# Patient Record
Sex: Female | Born: 1950
Health system: Southern US, Community
[De-identification: ages and names within clinical notes are randomized; demographics above are authoritative.]

## PROBLEM LIST (undated history)

## (undated) DIAGNOSIS — M1990 Unspecified osteoarthritis, unspecified site: Secondary | ICD-10-CM

## (undated) DIAGNOSIS — T8859XA Other complications of anesthesia, initial encounter: Secondary | ICD-10-CM

## (undated) DIAGNOSIS — I10 Essential (primary) hypertension: Secondary | ICD-10-CM

## (undated) DIAGNOSIS — Z9889 Other specified postprocedural states: Secondary | ICD-10-CM

## (undated) DIAGNOSIS — Z8489 Family history of other specified conditions: Secondary | ICD-10-CM

## (undated) DIAGNOSIS — T4145XA Adverse effect of unspecified anesthetic, initial encounter: Secondary | ICD-10-CM

## (undated) DIAGNOSIS — R112 Nausea with vomiting, unspecified: Secondary | ICD-10-CM

## (undated) HISTORY — PX: TUBAL LIGATION: SHX77

## (undated) HISTORY — DX: Essential (primary) hypertension: I10

---

## 1978-12-04 HISTORY — PX: OTHER SURGICAL HISTORY: SHX169

## 1997-11-03 ENCOUNTER — Other Ambulatory Visit: Admission: RE | Admit: 1997-11-03 | Discharge: 1997-11-03 | Payer: Self-pay | Admitting: Gynecology

## 1999-02-09 ENCOUNTER — Other Ambulatory Visit: Admission: RE | Admit: 1999-02-09 | Discharge: 1999-02-09 | Payer: Self-pay | Admitting: Gynecology

## 2002-09-23 ENCOUNTER — Other Ambulatory Visit: Admission: RE | Admit: 2002-09-23 | Discharge: 2002-09-23 | Payer: Self-pay | Admitting: Family Medicine

## 2003-12-02 ENCOUNTER — Other Ambulatory Visit: Admission: RE | Admit: 2003-12-02 | Discharge: 2003-12-02 | Payer: Self-pay | Admitting: Family Medicine

## 2003-12-24 ENCOUNTER — Encounter: Admission: RE | Admit: 2003-12-24 | Discharge: 2003-12-24 | Payer: Self-pay | Admitting: Family Medicine

## 2005-05-24 ENCOUNTER — Ambulatory Visit: Payer: Self-pay | Admitting: Family Medicine

## 2005-05-24 ENCOUNTER — Encounter: Payer: Self-pay | Admitting: Family Medicine

## 2005-05-24 ENCOUNTER — Other Ambulatory Visit: Admission: RE | Admit: 2005-05-24 | Discharge: 2005-05-24 | Payer: Self-pay | Admitting: Family Medicine

## 2005-06-02 ENCOUNTER — Ambulatory Visit: Payer: Self-pay | Admitting: Family Medicine

## 2006-09-22 DIAGNOSIS — I10 Essential (primary) hypertension: Secondary | ICD-10-CM | POA: Insufficient documentation

## 2006-09-27 ENCOUNTER — Ambulatory Visit: Payer: Self-pay | Admitting: Family Medicine

## 2006-09-27 DIAGNOSIS — M1710 Unilateral primary osteoarthritis, unspecified knee: Secondary | ICD-10-CM

## 2006-09-27 DIAGNOSIS — IMO0002 Reserved for concepts with insufficient information to code with codable children: Secondary | ICD-10-CM | POA: Insufficient documentation

## 2006-09-28 LAB — CONVERTED CEMR LAB
AST: 24 units/L (ref 0–37)
Albumin: 3.5 g/dL (ref 3.5–5.2)
Basophils Absolute: 0 10*3/uL (ref 0.0–0.1)
Basophils Relative: 0.4 % (ref 0.0–1.0)
CO2: 34 meq/L — ABNORMAL HIGH (ref 19–32)
Eosinophils Relative: 2.6 % (ref 0.0–5.0)
GFR calc Af Amer: 111 mL/min
Glucose, Bld: 99 mg/dL (ref 70–99)
HCT: 39.2 % (ref 36.0–46.0)
LDL Cholesterol: 111 mg/dL — ABNORMAL HIGH (ref 0–99)
MCHC: 33.9 g/dL (ref 30.0–36.0)
MCV: 90.2 fL (ref 78.0–100.0)
Monocytes Absolute: 0.4 10*3/uL (ref 0.2–0.7)
Monocytes Relative: 7.9 % (ref 3.0–11.0)
Neutro Abs: 3.2 10*3/uL (ref 1.4–7.7)
Potassium: 3.7 meq/L (ref 3.5–5.1)
Sodium: 145 meq/L (ref 135–145)
Total CHOL/HDL Ratio: 3.6
Total Protein: 6.8 g/dL (ref 6.0–8.3)
VLDL: 10 mg/dL (ref 0–40)
WBC: 5.5 10*3/uL (ref 4.5–10.5)

## 2006-09-29 ENCOUNTER — Encounter (INDEPENDENT_AMBULATORY_CARE_PROVIDER_SITE_OTHER): Payer: Self-pay | Admitting: *Deleted

## 2006-12-15 ENCOUNTER — Encounter: Payer: Self-pay | Admitting: Family Medicine

## 2006-12-27 ENCOUNTER — Encounter (INDEPENDENT_AMBULATORY_CARE_PROVIDER_SITE_OTHER): Payer: Self-pay | Admitting: *Deleted

## 2007-01-05 ENCOUNTER — Encounter: Payer: Self-pay | Admitting: Family Medicine

## 2007-10-29 ENCOUNTER — Encounter: Payer: Self-pay | Admitting: Family Medicine

## 2008-01-09 ENCOUNTER — Ambulatory Visit: Payer: Self-pay | Admitting: Family Medicine

## 2008-02-11 ENCOUNTER — Other Ambulatory Visit: Admission: RE | Admit: 2008-02-11 | Discharge: 2008-02-11 | Payer: Self-pay | Admitting: Family Medicine

## 2008-02-11 ENCOUNTER — Encounter: Payer: Self-pay | Admitting: Family Medicine

## 2008-02-11 ENCOUNTER — Ambulatory Visit: Payer: Self-pay | Admitting: Family Medicine

## 2008-02-11 DIAGNOSIS — Z780 Asymptomatic menopausal state: Secondary | ICD-10-CM | POA: Insufficient documentation

## 2008-02-11 DIAGNOSIS — L570 Actinic keratosis: Secondary | ICD-10-CM | POA: Insufficient documentation

## 2008-02-11 LAB — CONVERTED CEMR LAB
Bilirubin Urine: NEGATIVE
Ketones, urine, test strip: NEGATIVE
Protein, U semiquant: NEGATIVE
Specific Gravity, Urine: 1.01

## 2008-02-13 ENCOUNTER — Encounter (INDEPENDENT_AMBULATORY_CARE_PROVIDER_SITE_OTHER): Payer: Self-pay | Admitting: *Deleted

## 2008-02-15 ENCOUNTER — Encounter (INDEPENDENT_AMBULATORY_CARE_PROVIDER_SITE_OTHER): Payer: Self-pay | Admitting: *Deleted

## 2008-02-17 LAB — CONVERTED CEMR LAB
Alkaline Phosphatase: 58 units/L (ref 39–117)
Basophils Absolute: 0.1 10*3/uL (ref 0.0–0.1)
CO2: 34 meq/L — ABNORMAL HIGH (ref 19–32)
Calcium: 9.2 mg/dL (ref 8.4–10.5)
Cholesterol: 155 mg/dL (ref 0–200)
Creatinine, Ser: 0.7 mg/dL (ref 0.4–1.2)
Eosinophils Absolute: 0.2 10*3/uL (ref 0.0–0.7)
Eosinophils Relative: 2.2 % (ref 0.0–5.0)
GFR calc non Af Amer: 91 mL/min
Glucose, Bld: 99 mg/dL (ref 70–99)
HCT: 39.3 % (ref 36.0–46.0)
Lymphocytes Relative: 19.8 % (ref 12.0–46.0)
MCHC: 34.3 g/dL (ref 30.0–36.0)
MCV: 93.2 fL (ref 78.0–100.0)
Monocytes Relative: 3.5 % (ref 3.0–12.0)
Neutro Abs: 5.3 10*3/uL (ref 1.4–7.7)
Potassium: 3.9 meq/L (ref 3.5–5.1)
RDW: 11.6 % (ref 11.5–14.6)
Sodium: 143 meq/L (ref 135–145)
Total CHOL/HDL Ratio: 3.5
Total Protein: 7 g/dL (ref 6.0–8.3)
VLDL: 11 mg/dL (ref 0–40)
WBC: 7.3 10*3/uL (ref 4.5–10.5)

## 2008-02-18 ENCOUNTER — Encounter (INDEPENDENT_AMBULATORY_CARE_PROVIDER_SITE_OTHER): Payer: Self-pay | Admitting: *Deleted

## 2008-03-12 ENCOUNTER — Encounter: Admission: RE | Admit: 2008-03-12 | Discharge: 2008-03-12 | Payer: Self-pay | Admitting: Family Medicine

## 2008-03-17 ENCOUNTER — Encounter (INDEPENDENT_AMBULATORY_CARE_PROVIDER_SITE_OTHER): Payer: Self-pay | Admitting: *Deleted

## 2008-04-11 ENCOUNTER — Telehealth (INDEPENDENT_AMBULATORY_CARE_PROVIDER_SITE_OTHER): Payer: Self-pay | Admitting: *Deleted

## 2009-03-11 ENCOUNTER — Ambulatory Visit: Payer: Self-pay | Admitting: Family Medicine

## 2009-05-13 ENCOUNTER — Ambulatory Visit: Payer: Self-pay | Admitting: Family Medicine

## 2009-05-13 DIAGNOSIS — E559 Vitamin D deficiency, unspecified: Secondary | ICD-10-CM | POA: Insufficient documentation

## 2009-05-18 ENCOUNTER — Ambulatory Visit: Payer: Self-pay | Admitting: Family Medicine

## 2009-05-18 LAB — CONVERTED CEMR LAB
AST: 20 units/L (ref 0–37)
Basophils Absolute: 0 10*3/uL (ref 0.0–0.1)
Basophils Relative: 0 % (ref 0.0–3.0)
Creatinine, Ser: 0.7 mg/dL (ref 0.4–1.2)
Eosinophils Absolute: 0.2 10*3/uL (ref 0.0–0.7)
Eosinophils Relative: 3.4 % (ref 0.0–5.0)
Glucose, Bld: 101 mg/dL — ABNORMAL HIGH (ref 70–99)
HCT: 42.4 % (ref 36.0–46.0)
LDL Cholesterol: 122 mg/dL — ABNORMAL HIGH (ref 0–99)
Lymphs Abs: 1.7 10*3/uL (ref 0.7–4.0)
MCV: 92.4 fL (ref 78.0–100.0)
Neutro Abs: 2.7 10*3/uL (ref 1.4–7.7)
RBC: 4.58 M/uL (ref 3.87–5.11)
Sodium: 140 meq/L (ref 135–145)
Total Bilirubin: 0.5 mg/dL (ref 0.3–1.2)
Total Protein: 7.5 g/dL (ref 6.0–8.3)
VLDL: 12.2 mg/dL (ref 0.0–40.0)

## 2010-03-09 ENCOUNTER — Ambulatory Visit: Payer: Self-pay | Admitting: Family Medicine

## 2010-04-19 ENCOUNTER — Encounter: Payer: Self-pay | Admitting: Family Medicine

## 2010-04-25 ENCOUNTER — Encounter: Payer: Self-pay | Admitting: Family Medicine

## 2010-05-04 NOTE — Assessment & Plan Note (Signed)
Summary: FLU SHOT//PH  Nurse Visit  CC: Flu shot./kb   Allergies: No Known Drug Allergies  Orders Added: 1)  Admin 1st Vaccine [90471] 2)  Flu Vaccine 74yrs + [16109]         Flu Vaccine Consent Questions     Do you have a history of severe allergic reactions to this vaccine? no    Any prior history of allergic reactions to egg and/or gelatin? no    Do you have a sensitivity to the preservative Thimersol? no    Do you have a past history of Guillan-Barre Syndrome? no    Do you currently have an acute febrile illness? no    Have you ever had a severe reaction to latex? no    Vaccine information given and explained to patient? yes    Are you currently pregnant? no    Lot Number:[id number]   Exp Date:10/02/2010   Site Given  Left Deltoid IMu

## 2010-05-04 NOTE — Assessment & Plan Note (Signed)
Summary: cpx/ns/kdc   Vital Signs:  Patient profile:   60 year old female Height:      65 inches Weight:      181 pounds BMI:     30.23 Temp:     98.2 degrees F oral Pulse rate:   62 / minute Pulse rhythm:   regular BP sitting:   122 / 80  (left arm) Cuff size:   regular  Vitals Entered By: Army Fossa CMA (May 13, 2009 8:35 AM) CC: CPX, no pap, pt is fasting    History of Present Illness: Pt here for cpe and labs.   No pap-- pt had pap last year and wants to wait a year.    Hypertension follow-up      This is a 60 year old woman who presents for Hypertension follow-up.  The patient denies lightheadedness, urinary frequency, headaches, edema, impotence, rash, and fatigue.  The patient denies the following associated symptoms: chest pain, chest pressure, exercise intolerance, dyspnea, palpitations, syncope, leg edema, and pedal edema.  Compliance with medications (by patient report) has been near 100%.  The patient reports that dietary compliance has been good.  The patient reports exercising 3-4X per week.  Adjunctive measures currently used by the patient include salt restriction.    Preventive Screening-Counseling & Management  Alcohol-Tobacco     Alcohol drinks/day: 0     Smoking Status: never     Passive Smoke Exposure: no  Caffeine-Diet-Exercise     Caffeine use/day: 0     Does Patient Exercise: yes     Type of exercise: walk     Times/week: 5- when weather is good  Hep-HIV-STD-Contraception     HIV Risk: no     Dental Visit-last 6 months no     Dental Care Counseling: no ins     SBE monthly: no     SBE Education/Counseling: to perform regular SBE  Safety-Violence-Falls     Seat Belt Use: 100      Sexual History:  currently monogamous and married.    Current Medications (verified): 1)  Hydrochlorothiazide 25 Mg  Tabs (Hydrochlorothiazide) .Marland Kitchen.. 1 By Mouth Once Daily 2)  Calcium .Marland Kitchen.. 1 By Mouth Two Times A Day 3)  Basa .Marland Kitchen.. 1 By Mouth Qd 4)  Potassium  .Marland Kitchen.. 1 By Mouth Qd 5)  Fish Oil 1000 Mg Caps (Omega-3 Fatty Acids) .Marland Kitchen.. 1 By Mouth Daily 6)  Baby Aspirin 81 Mg Chew (Aspirin) .Marland Kitchen.. 1 By Mouth Daily 7)  Vitamin D 1000 Unit Tabs (Cholecalciferol) .Marland Kitchen.. 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 Family History Hypertension Family History Other cancer-Breast Family History of Stroke F 1st degree relative <60 Family History Thyroid disease Family History of CAD Female 1st degree relative <60 Family  History of Colon CA 1st degree relative <60  Social History: Reviewed history from 02/11/2008 and no changes required. 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 Eyes:  Denies blurring, discharge, double vision, eye irritation, eye pain, halos, itching,  light sensitivity, red eye, vision loss-1 eye, and vision loss-both eyes; optho- due. ENT:  Denies decreased hearing, difficulty swallowing, ear discharge, earache, hoarseness, nasal congestion, nosebleeds, postnasal drainage, ringing in ears, sinus pressure, and sore throat. CV:  Denies bluish discoloration of lips or nails, chest pain or discomfort, difficulty breathing at night, difficulty breathing while lying down, fainting, fatigue, leg cramps with exertion, lightheadness, near fainting, palpitations, shortness of breath with exertion, swelling of feet, swelling of hands, and weight gain. Resp:  Denies chest discomfort, chest pain with inspiration, cough, coughing up blood, excessive snoring, hypersomnolence, morning headaches, pleuritic, shortness of breath, sputum productive, and wheezing. GI:  Denies abdominal pain, bloody stools, change in bowel habits, constipation, dark tarry stools, diarrhea, excessive appetite, gas, hemorrhoids, indigestion, loss of appetite, nausea, vomiting, vomiting blood, and yellowish skin color. GU:  Denies abnormal vaginal bleeding, decreased libido, discharge, dysuria, genital sores, hematuria, incontinence, nocturia, urinary frequency, and urinary hesitancy. MS:  Denies joint pain, joint redness, joint swelling, loss of strength, low back pain, mid back pain, muscle aches, muscle , cramps, muscle weakness, stiffness, and thoracic pain. Derm:  Denies changes in color of skin, changes in nail beds, dryness, excessive perspiration, flushing, hair loss, insect bite(s), itching, lesion(s), poor wound healing, and rash. Neuro:  Denies brief paralysis, difficulty with concentration, disturbances in coordination, falling down, headaches, inability to speak, memory loss, numbness, poor balance, seizures, sensation of room spinning, tingling, tremors, visual disturbances, and weakness. Psych:  Denies alternate hallucination ( auditory/visual), anxiety, depression, easily  angered, easily tearful, irritability, mental problems, panic attacks, sense of great danger, suicidal thoughts/plans, thoughts of violence, unusual visions or sounds, and thoughts /plans of harming others. Endo:  Denies cold intolerance, excessive hunger, excessive thirst, excessive urination, heat intolerance, polyuria, and weight change. Heme:  Denies abnormal bruising, bleeding, enlarge lymph nodes, fevers, pallor, and skin discoloration. Allergy:  Denies hives or rash, itching eyes, persistent infections, seasonal allergies, and sneezing.  Physical Exam  General:  Well-developed,well-nourished,in no acute distress; alert,appropriate and cooperative throughout examination Head:  Normocephalic and atraumatic without obvious abnormalities. No apparent alopecia or balding. Eyes:  vision grossly intact, pupils equal, pupils round, pupils reactive to light, and no injection.   Ears:  External ear exam shows no significant lesions or deformities.  Otoscopic examination reveals clear canals, tympanic membranes are intact bilaterally without bulging, retraction, inflammation or discharge. Hearing is grossly normal bilaterally. Nose:  External nasal examination shows no deformity or inflammation. Nasal mucosa are pink and moist without lesions or exudates. Mouth:  Oral mucosa and oropharynx without lesions or exudates.  Teeth in good repair. Neck:  No deformities, masses, or tenderness noted. Breasts:  No mass, nodules, thickening, tenderness, bulging, retraction, inflamation, nipple discharge or skin changes noted.   Lungs:  Normal respiratory effort, chest expands symmetrically. Lungs are clear to auscultation, no crackles or wheezes. Heart:  normal rate and no murmur.   Abdomen:  Bowel sounds positive,abdomen soft  and non-tender without masses, organomegaly or hernias noted. Rectal:  refused Genitalia:  refused Msk:  normal ROM, no joint tenderness, no joint swelling, no joint warmth, no redness  over joints, no joint deformities, no joint instability, and no crepitation.   Pulses:  R posterior tibial normal, R dorsalis pedis normal, R carotid normal, L posterior tibial normal, L dorsalis pedis normal, and L carotid normal.   Extremities:  No clubbing, cyanosis, edema, or deformity noted with normal full range of motion of all joints.   Neurologic:  No cranial nerve deficits noted. Station and gait are normal. Plantar reflexes are down-going bilaterally. DTRs are symmetrical throughout. Sensory, motor and coordinative functions appear intact. Skin:  Intact without suspicious lesions or rashes Cervical Nodes:  No lymphadenopathy noted Axillary Nodes:  No palpable lymphadenopathy Psych:  Cognition and judgment appear intact. Alert and cooperative with normal attention span and concentration. No apparent delusions, illusions, hallucinations   Impression & Recommendations:  Problem # 1:  PREVENTIVE HEALTH CARE (ICD-V70.0)  bmd refused because ins will not pay for it [facility]  Orders: Venipuncture (98119) TLB-Lipid Panel (80061-LIPID) TLB-BMP (Basic Metabolic Panel-BMET) (80048-METABOL) TLB-CBC Platelet - w/Differential (85025-CBCD) TLB-Hepatic/Liver Function Pnl (80076-HEPATIC) EKG w/ Interpretation (93000)  Problem # 2:  UNSPECIFIED VITAMIN D DEFICIENCY (ICD-268.9) pt refusing vita D level because her ins did not pay for it Orders: Venipuncture (14782) TLB-Lipid Panel (80061-LIPID) TLB-BMP (Basic Metabolic Panel-BMET) (80048-METABOL) TLB-CBC Platelet - w/Differential (85025-CBCD) TLB-Hepatic/Liver Function Pnl (80076-HEPATIC) EKG w/ Interpretation (93000)  Problem # 3:  HYPERTENSION (ICD-401.9)  Her updated medication list for this problem includes:    Hydrochlorothiazide 25 Mg Tabs (Hydrochlorothiazide) .Marland Kitchen... 1 by mouth once daily  Orders: Venipuncture (95621) TLB-Lipid Panel (80061-LIPID) TLB-BMP (Basic Metabolic Panel-BMET) (80048-METABOL) TLB-CBC Platelet -  w/Differential (85025-CBCD) TLB-Hepatic/Liver Function Pnl (80076-HEPATIC) EKG w/ Interpretation (93000)  BP today: 122/80 Prior BP: 130/78 (02/11/2008)  Labs Reviewed: K+: 3.9 (02/11/2008) Creat: : 0.7 (02/11/2008)   Chol: 155 (02/11/2008)   HDL: 44.3 (02/11/2008)   LDL: 100 (02/11/2008)   TG: 53 (02/11/2008)  Problem # 4:  POSTMENOPAUSAL STATUS (ICD-V49.81)  Orders: EKG w/ Interpretation (93000)  Complete Medication List: 1)  Hydrochlorothiazide 25 Mg Tabs (Hydrochlorothiazide) .Marland Kitchen.. 1 by mouth once daily 2)  Calcium  .Marland KitchenMarland Kitchen. 1 by mouth two times a day 3)  Basa  .Marland Kitchen.. 1 by mouth qd 4)  Potassium  .Marland KitchenMarland Kitchen. 1 by mouth qd 5)  Fish Oil 1000 Mg Caps (Omega-3 fatty acids) .Marland Kitchen.. 1 by mouth daily 6)  Baby Aspirin 81 Mg Chew (Aspirin) .Marland Kitchen.. 1 by mouth daily 7)  Vitamin D 1000 Unit Tabs (Cholecalciferol) .Marland Kitchen.. 1 by mouth daily Prescriptions: HYDROCHLOROTHIAZIDE 25 MG  TABS (HYDROCHLOROTHIAZIDE) 1 by mouth once daily  #100 x 3   Entered and Authorized by:   Loreen Freud DO   Signed by:   Loreen Freud DO on 05/13/2009   Method used:   Electronically to        [facility] (retail)       [address]       [place], [place]  [address]       Ph: [phone]       Fax: [phone]   RxID:   [id number]    EKG  Procedure date:  05/13/2009  Findings:      Normal sinus rhythm with rate of:  71 bpm      Colonoscopy Next Due:  Refused Last PAP:  NEGATIVE FOR  INTRAEPITHELIAL LESIONS OR MALIGNANCY. (02/11/2008 12:00:00 AM) PAP Next Due:  Refused  Appended Document: cpx/ns/kdc  Laboratory Results   Urine Tests   Date/Time Reported: May 13, 2009 9:30 AM   Routine Urinalysis   Color: yellow Appearance: Clear Glucose: negative   (Normal Range: Negative) Bilirubin: negative   (Normal Range: Negative) Ketone: negative   (Normal Range: Negative) Spec. Gravity: 1.020   (Normal Range: 1.003-1.035) Blood: negative   (Normal Range: Negative) pH: 6.0    (Normal Range: 5.0-8.0) Protein: negative   (Normal Range: Negative) Urobilinogen: negative   (Normal Range: 0-1) Nitrite: negative   (Normal Range: Negative) Leukocyte Esterace: negative   (Normal Range: Negative)    Comments: Floydene Flock  May 13, 2009 9:30 AM

## 2010-05-12 NOTE — Letter (Signed)
Summary: Patient Overdue for Mammogram/[employer]  Patient Overdue for Mammogram/[employer]   Imported By: Lanelle Bal 05/03/2010 11:21:03  _____________________________________________________________________  External Attachment:    Type:   Image     Comment:   External Document  Appended Document: Patient Overdue for Mammogram/[employer] does pt have cpe scheduled?  due in Feb Let pt know ins co sent [place] letter saying her mammo was overdue  Appended Document: Patient Overdue for Mammogram/[employer] pt aware and stated she would call and schedule

## 2010-07-22 ENCOUNTER — Other Ambulatory Visit: Payer: Self-pay

## 2010-07-22 MED ORDER — HYDROCHLOROTHIAZIDE 25 MG PO TABS: 25.0000 mg | ORAL_TABLET | Freq: Every day | ORAL | Status: DC

## 2010-11-08 ENCOUNTER — Other Ambulatory Visit: Payer: Self-pay | Admitting: Family Medicine

## 2010-11-08 DIAGNOSIS — Z1231 Encounter for screening mammogram for malignant neoplasm of breast: Secondary | ICD-10-CM

## 2010-11-09 MED ORDER — HYDROCHLOROTHIAZIDE 25 MG PO TABS: 25.0000 mg | ORAL_TABLET | Freq: Every day | ORAL | Status: DC

## 2010-11-09 NOTE — Telephone Encounter (Signed)
CPX scheduled    KP

## 2010-12-02 ENCOUNTER — Ambulatory Visit
Admission: RE | Admit: 2010-12-02 | Discharge: 2010-12-02 | Disposition: A | Discharge status: Home / Self Care | Payer: Commercial Indemnity | Source: Ambulatory Visit | Attending: Family Medicine | Admitting: Family Medicine

## 2010-12-02 DIAGNOSIS — Z1231 Encounter for screening mammogram for malignant neoplasm of breast: Secondary | ICD-10-CM

## 2010-12-21 ENCOUNTER — Other Ambulatory Visit (HOSPITAL_COMMUNITY)
Admission: RE | Admit: 2010-12-21 | Discharge: 2010-12-21 | Disposition: A | Discharge status: Home / Self Care | Payer: Commercial Indemnity | Source: Ambulatory Visit | Attending: Family Medicine | Admitting: Family Medicine

## 2010-12-21 ENCOUNTER — Ambulatory Visit (INDEPENDENT_AMBULATORY_CARE_PROVIDER_SITE_OTHER): Payer: Commercial Indemnity | Admitting: Family Medicine

## 2010-12-21 ENCOUNTER — Encounter: Payer: Self-pay | Admitting: Family Medicine

## 2010-12-21 VITALS — BP 144/82 | HR 75 | Temp 98.7°F | Wt 183.0 lb

## 2010-12-21 DIAGNOSIS — Z0000 Encounter for general adult medical examination without abnormal findings: Secondary | ICD-10-CM

## 2010-12-21 DIAGNOSIS — I10 Essential (primary) hypertension: Secondary | ICD-10-CM

## 2010-12-21 DIAGNOSIS — Z01419 Encounter for gynecological examination (general) (routine) without abnormal findings: Secondary | ICD-10-CM | POA: Insufficient documentation

## 2010-12-21 LAB — CBC WITH DIFFERENTIAL/PLATELET
Basophils Absolute: 0 10*3/uL (ref 0.0–0.1)
Basophils Relative: 0.4 % (ref 0.0–3.0)
Eosinophils Absolute: 0.2 10*3/uL (ref 0.0–0.7)
Eosinophils Relative: 3.3 % (ref 0.0–5.0)
HCT: 39.3 % (ref 36.0–46.0)
Lymphocytes Relative: 31.1 % (ref 12.0–46.0)
MCHC: 33.3 g/dL (ref 30.0–36.0)
Monocytes Absolute: 0.5 10*3/uL (ref 0.1–1.0)
Monocytes Relative: 8.4 % (ref 3.0–12.0)
Neutrophils Relative %: 56.8 % (ref 43.0–77.0)
Platelets: 172 10*3/uL (ref 150.0–400.0)
RDW: 13.2 % (ref 11.5–14.6)
WBC: 6 10*3/uL (ref 4.5–10.5)

## 2010-12-21 LAB — HEPATIC FUNCTION PANEL
ALT: 14 U/L (ref 0–35)
Albumin: 3.7 g/dL (ref 3.5–5.2)
Total Protein: 7 g/dL (ref 6.0–8.3)

## 2010-12-21 LAB — POCT URINALYSIS DIPSTICK
Ketones, UA: NEGATIVE
Leukocytes, UA: NEGATIVE
pH, UA: 8

## 2010-12-21 LAB — TSH: TSH: 1.31 u[IU]/mL (ref 0.35–5.50)

## 2010-12-21 LAB — BASIC METABOLIC PANEL
Creatinine, Ser: 0.7 mg/dL (ref 0.4–1.2)
Glucose, Bld: 103 mg/dL — ABNORMAL HIGH (ref 70–99)

## 2010-12-21 LAB — LIPID PANEL: Total CHOL/HDL Ratio: 3

## 2010-12-21 NOTE — Progress Notes (Signed)
Subjective:     Mindy Anderson is a 60 y.o. female and is here for a comprehensive physical exam. The patient reports no problems.  History   Social History  . Marital Status: Single    Spouse Name: N/A    Number of Children: N/A  . Years of Education: N/A   Occupational History  . Not on file.   Social History Main Topics  . Smoking status: Never Smoker   . Smokeless tobacco: Never Used  . Alcohol Use: Not on file  . Drug Use: No  . Sexually Active: Yes -- Female partner(s)   Other Topics Concern  . Not on file   Social History Narrative  . No narrative on file   Health Maintenance  Topic Date Due  . Pap Smear  10/05/1966  . Colonoscopy  10/05/1998  . Influenza Vaccine  01/03/2011  . Mammogram  12/01/2012  . Tetanus/tdap  02/10/2018  . Zostavax  Completed    Past Medical History  Diagnosis Date  . Hypertension    Family History  Problem Relation Age of Onset  . Cancer Mother   . Cancer Sister   History reviewed. No pertinent past surgical history. History   Social History  . Marital Status: Single    Spouse Name: N/A    Number of Children: N/A  . Years of Education: N/A   Occupational History  . Not on file.   Social History Main Topics  . Smoking status: Never Smoker   . Smokeless tobacco: Never Used  . Alcohol Use: Not on file  . Drug Use: No  . Sexually Active: Yes -- Female partner(s)   Other Topics Concern  . Not on file   Social History Narrative  . No narrative on file   Review of Systems Review of Systems  Constitutional: Negative for activity change, appetite change and fatigue.  HENT: Negative for hearing loss, congestion, tinnitus and ear discharge.  dentist q110m Eyes: Negative for visual disturbance (see optho q1y -- vision corrected to 20/20 with glasses).  Respiratory: Negative for cough, chest tightness and shortness of breath.   Cardiovascular: Negative for chest pain, palpitations and leg swelling.  Gastrointestinal: Negative for  abdominal pain, diarrhea, constipation and abdominal distention.  Genitourinary: Negative for urgency, frequency, decreased urine volume and difficulty urinating.  Musculoskeletal: Negative for back pain, arthralgias and gait problem.  Skin: Negative for color change, pallor and rash.  Neurological: Negative for dizziness, light-headedness, numbness and headaches.  Hematological: Negative for adenopathy. Does not bruise/bleed easily.  Psychiatric/Behavioral: Negative for suicidal ideas, confusion, sleep disturbance, self-injury, dysphoric mood, decreased concentration and agitation.        Objective:    BP 144/82  Pulse 75  Temp(Src) 98.7 F (37.1 C) (Oral)  Wt 183 lb (83.008 kg)  SpO2 96%  General Appearance:    Alert, cooperative, no distress, appears stated age  Head:    Normocephalic, without obvious abnormality, atraumatic  Eyes:    PERRL, conjunctiva/corneas clear, EOM's intact, fundi    benign, both eyes  Ears:    Normal TM's and external ear canals, both ears  Nose:   Nares normal, septum midline, mucosa normal, no drainage    or sinus tenderness  Throat:   Lips, mucosa, and tongue normal; teeth and gums normal  Neck:   Supple, symmetrical, trachea midline, no adenopathy;    thyroid:  no enlargement/tenderness/nodules; no carotid   bruit or JVD  Back:     Symmetric, no curvature, ROM  normal, no CVA tenderness  Lungs:     Clear to auscultation bilaterally, respirations unlabored  Chest Wall:    No tenderness or deformity   Heart:    Regular rate and rhythm, S1 and S2 normal, no murmur, rub   or gallop  Breast Exam:    No tenderness, masses, or nipple abnormality  Abdomen:     Soft, non-tender, bowel sounds active all four quadrants,    no masses, no organomegaly  Genitalia:    Normal female without lesion, discharge or tenderness-- pap done  Rectal:    Normal tone, normal prostate, no masses or tenderness;   guaiac negative stool  Extremities:   Extremities normal,  atraumatic, no cyanosis or edema  Pulses:   2+ and symmetric all extremities  Skin:   Skin color, texture, turgor normal, no rashes or lesions  Lymph nodes:   Cervical, supraclavicular, and axillary nodes normal  Neurologic:   CNII-XII intact, normal strength, sensation and reflexes    throughout     Assessment:    Healthy female exam. HTN-- con't meds   Plan:  ghm utd mammo done  bmd due===do next aug with mammo Colon---pt declined Check fasting labs   See After Visit Summary for Counseling Recommendations

## 2010-12-21 NOTE — Patient Instructions (Signed)

## 2010-12-29 ENCOUNTER — Encounter: Payer: Self-pay | Admitting: Family Medicine

## 2011-02-09 ENCOUNTER — Ambulatory Visit (INDEPENDENT_AMBULATORY_CARE_PROVIDER_SITE_OTHER): Payer: Commercial Indemnity

## 2011-02-09 DIAGNOSIS — Z23 Encounter for immunization: Secondary | ICD-10-CM

## 2011-03-03 ENCOUNTER — Other Ambulatory Visit: Payer: Self-pay | Admitting: Family Medicine

## 2011-03-03 MED ORDER — HYDROCHLOROTHIAZIDE 25 MG PO TABS: 25.0000 mg | ORAL_TABLET | Freq: Every day | ORAL | Status: DC

## 2011-03-03 NOTE — Telephone Encounter (Signed)
Faxed.   KP 

## 2011-12-22 ENCOUNTER — Other Ambulatory Visit: Payer: Self-pay | Admitting: Family Medicine

## 2011-12-22 DIAGNOSIS — Z1231 Encounter for screening mammogram for malignant neoplasm of breast: Secondary | ICD-10-CM

## 2011-12-26 ENCOUNTER — Ambulatory Visit
Admission: RE | Admit: 2011-12-26 | Discharge: 2011-12-26 | Disposition: A | Discharge status: Home / Self Care | Payer: Commercial Indemnity | Source: Ambulatory Visit | Attending: Family Medicine | Admitting: Family Medicine

## 2011-12-26 DIAGNOSIS — Z1231 Encounter for screening mammogram for malignant neoplasm of breast: Secondary | ICD-10-CM

## 2011-12-27 ENCOUNTER — Telehealth: Payer: Self-pay | Admitting: Family Medicine

## 2011-12-27 MED ORDER — HYDROCHLOROTHIAZIDE 25 MG PO TABS: 25.0000 mg | ORAL_TABLET | Freq: Every day | ORAL | Status: DC

## 2011-12-27 NOTE — Telephone Encounter (Signed)
Refill: Hydrochlorothiazide 25mg  tab #90. Take 1 tablet by mouth once daily.

## 2012-03-15 ENCOUNTER — Encounter: Payer: Commercial Indemnity | Admitting: Family Medicine

## 2012-04-02 ENCOUNTER — Encounter: Payer: Self-pay | Admitting: Family Medicine

## 2012-04-02 ENCOUNTER — Ambulatory Visit (INDEPENDENT_AMBULATORY_CARE_PROVIDER_SITE_OTHER): Payer: Commercial Indemnity | Admitting: Family Medicine

## 2012-04-02 VITALS — BP 134/76 | HR 85 | Temp 98.5°F | Ht 64.0 in | Wt 187.8 lb

## 2012-04-02 DIAGNOSIS — Z0000 Encounter for general adult medical examination without abnormal findings: Secondary | ICD-10-CM

## 2012-04-02 DIAGNOSIS — I10 Essential (primary) hypertension: Secondary | ICD-10-CM

## 2012-04-02 LAB — BASIC METABOLIC PANEL
BUN: 11 mg/dL (ref 6–23)
CO2: 31 mEq/L (ref 19–32)
GFR: 91.19 mL/min (ref >60.00)
Glucose, Bld: 104 mg/dL — ABNORMAL HIGH (ref 70–99)
Sodium: 141 mEq/L (ref 135–145)

## 2012-04-02 LAB — POCT URINALYSIS DIPSTICK
Bilirubin, UA: NEGATIVE
Blood, UA: NEGATIVE
Leukocytes, UA: NEGATIVE
Nitrite, UA: NEGATIVE
Urobilinogen, UA: 0.2
pH, UA: 7

## 2012-04-02 LAB — LIPID PANEL
Cholesterol: 177 mg/dL (ref 0–200)
Total CHOL/HDL Ratio: 3

## 2012-04-02 LAB — MICROALBUMIN / CREATININE URINE RATIO
Creatinine,U: 209.6 mg/dL
Microalb Creat Ratio: 0.4 mg/g (ref 0.0–30.0)
Microalb, Ur: 0.8 mg/dL (ref 0.0–1.9)

## 2012-04-02 MED ORDER — HYDROCHLOROTHIAZIDE 25 MG PO TABS: 25.0000 mg | ORAL_TABLET | Freq: Every day | ORAL | Status: DC

## 2012-04-02 NOTE — Patient Instructions (Addendum)
Preventive Care for Adults, Female A healthy lifestyle and preventive care can promote health and wellness. Preventive health guidelines for women include the following key practices.  A routine yearly physical is a good way to check with your caregiver about your health and preventive screening. It is a chance to share any concerns and updates on your health, and to receive a thorough exam.  Visit your dentist for a routine exam and preventive care every 6 months. Brush your teeth twice a day and floss once a day. Good oral hygiene prevents tooth decay and gum disease.  The frequency of eye exams is based on your age, health, family medical history, use of contact lenses, and other factors. Follow your caregiver's recommendations for frequency of eye exams.  Eat a healthy diet. Foods like vegetables, fruits, whole grains, low-fat dairy products, and lean protein foods contain the nutrients you need without too many calories. Decrease your intake of foods high in solid fats, added sugars, and salt. Eat the right amount of calories for you.Get information about a proper diet from your caregiver, if necessary.  Regular physical exercise is one of the most important things you can do for your health. Most adults should get at least 150 minutes of moderate-intensity exercise (any activity that increases your heart rate and causes you to sweat) each week. In addition, most adults need muscle-strengthening exercises on 2 or more days a week.  Maintain a healthy weight. The body mass index (BMI) is a screening tool to identify possible weight problems. It provides an estimate of body fat based on height and weight. Your caregiver can help determine your BMI, and can help you achieve or maintain a healthy weight.For adults 20 years and older:  A BMI below 18.5 is considered underweight.  A BMI of 18.5 to 24.9 is normal.  A BMI of 25 to 29.9 is considered overweight.  A BMI of 30 and above is  considered obese.  Maintain normal blood lipids and cholesterol levels by exercising and minimizing your intake of saturated fat. Eat a balanced diet with plenty of fruit and vegetables. Blood tests for lipids and cholesterol should begin at age 20 and be repeated every 5 years. If your lipid or cholesterol levels are high, you are over 50, or you are at high risk for heart disease, you may need your cholesterol levels checked more frequently.Ongoing high lipid and cholesterol levels should be treated with medicines if diet and exercise are not effective.  If you smoke, find out from your caregiver how to quit. If you do not use tobacco, do not start.  If you are pregnant, do not drink alcohol. If you are breastfeeding, be very cautious about drinking alcohol. If you are not pregnant and choose to drink alcohol, do not exceed 1 drink per day. One drink is considered to be 12 ounces (355 mL) of beer, 5 ounces (148 mL) of wine, or 1.5 ounces (44 mL) of liquor.  Avoid use of street drugs. Do not share needles with anyone. Ask for help if you need support or instructions about stopping the use of drugs.  High blood pressure causes heart disease and increases the risk of stroke. Your blood pressure should be checked at least every 1 to 2 years. Ongoing high blood pressure should be treated with medicines if weight loss and exercise are not effective.  If you are 55 to 63 years old, ask your caregiver if you should take aspirin to prevent strokes.  Diabetes   screening involves taking a blood sample to check your fasting blood sugar level. This should be done once every 3 years, after age 45, if you are within normal weight and without risk factors for diabetes. Testing should be considered at a younger age or be carried out more frequently if you are overweight and have at least 1 risk factor for diabetes.  Breast cancer screening is essential preventive care for women. You should practice "breast  self-awareness." This means understanding the normal appearance and feel of your breasts and may include breast self-examination. Any changes detected, no matter how small, should be reported to a caregiver. Women in their 20s and 30s should have a clinical breast exam (CBE) by a caregiver as part of a regular health exam every 1 to 3 years. After age 40, women should have a CBE every year. Starting at age 40, women should consider having a mammography (breast X-ray test) every year. Women who have a family history of breast cancer should talk to their caregiver about genetic screening. Women at a high risk of breast cancer should talk to their caregivers about having magnetic resonance imaging (MRI) and a mammography every year.  The Pap test is a screening test for cervical cancer. A Pap test can show cell changes on the cervix that might become cervical cancer if left untreated. A Pap test is a procedure in which cells are obtained and examined from the lower end of the uterus (cervix).  Women should have a Pap test starting at age 21.  Between ages 21 and 29, Pap tests should be repeated every 2 years.  Beginning at age 30, you should have a Pap test every 3 years as long as the past 3 Pap tests have been normal.  Some women have medical problems that increase the chance of getting cervical cancer. Talk to your caregiver about these problems. It is especially important to talk to your caregiver if a new problem develops soon after your last Pap test. In these cases, your caregiver may recommend more frequent screening and Pap tests.  The above recommendations are the same for women who have or have not gotten the vaccine for human papillomavirus (HPV).  If you had a hysterectomy for a problem that was not cancer or a condition that could lead to cancer, then you no longer need Pap tests. Even if you no longer need a Pap test, a regular exam is a good idea to make sure no other problems are  starting.  If you are between ages 65 and 70, and you have had normal Pap tests going back 10 years, you no longer need Pap tests. Even if you no longer need a Pap test, a regular exam is a good idea to make sure no other problems are starting.  If you have had past treatment for cervical cancer or a condition that could lead to cancer, you need Pap tests and screening for cancer for at least 20 years after your treatment.  If Pap tests have been discontinued, risk factors (such as a new sexual partner) need to be reassessed to determine if screening should be resumed.  The HPV test is an additional test that may be used for cervical cancer screening. The HPV test looks for the virus that can cause the cell changes on the cervix. The cells collected during the Pap test can be tested for HPV. The HPV test could be used to screen women aged 30 years and older, and should   be used in women of any age who have unclear Pap test results. After the age of 30, women should have HPV testing at the same frequency as a Pap test.  Colorectal cancer can be detected and often prevented. Most routine colorectal cancer screening begins at the age of 50 and continues through age 75. However, your caregiver may recommend screening at an earlier age if you have risk factors for colon cancer. On a yearly basis, your caregiver may provide home test kits to check for hidden blood in the stool. Use of a small camera at the end of a tube, to directly examine the colon (sigmoidoscopy or colonoscopy), can detect the earliest forms of colorectal cancer. Talk to your caregiver about this at age 50, when routine screening begins. Direct examination of the colon should be repeated every 5 to 10 years through age 75, unless early forms of pre-cancerous polyps or small growths are found.  Hepatitis C blood testing is recommended for all people born from 1945 through 1965 and any individual with known risks for hepatitis C.  Practice  safe sex. Use condoms and avoid high-risk sexual practices to reduce the spread of sexually transmitted infections (STIs). STIs include gonorrhea, chlamydia, syphilis, trichomonas, herpes, HPV, and human immunodeficiency virus (HIV). Herpes, HIV, and HPV are viral illnesses that have no cure. They can result in disability, cancer, and death. Sexually active women aged 25 and younger should be checked for chlamydia. Older women with new or multiple partners should also be tested for chlamydia. Testing for other STIs is recommended if you are sexually active and at increased risk.  Osteoporosis is a disease in which the bones lose minerals and strength with aging. This can result in serious bone fractures. The risk of osteoporosis can be identified using a bone density scan. Women ages 65 and over and women at risk for fractures or osteoporosis should discuss screening with their caregivers. Ask your caregiver whether you should take a calcium supplement or vitamin D to reduce the rate of osteoporosis.  Menopause can be associated with physical symptoms and risks. Hormone replacement therapy is available to decrease symptoms and risks. You should talk to your caregiver about whether hormone replacement therapy is right for you.  Use sunscreen with sun protection factor (SPF) of 30 or more. Apply sunscreen liberally and repeatedly throughout the day. You should seek shade when your shadow is shorter than you. Protect yourself by wearing long sleeves, pants, a wide-brimmed hat, and sunglasses year round, whenever you are outdoors.  Once a month, do a whole body skin exam, using a mirror to look at the skin on your back. Notify your caregiver of new moles, moles that have irregular borders, moles that are larger than a pencil eraser, or moles that have changed in shape or color.  Stay current with required immunizations.  Influenza. You need a dose every fall (or winter). The composition of the flu vaccine  changes each year, so being vaccinated once is not enough.  Pneumococcal polysaccharide. You need 1 to 2 doses if you smoke cigarettes or if you have certain chronic medical conditions. You need 1 dose at age 65 (or older) if you have never been vaccinated.  Tetanus, diphtheria, pertussis (Tdap, Td). Get 1 dose of Tdap vaccine if you are younger than age 65, are over 65 and have contact with an infant, are a healthcare worker, are pregnant, or simply want to be protected from whooping cough. After that, you need a Td   booster dose every 10 years. Consult your caregiver if you have not had at least 3 tetanus and diphtheria-containing shots sometime in your life or have a deep or dirty wound.  HPV. You need this vaccine if you are a woman age 26 or younger. The vaccine is given in 3 doses over 6 months.  Measles, mumps, rubella (MMR). You need at least 1 dose of MMR if you were born in 1957 or later. You may also need a second dose.  Meningococcal. If you are age 19 to 21 and a first-year college student living in a residence hall, or have one of several medical conditions, you need to get vaccinated against meningococcal disease. You may also need additional booster doses.  Zoster (shingles). If you are age 60 or older, you should get this vaccine.  Varicella (chickenpox). If you have never had chickenpox or you were vaccinated but received only 1 dose, talk to your caregiver to find out if you need this vaccine.  Hepatitis A. You need this vaccine if you have a specific risk factor for hepatitis A virus infection or you simply wish to be protected from this disease. The vaccine is usually given as 2 doses, 6 to 18 months apart.  Hepatitis B. You need this vaccine if you have a specific risk factor for hepatitis B virus infection or you simply wish to be protected from this disease. The vaccine is given in 3 doses, usually over 6 months. Preventive Services / Frequency Ages 19 to 39  Blood  pressure check.** / Every 1 to 2 years.  Lipid and cholesterol check.** / Every 5 years beginning at age 20.  Clinical breast exam.** / Every 3 years for women in their 20s and 30s.  Pap test.** / Every 2 years from ages 21 through 29. Every 3 years starting at age 30 through age 65 or 70 with a history of 3 consecutive normal Pap tests.  HPV screening.** / Every 3 years from ages 30 through ages 65 to 70 with a history of 3 consecutive normal Pap tests.  Hepatitis C blood test.** / For any individual with known risks for hepatitis C.  Skin self-exam. / Monthly.  Influenza immunization.** / Every year.  Pneumococcal polysaccharide immunization.** / 1 to 2 doses if you smoke cigarettes or if you have certain chronic medical conditions.  Tetanus, diphtheria, pertussis (Tdap, Td) immunization. / A one-time dose of Tdap vaccine. After that, you need a Td booster dose every 10 years.  HPV immunization. / 3 doses over 6 months, if you are 26 and younger.  Measles, mumps, rubella (MMR) immunization. / You need at least 1 dose of MMR if you were born in 1957 or later. You may also need a second dose.  Meningococcal immunization. / 1 dose if you are age 19 to 21 and a first-year college student living in a residence hall, or have one of several medical conditions, you need to get vaccinated against meningococcal disease. You may also need additional booster doses.  Varicella immunization.** / Consult your caregiver.  Hepatitis A immunization.** / Consult your caregiver. 2 doses, 6 to 18 months apart.  Hepatitis B immunization.** / Consult your caregiver. 3 doses usually over 6 months. Ages 40 to 64  Blood pressure check.** / Every 1 to 2 years.  Lipid and cholesterol check.** / Every 5 years beginning at age 20.  Clinical breast exam.** / Every year after age 40.  Mammogram.** / Every year beginning at age 40   and continuing for as long as you are in good health. Consult with your  caregiver.  Pap test.** / Every 3 years starting at age 30 through age 65 or 70 with a history of 3 consecutive normal Pap tests.  HPV screening.** / Every 3 years from ages 30 through ages 65 to 70 with a history of 3 consecutive normal Pap tests.  Fecal occult blood test (FOBT) of stool. / Every year beginning at age 50 and continuing until age 75. You may not need to do this test if you get a colonoscopy every 10 years.  Flexible sigmoidoscopy or colonoscopy.** / Every 5 years for a flexible sigmoidoscopy or every 10 years for a colonoscopy beginning at age 50 and continuing until age 75.  Hepatitis C blood test.** / For all people born from 1945 through 1965 and any individual with known risks for hepatitis C.  Skin self-exam. / Monthly.  Influenza immunization.** / Every year.  Pneumococcal polysaccharide immunization.** / 1 to 2 doses if you smoke cigarettes or if you have certain chronic medical conditions.  Tetanus, diphtheria, pertussis (Tdap, Td) immunization.** / A one-time dose of Tdap vaccine. After that, you need a Td booster dose every 10 years.  Measles, mumps, rubella (MMR) immunization. / You need at least 1 dose of MMR if you were born in 1957 or later. You may also need a second dose.  Varicella immunization.** / Consult your caregiver.  Meningococcal immunization.** / Consult your caregiver.  Hepatitis A immunization.** / Consult your caregiver. 2 doses, 6 to 18 months apart.  Hepatitis B immunization.** / Consult your caregiver. 3 doses, usually over 6 months. Ages 65 and over  Blood pressure check.** / Every 1 to 2 years.  Lipid and cholesterol check.** / Every 5 years beginning at age 20.  Clinical breast exam.** / Every year after age 40.  Mammogram.** / Every year beginning at age 40 and continuing for as long as you are in good health. Consult with your caregiver.  Pap test.** / Every 3 years starting at age 30 through age 65 or 70 with a 3  consecutive normal Pap tests. Testing can be stopped between 65 and 70 with 3 consecutive normal Pap tests and no abnormal Pap or HPV tests in the past 10 years.  HPV screening.** / Every 3 years from ages 30 through ages 65 or 70 with a history of 3 consecutive normal Pap tests. Testing can be stopped between 65 and 70 with 3 consecutive normal Pap tests and no abnormal Pap or HPV tests in the past 10 years.  Fecal occult blood test (FOBT) of stool. / Every year beginning at age 50 and continuing until age 75. You may not need to do this test if you get a colonoscopy every 10 years.  Flexible sigmoidoscopy or colonoscopy.** / Every 5 years for a flexible sigmoidoscopy or every 10 years for a colonoscopy beginning at age 50 and continuing until age 75.  Hepatitis C blood test.** / For all people born from 1945 through 1965 and any individual with known risks for hepatitis C.  Osteoporosis screening.** / A one-time screening for women ages 65 and over and women at risk for fractures or osteoporosis.  Skin self-exam. / Monthly.  Influenza immunization.** / Every year.  Pneumococcal polysaccharide immunization.** / 1 dose at age 65 (or older) if you have never been vaccinated.  Tetanus, diphtheria, pertussis (Tdap, Td) immunization. / A one-time dose of Tdap vaccine if you are over   65 and have contact with an infant, are a healthcare worker, or simply want to be protected from whooping cough. After that, you need a Td booster dose every 10 years.  Varicella immunization.** / Consult your caregiver.  Meningococcal immunization.** / Consult your caregiver.  Hepatitis A immunization.** / Consult your caregiver. 2 doses, 6 to 18 months apart.  Hepatitis B immunization.** / Check with your caregiver. 3 doses, usually over 6 months. ** Family history and personal history of risk and conditions may change your caregiver's recommendations. Document Released: 05/17/2001 Document Revised: 06/13/2011  Document Reviewed: 08/16/2010 [employer] Patient Information 2013 [employer].  

## 2012-04-02 NOTE — Assessment & Plan Note (Signed)
Stable con't meds 

## 2012-04-02 NOTE — Progress Notes (Signed)
Subjective:     Mindy Anderson is a 61 y.o. female and is here for a comprehensive physical exam. The patient reports no problems.  History   Social History  . Marital Status: Married    Spouse Name: N/A    Number of Children: N/A  . Years of Education: N/A   Occupational History  . Not on file.   Social History Main Topics  . Smoking status: Never Smoker   . Smokeless tobacco: Never Used  . Alcohol Use: No  . Drug Use: No  . Sexually Active: Yes -- Female partner(s)   Other Topics Concern  . Not on file   Social History Narrative   Exercise-- no   Health Maintenance  Topic Date Due  . Colonoscopy  10/05/1998  . Influenza Vaccine  12/03/2012  . Pap Smear  12/20/2013  . Mammogram  12/25/2013  . Tetanus/tdap  02/10/2018  . Zostavax  Completed    The following portions of the patient's history were reviewed and updated as appropriate:  She  has a past medical history of Hypertension. She  does not have any pertinent problems on file. She  has no past surgical history on file. Her family history includes COPD in her father; Cancer in her mother and sister; Diabetes in her sister; Heart disease in her brother; Heart disease (age of onset:50) in her father; and Heart disease (age of onset:96) in her mother. She  reports that she has never smoked. She has never used smokeless tobacco. She reports that she does not drink alcohol or use illicit drugs. She has a current medication list which includes the following prescription(s): aspirin, calcium carbonate, vitamin d, fish oil-omega-3 fatty acids, hydrochlorothiazide, and potassium. Current Outpatient Prescriptions on File Prior to Visit  Medication Sig Dispense Refill  . aspirin 81 MG tablet Take 81 mg by mouth daily.        . calcium carbonate (OS-CAL) 600 MG TABS Take 600 mg by mouth 2 (two) times daily with a meal.        . Cholecalciferol (VITAMIN D) 2000 UNITS tablet Take 2,000 Units by mouth daily.        . fish oil-omega-3  fatty acids 1000 MG capsule Take 1 g by mouth daily.        . hydrochlorothiazide (HYDRODIURIL) 25 MG tablet Take 1 tablet (25 mg total) by mouth daily.  90 tablet  3  . Potassium 95 MG TABS Take 1 tablet by mouth daily.         She  has no known allergies. .  Review of Systems Review of Systems  Constitutional: Negative for activity change, appetite change and fatigue.  HENT: Negative for hearing loss, congestion, tinnitus and ear discharge.  dentist--last spring Eyes: Negative for visual disturbance (see optho no--due) Respiratory: Negative for cough, chest tightness and shortness of breath.   Cardiovascular: Negative for chest pain, palpitations and leg swelling.  Gastrointestinal: Negative for abdominal pain, diarrhea, constipation and abdominal distention.  Genitourinary: Negative for urgency, frequency, decreased urine volume and difficulty urinating.  Musculoskeletal: Negative for back pain, arthralgias and gait problem.  Skin: Negative for color change, pallor and rash.  Neurological: Negative for dizziness, light-headedness, numbness and headaches.  Hematological: Negative for adenopathy. Does not bruise/bleed easily.  Psychiatric/Behavioral: Negative for suicidal ideas, confusion, sleep disturbance, self-injury, dysphoric mood, decreased concentration and agitation.       Objective:    BP 134/76  Pulse 85  Temp 98.5 F (36.9 C) (Oral)  Ht 5\' 4"  (1.626 m)  Wt 187 lb 12.8 oz (85.186 kg)  BMI 32.24 kg/m2  SpO2 97% General appearance: alert, cooperative, appears stated age and no distress Head: Normocephalic, without obvious abnormality, atraumatic Eyes: conjunctivae/corneas clear. PERRL, EOM's intact. Fundi benign. Ears: normal TM's and external ear canals both ears Nose: Nares normal. Septum midline. Mucosa normal. No drainage or sinus tenderness. Throat: lips, mucosa, and tongue normal; teeth and gums normal Neck: no adenopathy, no carotid bruit, no JVD, supple,  symmetrical, trachea midline and thyroid not enlarged, symmetric, no tenderness/mass/nodules Back: symmetric, no curvature. ROM normal. No CVA tenderness. Lungs: clear to auscultation bilaterally Breasts: normal appearance, no masses or tenderness Heart: S1, S2 normal Abdomen: soft, non-tender; bowel sounds normal; no masses,  no organomegaly Pelvic: deferred Extremities: extremities normal, atraumatic, no cyanosis or edema Pulses: 2+ and symmetric Skin: Skin color, texture, turgor normal. No rashes or lesions Lymph nodes: Cervical, supraclavicular, and axillary nodes normal. Neurologic: Grossly normal psych-- no depression, no anxiety    Assessment:    Healthy female exam.      Plan:    ghm utd except for below Check labs Pt refused colonoscopy--- says she does not have time to have one See After Visit Summary for Counseling Recommendations

## 2012-04-03 LAB — CBC WITH DIFFERENTIAL/PLATELET
HCT: 39.9 % (ref 36.0–46.0)
Hemoglobin: 13.4 g/dL (ref 12.0–15.0)
Lymphs Abs: 1.4 10*3/uL (ref 0.7–4.0)
MCHC: 33.7 g/dL (ref 30.0–36.0)
Monocytes Absolute: 0.4 10*3/uL (ref 0.1–1.0)
Monocytes Relative: 6.9 % (ref 3.0–12.0)
Neutro Abs: 3.9 10*3/uL (ref 1.4–7.7)
Platelets: 177 10*3/uL (ref 150.0–400.0)

## 2013-07-19 ENCOUNTER — Other Ambulatory Visit: Payer: Self-pay | Admitting: General Practice

## 2013-07-19 MED ORDER — HYDROCHLOROTHIAZIDE 25 MG PO TABS: 25.0000 mg | ORAL_TABLET | Freq: Every day | ORAL | Status: DC

## 2013-07-19 NOTE — Telephone Encounter (Signed)
Med filled.  

## 2013-10-17 ENCOUNTER — Encounter: Payer: Self-pay | Admitting: Family Medicine

## 2013-10-17 ENCOUNTER — Telehealth: Payer: Self-pay

## 2013-10-17 ENCOUNTER — Encounter: Payer: Self-pay | Admitting: Gastroenterology

## 2013-10-17 ENCOUNTER — Telehealth: Payer: Self-pay | Admitting: Family Medicine

## 2013-10-17 ENCOUNTER — Other Ambulatory Visit (HOSPITAL_COMMUNITY)
Admission: RE | Admit: 2013-10-17 | Discharge: 2013-10-17 | Disposition: A | Discharge status: Home / Self Care | Payer: Medicare HMO | Source: Ambulatory Visit | Attending: Family Medicine | Admitting: Family Medicine

## 2013-10-17 ENCOUNTER — Ambulatory Visit (INDEPENDENT_AMBULATORY_CARE_PROVIDER_SITE_OTHER): Payer: Commercial Managed Care - HMO | Admitting: Family Medicine

## 2013-10-17 VITALS — BP 132/74 | HR 76 | Temp 98.0°F | Ht 64.0 in | Wt 192.0 lb

## 2013-10-17 DIAGNOSIS — I10 Essential (primary) hypertension: Secondary | ICD-10-CM

## 2013-10-17 DIAGNOSIS — Z23 Encounter for immunization: Secondary | ICD-10-CM

## 2013-10-17 DIAGNOSIS — Z124 Encounter for screening for malignant neoplasm of cervix: Secondary | ICD-10-CM

## 2013-10-17 DIAGNOSIS — R05 Cough: Secondary | ICD-10-CM

## 2013-10-17 DIAGNOSIS — R059 Cough, unspecified: Secondary | ICD-10-CM

## 2013-10-17 DIAGNOSIS — E2839 Other primary ovarian failure: Secondary | ICD-10-CM

## 2013-10-17 DIAGNOSIS — Z0000 Encounter for general adult medical examination without abnormal findings: Secondary | ICD-10-CM

## 2013-10-17 DIAGNOSIS — H538 Other visual disturbances: Secondary | ICD-10-CM

## 2013-10-17 DIAGNOSIS — Z1231 Encounter for screening mammogram for malignant neoplasm of breast: Secondary | ICD-10-CM

## 2013-10-17 DIAGNOSIS — Z1151 Encounter for screening for human papillomavirus (HPV): Secondary | ICD-10-CM | POA: Insufficient documentation

## 2013-10-17 LAB — POCT URINALYSIS DIPSTICK
BILIRUBIN UA: NEGATIVE
Blood, UA: NEGATIVE
Glucose, UA: NEGATIVE
KETONES UA: NEGATIVE
LEUKOCYTES UA: NEGATIVE
Nitrite, UA: NEGATIVE
PH UA: 7.5
Protein, UA: NEGATIVE
Spec Grav, UA: 1.01
Urobilinogen, UA: 0.2

## 2013-10-17 LAB — LIPID PANEL
CHOLESTEROL: 163 mg/dL (ref 0–200)
HDL: 50.7 mg/dL (ref >39.00)
LDL Cholesterol: 100 mg/dL — ABNORMAL HIGH (ref 0–99)
NonHDL: 112.3
TRIGLYCERIDES: 60 mg/dL (ref 0.0–149.0)
Total CHOL/HDL Ratio: 3
VLDL: 12 mg/dL (ref 0.0–40.0)

## 2013-10-17 LAB — CBC WITH DIFFERENTIAL/PLATELET
BASOS ABS: 0 10*3/uL (ref 0.0–0.1)
Basophils Relative: 0.4 % (ref 0.0–3.0)
EOS ABS: 0.2 10*3/uL (ref 0.0–0.7)
Eosinophils Relative: 3 % (ref 0.0–5.0)
HEMATOCRIT: 39.7 % (ref 36.0–46.0)
HEMOGLOBIN: 13.3 g/dL (ref 12.0–15.0)
LYMPHS ABS: 2 10*3/uL (ref 0.7–4.0)
Lymphocytes Relative: 33.3 % (ref 12.0–46.0)
MCHC: 33.6 g/dL (ref 30.0–36.0)
MCV: 91 fl (ref 78.0–100.0)
Monocytes Absolute: 0.5 10*3/uL (ref 0.1–1.0)
Monocytes Relative: 8.8 % (ref 3.0–12.0)
Neutro Abs: 3.3 10*3/uL (ref 1.4–7.7)
Neutrophils Relative %: 54.5 % (ref 43.0–77.0)
Platelets: 191 10*3/uL (ref 150.0–400.0)
RBC: 4.36 Mil/uL (ref 3.87–5.11)
RDW: 13.1 % (ref 11.5–15.5)
WBC: 6.1 10*3/uL (ref 4.0–10.5)

## 2013-10-17 LAB — BASIC METABOLIC PANEL
BUN: 15 mg/dL (ref 6–23)
CO2: 32 meq/L (ref 19–32)
Calcium: 9 mg/dL (ref 8.4–10.5)
Chloride: 102 mEq/L (ref 96–112)
Creatinine, Ser: 0.7 mg/dL (ref 0.4–1.2)
GFR: 87.8 mL/min (ref >60.00)
GLUCOSE: 113 mg/dL — AB (ref 70–99)
POTASSIUM: 3.6 meq/L (ref 3.5–5.1)
SODIUM: 139 meq/L (ref 135–145)

## 2013-10-17 LAB — HEPATIC FUNCTION PANEL
ALBUMIN: 3.6 g/dL (ref 3.5–5.2)
ALT: 18 U/L (ref 0–35)
AST: 17 U/L (ref 0–37)
Alkaline Phosphatase: 64 U/L (ref 39–117)
Bilirubin, Direct: 0.1 mg/dL (ref 0.0–0.3)
Total Bilirubin: 0.6 mg/dL (ref 0.2–1.2)
Total Protein: 7.1 g/dL (ref 6.0–8.3)

## 2013-10-17 MED ORDER — HYDROCHLOROTHIAZIDE 25 MG PO TABS: 25.0000 mg | ORAL_TABLET | Freq: Every day | ORAL | Status: DC

## 2013-10-17 NOTE — Telephone Encounter (Signed)
Patient called to talk with [facility] about referral. Cooper Render staff message.

## 2013-10-17 NOTE — Progress Notes (Signed)
Subjective:    Mindy Anderson is a 63 y.o. female who presents for a welcome to Medicare exam.   Cardiac risk factors: advanced age (older than 49 for men, 61 for women), hypertension and sedentary lifestyle.  Activities of Daily Living  In your present state of health, do you have any difficulty performing the following activities?:  Preparing food and eating?: No Bathing yourself: No Getting dressed: No Using the toilet:No Moving around from place to place: No In the past year have you fallen or had a near fall?:No  Current exercise habits: The patient does not participate in regular exercise at present.   Dietary issues discussed: na   Depression Screen (Note: if answer to either of the following is "Yes", then a more complete depression screening is indicated)  Q1: Over the past two weeks, have you felt down, depressed or hopeless?no Q2: Over the past two weeks, have you felt little interest or pleasure in doing things? no   The following portions of the patient's history were reviewed and updated as appropriate:  She  has a past medical history of Hypertension. She  does not have any pertinent problems on file. She  has no past surgical history on file. Her family history includes Breast cancer in her mother and sister; COPD in her father; Cervical cancer in her mother; Colon cancer in her sister; Diabetes in her sister; Heart disease in her brother; Heart disease (age of onset: 47) in her father; Heart disease (age of onset: 25) in her mother; Liver cancer in her sister; Lung cancer in her sister; Uterine cancer in her mother. She  reports that she has never smoked. She has never used smokeless tobacco. She reports that she does not drink alcohol or use illicit drugs.--- her sister passed away in 2023-02-19 with lung and colon cancer She has a current medication list which includes the following prescription(s): aspirin, hydrochlorothiazide, calcium carbonate, vitamin d, fish  oil-omega-3 fatty acids, and potassium. Current Outpatient Prescriptions on File Prior to Visit  Medication Sig Dispense Refill  . aspirin 81 MG tablet Take 81 mg by mouth daily.        . hydrochlorothiazide (HYDRODIURIL) 25 MG tablet Take 1 tablet (25 mg total) by mouth daily.  90 tablet  0  . calcium carbonate (OS-CAL) 600 MG TABS Take 600 mg by mouth 2 (two) times daily with a meal.        . Cholecalciferol (VITAMIN D) 2000 UNITS tablet Take 2,000 Units by mouth daily.        . fish oil-omega-3 fatty acids 1000 MG capsule Take 1 g by mouth daily.        . Potassium 95 MG TABS Take 1 tablet by mouth daily.         No current facility-administered medications on file prior to visit.   She has No Known Allergies.. Review of Systems  Review of Systems  Constitutional: Negative for activity change, appetite change and fatigue.  HENT: Negative for hearing loss, congestion, tinnitus and ear discharge.   Eyes: Negative for visual disturbance (see optho q1y --  Respiratory: Negative for cough, chest tightness and shortness of breath.   Cardiovascular: Negative for chest pain, palpitations and leg swelling.  Gastrointestinal: Negative for abdominal pain, diarrhea, constipation and abdominal distention.  Genitourinary: Negative for urgency, frequency, decreased urine volume and difficulty urinating.  Musculoskeletal: Negative for back pain, arthralgias and gait problem.  Skin: Negative for color change, pallor and rash.  Neurological: Negative for  dizziness, light-headedness, numbness and headaches.  Hematological: Negative for adenopathy. Does not bruise/bleed easily.  Psychiatric/Behavioral: Negative for suicidal ideas, confusion, sleep disturbance, self-injury, dysphoric mood, decreased concentration and agitation.  Pt is able to read and write and can do all ADLs No risk for falling No abuse/ violence in home      Objective:     Vision by Snellen chart: opth ,Blood pressure 132/74,  pulse 76, temperature 98 F (36.7 C), temperature source Oral, height 5\' 4"  (1.626 m), weight 192 lb (87.091 kg), SpO2 97.00%. Body mass index is 32.94 kg/(m^2). BP 132/74  Pulse 76  Temp(Src) 98 F (36.7 C) (Oral)  Ht 5\' 4"  (1.626 m)  Wt 192 lb (87.091 kg)  BMI 32.94 kg/m2  SpO2 97% General appearance: alert, cooperative, appears stated age and no distress Head: Normocephalic, without obvious abnormality, atraumatic Eyes: conjunctivae/corneas clear. PERRL, EOM's intact. Fundi benign. Ears: normal TM's and external ear canals both ears Nose: Nares normal. Septum midline. Mucosa normal. No drainage or sinus tenderness. Throat: lips, mucosa, and tongue normal; teeth and gums normal Neck: no adenopathy, no carotid bruit, no JVD, supple, symmetrical, trachea midline and thyroid not enlarged, symmetric, no tenderness/mass/nodules Back: symmetric, no curvature. ROM normal. No CVA tenderness. Lungs: clear to auscultation bilaterally Breasts: normal appearance, no masses or tenderness Heart: regular rate and rhythm, S1, S2 normal, no murmur, click, rub or gallop Abdomen: soft, non-tender; bowel sounds normal; no masses,  no organomegaly Pelvic: cervix normal in appearance, external genitalia normal, no adnexal masses or tenderness, no cervical motion tenderness, rectovaginal septum normal, uterus normal size, shape, and consistency, vagina normal without discharge and pap done Extremities: extremities normal, atraumatic, no cyanosis or edema Pulses: 2+ and symmetric Skin: Skin color, texture, turgor normal. No rashes or lesions Lymph nodes: Cervical, supraclavicular, and axillary nodes normal. Neurologic: Alert and oriented X 3, normal strength and tone. Normal symmetric reflexes. Normal coordination and gait Psych--no depression/ anxiety      Assessment:     cpe      Plan:     During the course of the visit the patient was educated and counseled about appropriate screening and  preventive services including:   Screening electrocardiogram  Screening mammography  Screening Pap smear and pelvic exam   Bone densitometry screening  Colorectal cancer screening  Diabetes screening  Glaucoma screening  Advanced directives: has an advanced directive - a copy HAS NOT been provided.  Patient Instructions (the written plan) was given to the patient.   1. Welcome to Medicare preventive visit  - EKG 12-Lead - Ambulatory referral to Gastroenterology - Cytology - PAP  2. Other screening mammogram  - MM DIGITAL SCREENING BILATERAL; Future  3. Estrogen deficiency  - DG Bone Density; Future  4. Blurry vision  - Ambulatory referral to Ophthalmology  5. Cough Family hx lung cancer - DG Chest 2 View; Future  6. Essential hypertension  - Basic metabolic panel - CBC with Differential - Hepatic function panel - Lipid panel - POCT urinalysis dipstick  7. Screening for malignant neoplasm of the cervix  - Cytology - PAP  8. Need for pneumococcal vaccination  - Pneumococcal polysaccharide vaccine 23-valent greater than or equal to 2yo subcutaneous/IM

## 2013-10-17 NOTE — Telephone Encounter (Signed)
Rx faxed.    KP 

## 2013-10-17 NOTE — Telephone Encounter (Signed)
Caller name:  Orlean  Call back number: [phone] Pharmacy: [facility]  (with [employer] now)  Reason for call:  PT needs refill on Rx hydrochlorothiazide (HYDRODIURIL) 25 MG tablet

## 2013-10-17 NOTE — Patient Instructions (Signed)
Preventive Care for Adults A healthy lifestyle and preventive care can promote health and wellness. Preventive health guidelines for women include the following key practices.  A routine yearly physical is a good way to check with your health care provider about your health and preventive screening. It is a chance to share any concerns and updates on your health and to receive a thorough exam.  Visit your dentist for a routine exam and preventive care every 6 months. Brush your teeth twice a day and floss once a day. Good oral hygiene prevents tooth decay and gum disease.  The frequency of eye exams is based on your age, health, family medical history, use of contact lenses, and other factors. Follow your health care provider's recommendations for frequency of eye exams.  Eat a healthy diet. Foods like vegetables, fruits, whole grains, low-fat dairy products, and lean protein foods contain the nutrients you need without too many calories. Decrease your intake of foods high in solid fats, added sugars, and salt. Eat the right amount of calories for you.Get information about a proper diet from your health care provider, if necessary.  Regular physical exercise is one of the most important things you can do for your health. Most adults should get at least 150 minutes of moderate-intensity exercise (any activity that increases your heart rate and causes you to sweat) each week. In addition, most adults need muscle-strengthening exercises on 2 or more days a week.  Maintain a healthy weight. The body mass index (BMI) is a screening tool to identify possible weight problems. It provides an estimate of body fat based on height and weight. Your health care provider can find your BMI, and can help you achieve or maintain a healthy weight.For adults 20 years and older:  A BMI below 18.5 is considered underweight.  A BMI of 18.5 to 24.9 is normal.  A BMI of 25 to 29.9 is considered overweight.  A BMI of  30 and above is considered obese.  Maintain normal blood lipids and cholesterol levels by exercising and minimizing your intake of saturated fat. Eat a balanced diet with plenty of fruit and vegetables. Blood tests for lipids and cholesterol should begin at age 52 and be repeated every 5 years. If your lipid or cholesterol levels are high, you are over 50, or you are at high risk for heart disease, you may need your cholesterol levels checked more frequently.Ongoing high lipid and cholesterol levels should be treated with medicines if diet and exercise are not working.  If you smoke, find out from your health care provider how to quit. If you do not use tobacco, do not start.  Lung cancer screening is recommended for adults aged 37-80 years who are at high risk for developing lung cancer because of a history of smoking. A yearly low-dose CT scan of the lungs is recommended for people who have at least a 30-pack-year history of smoking and are a current smoker or have quit within the past 15 years. A pack year of smoking is smoking an average of 1 pack of cigarettes a day for 1 year (for example: 1 pack a day for 30 years or 2 packs a day for 15 years). Yearly screening should continue until the smoker has stopped smoking for at least 15 years. Yearly screening should be stopped for people who develop a health problem that would prevent them from having lung cancer treatment.  If you are pregnant, do not drink alcohol. If you are breastfeeding,  be very cautious about drinking alcohol. If you are not pregnant and choose to drink alcohol, do not have more than 1 drink per day. One drink is considered to be 12 ounces (355 mL) of beer, 5 ounces (148 mL) of wine, or 1.5 ounces (44 mL) of liquor.  Avoid use of street drugs. Do not share needles with anyone. Ask for help if you need support or instructions about stopping the use of drugs.  High blood pressure causes heart disease and increases the risk of  stroke. Your blood pressure should be checked at least every 1 to 2 years. Ongoing high blood pressure should be treated with medicines if weight loss and exercise do not work.  If you are 75-52 years old, ask your health care provider if you should take aspirin to prevent strokes.  Diabetes screening involves taking a blood sample to check your fasting blood sugar level. This should be done once every 3 years, after age 15, if you are within normal weight and without risk factors for diabetes. Testing should be considered at a younger age or be carried out more frequently if you are overweight and have at least 1 risk factor for diabetes.  Breast cancer screening is essential preventive care for women. You should practice "breast self-awareness." This means understanding the normal appearance and feel of your breasts and may include breast self-examination. Any changes detected, no matter how small, should be reported to a health care provider. Women in their 58s and 30s should have a clinical breast exam (CBE) by a health care provider as part of a regular health exam every 1 to 3 years. After age 16, women should have a CBE every year. Starting at age 53, women should consider having a mammogram (breast X-ray test) every year. Women who have a family history of breast cancer should talk to their health care provider about genetic screening. Women at a high risk of breast cancer should talk to their health care providers about having an MRI and a mammogram every year.  Breast cancer gene (BRCA)-related cancer risk assessment is recommended for women who have family members with BRCA-related cancers. BRCA-related cancers include breast, ovarian, tubal, and peritoneal cancers. Having family members with these cancers may be associated with an increased risk for harmful changes (mutations) in the breast cancer genes BRCA1 and BRCA2. Results of the assessment will determine the need for genetic counseling and  BRCA1 and BRCA2 testing.  Routine pelvic exams to screen for cancer are no longer recommended for nonpregnant women who are considered low risk for cancer of the pelvic organs (ovaries, uterus, and vagina) and who do not have symptoms. Ask your health care provider if a screening pelvic exam is right for you.  If you have had past treatment for cervical cancer or a condition that could lead to cancer, you need Pap tests and screening for cancer for at least 20 years after your treatment. If Pap tests have been discontinued, your risk factors (such as having a new sexual partner) need to be reassessed to determine if screening should be resumed. Some women have medical problems that increase the chance of getting cervical cancer. In these cases, your health care provider may recommend more frequent screening and Pap tests.  The HPV test is an additional test that may be used for cervical cancer screening. The HPV test looks for the virus that can cause the cell changes on the cervix. The cells collected during the Pap test can be  tested for HPV. The HPV test could be used to screen women aged 47 years and older, and should be used in women of any age who have unclear Pap test results. After the age of 36, women should have HPV testing at the same frequency as a Pap test.  Colorectal cancer can be detected and often prevented. Most routine colorectal cancer screening begins at the age of 38 years and continues through age 58 years. However, your health care provider may recommend screening at an earlier age if you have risk factors for colon cancer. On a yearly basis, your health care provider may provide home test kits to check for hidden blood in the stool. Use of a small camera at the end of a tube, to directly examine the colon (sigmoidoscopy or colonoscopy), can detect the earliest forms of colorectal cancer. Talk to your health care provider about this at age 64, when routine screening begins. Direct  exam of the colon should be repeated every 5-10 years through age 21 years, unless early forms of pre-cancerous polyps or small growths are found.  People who are at an increased risk for hepatitis B should be screened for this virus. You are considered at high risk for hepatitis B if:  You were born in a country where hepatitis B occurs often. Talk with your health care provider about which countries are considered high risk.  Your parents were born in a high-risk country and you have not received a shot to protect against hepatitis B (hepatitis B vaccine).  You have HIV or AIDS.  You use needles to inject street drugs.  You live with, or have sex with, someone who has Hepatitis B.  You get hemodialysis treatment.  You take certain medicines for conditions like cancer, organ transplantation, and autoimmune conditions.  Hepatitis C blood testing is recommended for all people born from [birth year] through 1965 and any individual with known risks for hepatitis C.  Practice safe sex. Use condoms and avoid high-risk sexual practices to reduce the spread of sexually transmitted infections (STIs). STIs include gonorrhea, chlamydia, syphilis, trichomonas, herpes, HPV, and human immunodeficiency virus (HIV). Herpes, HIV, and HPV are viral illnesses that have no cure. They can result in disability, cancer, and death.  You should be screened for sexually transmitted illnesses (STIs) including gonorrhea and chlamydia if:  You are sexually active and are younger than 24 years.  You are older than 24 years and your health care provider tells you that you are at risk for this type of infection.  Your sexual activity has changed since you were last screened and you are at an increased risk for chlamydia or gonorrhea. Ask your health care provider if you are at risk.  If you are at risk of being infected with HIV, it is recommended that you take a prescription medicine daily to prevent HIV infection. This is  called preexposure prophylaxis (PrEP). You are considered at risk if:  You are a heterosexual woman, are sexually active, and are at increased risk for HIV infection.  You take drugs by injection.  You are sexually active with a partner who has HIV.  Talk with your health care provider about whether you are at high risk of being infected with HIV. If you choose to begin PrEP, you should first be tested for HIV. You should then be tested every 3 months for as long as you are taking PrEP.  Osteoporosis is a disease in which the bones lose minerals and strength  with aging. This can result in serious bone fractures or breaks. The risk of osteoporosis can be identified using a bone density scan. Women ages 65 years and over and women at risk for fractures or osteoporosis should discuss screening with their health care providers. Ask your health care provider whether you should take a calcium supplement or vitamin D to reduce the rate of osteoporosis.  Menopause can be associated with physical symptoms and risks. Hormone replacement therapy is available to decrease symptoms and risks. You should talk to your health care provider about whether hormone replacement therapy is right for you.  Use sunscreen. Apply sunscreen liberally and repeatedly throughout the day. You should seek shade when your shadow is shorter than you. Protect yourself by wearing long sleeves, pants, a wide-brimmed hat, and sunglasses year round, whenever you are outdoors.  Once a month, do a whole body skin exam, using a mirror to look at the skin on your back. Tell your health care provider of new moles, moles that have irregular borders, moles that are larger than a pencil eraser, or moles that have changed in shape or color.  Stay current with required vaccines (immunizations).  Influenza vaccine. All adults should be immunized every year.  Tetanus, diphtheria, and acellular pertussis (Td, Tdap) vaccine. Pregnant women should  receive 1 dose of Tdap vaccine during each pregnancy. The dose should be obtained regardless of the length of time since the last dose. Immunization is preferred during the 27th-36th week of gestation. An adult who has not previously received Tdap or who does not know her vaccine status should receive 1 dose of Tdap. This initial dose should be followed by tetanus and diphtheria toxoids (Td) booster doses every 10 years. Adults with an unknown or incomplete history of completing a 3-dose immunization series with Td-containing vaccines should begin or complete a primary immunization series including a Tdap dose. Adults should receive a Td booster every 10 years.  Varicella vaccine. An adult without evidence of immunity to varicella should receive 2 doses or a second dose if she has previously received 1 dose. Pregnant females who do not have evidence of immunity should receive the first dose after pregnancy. This first dose should be obtained before leaving the health care facility. The second dose should be obtained 4-8 weeks after the first dose.  Human papillomavirus (HPV) vaccine. Females aged 13-26 years who have not received the vaccine previously should obtain the 3-dose series. The vaccine is not recommended for use in pregnant females. However, pregnancy testing is not needed before receiving a dose. If a female is found to be pregnant after receiving a dose, no treatment is needed. In that case, the remaining doses should be delayed until after the pregnancy. Immunization is recommended for any person with an immunocompromised condition through the age of 26 years if she did not get any or all doses earlier. During the 3-dose series, the second dose should be obtained 4-8 weeks after the first dose. The third dose should be obtained 24 weeks after the first dose and 16 weeks after the second dose.  Zoster vaccine. One dose is recommended for adults aged 60 years or older unless certain conditions are  present.  Measles, mumps, and rubella (MMR) vaccine. Adults born before 1957 generally are considered immune to measles and mumps. Adults born in 1957 or later should have 1 or more doses of MMR vaccine unless there is a contraindication to the vaccine or there is laboratory evidence of immunity to   each of the three diseases. A routine second dose of MMR vaccine should be obtained at least 28 days after the first dose for students attending postsecondary schools, health care workers, or international travelers. People who received inactivated measles vaccine or an unknown type of measles vaccine during 1963-1967 should receive 2 doses of MMR vaccine. People who received inactivated mumps vaccine or an unknown type of mumps vaccine before 1979 and are at high risk for mumps infection should consider immunization with 2 doses of MMR vaccine. For females of childbearing age, rubella immunity should be determined. If there is no evidence of immunity, females who are not pregnant should be vaccinated. If there is no evidence of immunity, females who are pregnant should delay immunization until after pregnancy. Unvaccinated health care workers born before 1957 who lack laboratory evidence of measles, mumps, or rubella immunity or laboratory confirmation of disease should consider measles and mumps immunization with 2 doses of MMR vaccine or rubella immunization with 1 dose of MMR vaccine.  Pneumococcal 13-valent conjugate (PCV13) vaccine. When indicated, a person who is uncertain of her immunization history and has no record of immunization should receive the PCV13 vaccine. An adult aged 19 years or older who has certain medical conditions and has not been previously immunized should receive 1 dose of PCV13 vaccine. This PCV13 should be followed with a dose of pneumococcal polysaccharide (PPSV23) vaccine. The PPSV23 vaccine dose should be obtained at least 8 weeks after the dose of PCV13 vaccine. An adult aged 19  years or older who has certain medical conditions and previously received 1 or more doses of PPSV23 vaccine should receive 1 dose of PCV13. The PCV13 vaccine dose should be obtained 1 or more years after the last PPSV23 vaccine dose.  Pneumococcal polysaccharide (PPSV23) vaccine. When PCV13 is also indicated, PCV13 should be obtained first. All adults aged 65 years and older should be immunized. An adult younger than age 65 years who has certain medical conditions should be immunized. Any person who resides in a nursing home or long-term care facility should be immunized. An adult smoker should be immunized. People with an immunocompromised condition and certain other conditions should receive both PCV13 and PPSV23 vaccines. People with human immunodeficiency virus (HIV) infection should be immunized as soon as possible after diagnosis. Immunization during chemotherapy or radiation therapy should be avoided. Routine use of PPSV23 vaccine is not recommended for American Indians, [place] Natives, or people younger than 65 years unless there are medical conditions that require PPSV23 vaccine. When indicated, people who have unknown immunization and have no record of immunization should receive PPSV23 vaccine. One-time revaccination 5 years after the first dose of PPSV23 is recommended for people aged 19-64 years who have chronic kidney failure, nephrotic syndrome, asplenia, or immunocompromised conditions. People who received 1-2 doses of PPSV23 before age 65 years should receive another dose of PPSV23 vaccine at age 65 years or later if at least 5 years have passed since the previous dose. Doses of PPSV23 are not needed for people immunized with PPSV23 at or after age 65 years.  Meningococcal vaccine. Adults with asplenia or persistent complement component deficiencies should receive 2 doses of quadrivalent meningococcal conjugate (MenACWY-D) vaccine. The doses should be obtained at least 2 months apart.  Microbiologists working with certain meningococcal bacteria, military recruits, people at risk during an outbreak, and people who travel to or live in countries with a high rate of meningitis should be immunized. A first-year college student up through age   21 years who is living in a residence hall should receive a dose if she did not receive a dose on or after her 16th birthday. Adults who have certain high-risk conditions should receive one or more doses of vaccine.  Hepatitis A vaccine. Adults who wish to be protected from this disease, have certain high-risk conditions, work with hepatitis A-infected animals, work in hepatitis A research labs, or travel to or work in countries with a high rate of hepatitis A should be immunized. Adults who were previously unvaccinated and who anticipate close contact with an international adoptee during the first 60 days after arrival in the [place] from a country with a high rate of hepatitis A should be immunized.  Hepatitis B vaccine. Adults who wish to be protected from this disease, have certain high-risk conditions, may be exposed to blood or other infectious body fluids, are household contacts or sex partners of hepatitis B positive people, are clients or workers in certain care facilities, or travel to or work in countries with a high rate of hepatitis B should be immunized.  Haemophilus influenzae type b (Hib) vaccine. A previously unvaccinated person with asplenia or sickle cell disease or having a scheduled splenectomy should receive 1 dose of Hib vaccine. Regardless of previous immunization, a recipient of a hematopoietic stem cell transplant should receive a 3-dose series 6-12 months after her successful transplant. Hib vaccine is not recommended for adults with HIV infection. Preventive Services / Frequency Ages 43 to 39years  Blood pressure check.** / Every 1 to 2 years.  Lipid and cholesterol check.** / Every 5 years beginning at age  75.  Clinical breast exam.** / Every 3 years for women in their 32s and 74s.  BRCA-related cancer risk assessment.** / For women who have family members with a BRCA-related cancer (breast, ovarian, tubal, or peritoneal cancers).  Pap test.** / Every 2 years from ages 65 through 91. Every 3 years starting at age 34 through age 93 or 72 with a history of 3 consecutive normal Pap tests.  HPV screening.** / Every 3 years from ages 46 through ages 53 to 26 with a history of 3 consecutive normal Pap tests.  Hepatitis C blood test.** / For any individual with known risks for hepatitis C.  Skin self-exam. / Monthly.  Influenza vaccine. / Every year.  Tetanus, diphtheria, and acellular pertussis (Tdap, Td) vaccine.** / Consult your health care provider. Pregnant women should receive 1 dose of Tdap vaccine during each pregnancy. 1 dose of Td every 10 years.  Varicella vaccine.** / Consult your health care provider. Pregnant females who do not have evidence of immunity should receive the first dose after pregnancy.  HPV vaccine. / 3 doses over 6 months, if 70 and younger. The vaccine is not recommended for use in pregnant females. However, pregnancy testing is not needed before receiving a dose.  Measles, mumps, rubella (MMR) vaccine.** / You need at least 1 dose of MMR if you were born in 1957 or later. You may also need a 2nd dose. For females of childbearing age, rubella immunity should be determined. If there is no evidence of immunity, females who are not pregnant should be vaccinated. If there is no evidence of immunity, females who are pregnant should delay immunization until after pregnancy.  Pneumococcal 13-valent conjugate (PCV13) vaccine.** / Consult your health care provider.  Pneumococcal polysaccharide (PPSV23) vaccine.** / 1 to 2 doses if you smoke cigarettes or if you have certain conditions.  Meningococcal vaccine.** /  1 dose if you are age 70 to 51 years and a [place] living in a residence hall, or have one of several medical conditions, you need to get vaccinated against meningococcal disease. You may also need additional booster doses.  Hepatitis A vaccine.** / Consult your health care provider.  Hepatitis B vaccine.** / Consult your health care provider.  Haemophilus influenzae type b (Hib) vaccine.** / Consult your health care provider. Ages 40 to 64years  Blood pressure check.** / Every 1 to 2 years.  Lipid and cholesterol check.** / Every 5 years beginning at age 58 years.  Lung cancer screening. / Every year if you are aged 56-80 years and have a 30-pack-year history of smoking and currently smoke or have quit within the past 15 years. Yearly screening is stopped once you have quit smoking for at least 15 years or develop a health problem that would prevent you from having lung cancer treatment.  Clinical breast exam.** / Every year after age 35 years.  BRCA-related cancer risk assessment.** / For women who have family members with a BRCA-related cancer (breast, ovarian, tubal, or peritoneal cancers).  Mammogram.** / Every year beginning at age 109 years and continuing for as long as you are in good health. Consult with your health care provider.  Pap test.** / Every 3 years starting at age 44 years through age 94 or 70 years with a history of 3 consecutive normal Pap tests.  HPV screening.** / Every 3 years from ages 109 years through ages 50 to 30 years with a history of 3 consecutive normal Pap tests.  Fecal occult blood test (FOBT) of stool. / Every year beginning at age 73 years and continuing until age 59 years. You may not need to do this test if you get a colonoscopy every 10 years.  Flexible sigmoidoscopy or colonoscopy.** / Every 5 years for a flexible sigmoidoscopy or every 10 years for a colonoscopy beginning at age 68 years and continuing until age 12 years.  Hepatitis C blood test.** / For all people born from [birth year] through  1965 and any individual with known risks for hepatitis C.  Skin self-exam. / Monthly.  Influenza vaccine. / Every year.  Tetanus, diphtheria, and acellular pertussis (Tdap/Td) vaccine.** / Consult your health care provider. Pregnant women should receive 1 dose of Tdap vaccine during each pregnancy. 1 dose of Td every 10 years.  Varicella vaccine.** / Consult your health care provider. Pregnant females who do not have evidence of immunity should receive the first dose after pregnancy.  Zoster vaccine.** / 1 dose for adults aged 2 years or older.  Measles, mumps, rubella (MMR) vaccine.** / You need at least 1 dose of MMR if you were born in 1957 or later. You may also need a 2nd dose. For females of childbearing age, rubella immunity should be determined. If there is no evidence of immunity, females who are not pregnant should be vaccinated. If there is no evidence of immunity, females who are pregnant should delay immunization until after pregnancy.  Pneumococcal 13-valent conjugate (PCV13) vaccine.** / Consult your health care provider.  Pneumococcal polysaccharide (PPSV23) vaccine.** / 1 to 2 doses if you smoke cigarettes or if you have certain conditions.  Meningococcal vaccine.** / Consult your health care provider.  Hepatitis A vaccine.** / Consult your health care provider.  Hepatitis B vaccine.** / Consult your health care provider.  Haemophilus influenzae type b (Hib) vaccine.** / Consult your health care provider. Ages 48 years  and over  Blood pressure check.** / Every 1 to 2 years.  Lipid and cholesterol check.** / Every 5 years beginning at age 84 years.  Lung cancer screening. / Every year if you are aged 50-80 years and have a 30-pack-year history of smoking and currently smoke or have quit within the past 15 years. Yearly screening is stopped once you have quit smoking for at least 15 years or develop a health problem that would prevent you from having lung cancer  treatment.  Clinical breast exam.** / Every year after age 24 years.  BRCA-related cancer risk assessment.** / For women who have family members with a BRCA-related cancer (breast, ovarian, tubal, or peritoneal cancers).  Mammogram.** / Every year beginning at age 14 years and continuing for as long as you are in good health. Consult with your health care provider.  Pap test.** / Every 3 years starting at age 17 years through age 31 or 74 years with 3 consecutive normal Pap tests. Testing can be stopped between 65 and 70 years with 3 consecutive normal Pap tests and no abnormal Pap or HPV tests in the past 10 years.  HPV screening.** / Every 3 years from ages 30 years through ages 70 or 28 years with a history of 3 consecutive normal Pap tests. Testing can be stopped between 65 and 70 years with 3 consecutive normal Pap tests and no abnormal Pap or HPV tests in the past 10 years.  Fecal occult blood test (FOBT) of stool. / Every year beginning at age 64 years and continuing until age 92 years. You may not need to do this test if you get a colonoscopy every 10 years.  Flexible sigmoidoscopy or colonoscopy.** / Every 5 years for a flexible sigmoidoscopy or every 10 years for a colonoscopy beginning at age 73 years and continuing until age 39 years.  Hepatitis C blood test.** / For all people born from [birth year] through 1967 and any individual with known risks for hepatitis C.  Osteoporosis screening.** / A one-time screening for women ages 35 years and over and women at risk for fractures or osteoporosis.  Skin self-exam. / Monthly.  Influenza vaccine. / Every year.  Tetanus, diphtheria, and acellular pertussis (Tdap/Td) vaccine.** / 1 dose of Td every 10 years.  Varicella vaccine.** / Consult your health care provider.  Zoster vaccine.** / 1 dose for adults aged 59 years or older.  Pneumococcal 13-valent conjugate (PCV13) vaccine.** / Consult your health care provider.  Pneumococcal  polysaccharide (PPSV23) vaccine.** / 1 dose for all adults aged 8 years and older.  Meningococcal vaccine.** / Consult your health care provider.  Hepatitis A vaccine.** / Consult your health care provider.  Hepatitis B vaccine.** / Consult your health care provider.  Haemophilus influenzae type b (Hib) vaccine.** / Consult your health care provider. ** Family history and personal history of risk and conditions may change your health care provider's recommendations. Document Released: 05/17/2001 Document Revised: 03/26/2013 Document Reviewed: 08/16/2010 [facility] Patient Information 2015 [place], [place]. This information is not intended to replace advice given to you by your health care provider. Make sure you discuss any questions you have with your health care provider.

## 2013-10-17 NOTE — Progress Notes (Signed)
Pre visit review using our clinic review tool, if applicable. No additional management support is needed unless otherwise documented below in the visit note. 

## 2013-10-18 ENCOUNTER — Telehealth: Payer: Self-pay | Admitting: Family Medicine

## 2013-10-18 LAB — CYTOLOGY - PAP

## 2013-10-19 NOTE — Telephone Encounter (Signed)
Relevant patient education mailed to patient.  

## 2013-10-21 ENCOUNTER — Other Ambulatory Visit: Payer: Self-pay | Admitting: Family Medicine

## 2013-10-21 ENCOUNTER — Encounter: Payer: Self-pay | Admitting: General Practice

## 2013-10-21 DIAGNOSIS — R7309 Other abnormal glucose: Secondary | ICD-10-CM

## 2013-10-24 ENCOUNTER — Ambulatory Visit (INDEPENDENT_AMBULATORY_CARE_PROVIDER_SITE_OTHER)
Admission: RE | Admit: 2013-10-24 | Discharge: 2013-10-24 | Disposition: A | Discharge status: Home / Self Care | Payer: Commercial Managed Care - HMO | Source: Ambulatory Visit | Attending: Family Medicine | Admitting: Family Medicine

## 2013-10-24 DIAGNOSIS — R059 Cough, unspecified: Secondary | ICD-10-CM

## 2013-10-24 DIAGNOSIS — R05 Cough: Secondary | ICD-10-CM

## 2013-10-29 ENCOUNTER — Ambulatory Visit
Admission: RE | Admit: 2013-10-29 | Discharge: 2013-10-29 | Disposition: A | Discharge status: Home / Self Care | Payer: Commercial Managed Care - HMO | Source: Ambulatory Visit | Attending: Family Medicine | Admitting: Family Medicine

## 2013-10-29 DIAGNOSIS — Z1231 Encounter for screening mammogram for malignant neoplasm of breast: Secondary | ICD-10-CM

## 2013-10-29 DIAGNOSIS — E2839 Other primary ovarian failure: Secondary | ICD-10-CM

## 2013-12-12 ENCOUNTER — Ambulatory Visit (AMBULATORY_SURGERY_CENTER): Payer: Self-pay | Admitting: *Deleted

## 2013-12-12 ENCOUNTER — Telehealth: Payer: Self-pay | Admitting: Gastroenterology

## 2013-12-12 VITALS — Ht 65.0 in | Wt 195.6 lb

## 2013-12-12 DIAGNOSIS — Z1211 Encounter for screening for malignant neoplasm of colon: Secondary | ICD-10-CM

## 2013-12-12 MED ORDER — MOVIPREP 100 G PO SOLR: 1.0000 | Freq: Once | ORAL | Status: DC

## 2013-12-12 NOTE — Telephone Encounter (Signed)
Called voucher number for free prep into [facility].  Pt aware

## 2013-12-12 NOTE — Progress Notes (Signed)
No home 02 use. ewm No egg or soy allergy. ewm Pt has had nausea with sedation in the past. ewm emmi video to pt. ewm No previous colonoscopy per pt. ewm

## 2013-12-26 ENCOUNTER — Encounter: Payer: Self-pay | Admitting: Gastroenterology

## 2013-12-26 ENCOUNTER — Ambulatory Visit (AMBULATORY_SURGERY_CENTER): Payer: Commercial Managed Care - HMO | Admitting: Gastroenterology

## 2013-12-26 VITALS — BP 141/91 | HR 75 | Temp 96.9°F | Resp 27 | Ht 65.0 in | Wt 195.0 lb

## 2013-12-26 DIAGNOSIS — Z1211 Encounter for screening for malignant neoplasm of colon: Secondary | ICD-10-CM

## 2013-12-26 MED ORDER — SODIUM CHLORIDE 0.9 % IV SOLN: 500.0000 mL | INTRAVENOUS | Status: DC

## 2013-12-26 NOTE — Progress Notes (Signed)
Report to PACU, RN, vss, BBS= Clear.  

## 2013-12-26 NOTE — Patient Instructions (Signed)
YOU HAD AN ENDOSCOPIC PROCEDURE TODAY AT THE [facility]: Refer to the procedure report that was given to you for any specific questions about what was found during the examination.  If the procedure report does not answer your questions, please call your gastroenterologist to clarify.  If you requested that your care partner not be given the details of your procedure findings, then the procedure report has been included in a sealed envelope for you to review at your convenience later.  YOU SHOULD EXPECT: Some feelings of bloating in the abdomen. Passage of more gas than usual.  Walking can help get rid of the air that was put into your GI tract during the procedure and reduce the bloating. If you had a lower endoscopy (such as a colonoscopy or flexible sigmoidoscopy) you may notice spotting of blood in your stool or on the toilet paper. If you underwent a bowel prep for your procedure, then you may not have a normal bowel movement for a few days.  DIET: Your first meal following the procedure should be a light meal and then it is ok to progress to your normal diet.  A half-sandwich or bowl of soup is an example of a good first meal.  Heavy or fried foods are harder to digest and may make you feel nauseous or bloated.  Likewise meals heavy in dairy and vegetables can cause extra gas to form and this can also increase the bloating.  Drink plenty of fluids but you should avoid alcoholic beverages for 24 hours.  ACTIVITY: Your care partner should take you home directly after the procedure.  You should plan to take it easy, moving slowly for the rest of the day.  You can resume normal activity the day after the procedure however you should NOT DRIVE or use heavy machinery for 24 hours (because of the sedation medicines used during the test).    SYMPTOMS TO REPORT IMMEDIATELY: A gastroenterologist can be reached at any hour.  During normal business hours, 8:30 AM to 5:00 PM Monday through Friday,  call [phone].  After hours and on weekends, please call the GI answering service at [phone] who will take a message and have the physician on call contact you.   Following lower endoscopy (colonoscopy or flexible sigmoidoscopy):  Excessive amounts of blood in the stool  Significant tenderness or worsening of abdominal pains  Swelling of the abdomen that is new, acute  Fever of 100F or higher    FOLLOW UP: If any biopsies were taken you will be contacted by phone or by letter within the next 1-3 weeks.  Call your gastroenterologist if you have not heard about the biopsies in 3 weeks.  Our staff will call the home number listed on your records the next business day following your procedure to check on you and address any questions or concerns that you may have at that time regarding the information given to you following your procedure. This is a courtesy call and so if there is no answer at the home number and we have not heard from you through the emergency physician on call, we will assume that you have returned to your regular daily activities without incident.  SIGNATURES/CONFIDENTIALITY: You and/or your care partner have signed paperwork which will be entered into your electronic medical record.  These signatures attest to the fact that that the information above on your After Visit Summary has been reviewed and is understood.  Full responsibility of the confidentiality   of this discharge information lies with you and/or your care-partner.  Diverticulosis and high fiber diet information given.

## 2013-12-26 NOTE — Op Note (Signed)
[place]  [address]. [place], [id number]   COLONOSCOPY PROCEDURE REPORT  PATIENT: Mindy Anderson, Mindy Anderson  MR#: [id number] BIRTHDATE: [birth year] , 46  yrs. old GENDER: female ENDOSCOPIST: Ladene Artist, MD, [facility] REFERRED SK:AJGOT, Kendrick Fries PROCEDURE DATE:  12/26/2013 PROCEDURE:   Colonoscopy, screening First Screening Colonoscopy - Avg.  risk and is 50 yrs.  old or older - No.  Prior Negative Screening - Now for repeat screening. 10 or more years since last screening  History of Adenoma - Now for follow-up colonoscopy & has been > or = to 3 yrs.  N/A  Polyps Removed Today? No.  Polyps Removed Today? No.  Recommend repeat exam, <10 yrs? Polyps Removed Today? No.  Recommend repeat exam, <10 yrs? No. ASA CLASS:   Class II INDICATIONS:average risk for colorectal cancer. MEDICATIONS: Monitored anesthesia care and Propofol 200 mg IV DESCRIPTION OF PROCEDURE:   After the risks benefits and alternatives of the procedure were thoroughly explained, informed consent was obtained.  The digital rectal exam revealed no abnormalities of the rectum.   The LB [id number]  endoscope was introduced through the anus and advanced to the cecum, which was identified by both the appendix and ileocecal valve. No adverse events experienced.   The quality of the prep was good, using MoviPrep  The instrument was then slowly withdrawn as the colon was fully examined.  COLON FINDINGS: There was mild diverticulosis noted in the sigmoid colon.   The colon mucosa was otherwise normal.  Retroflexed views revealed no abnormalities. The time to cecum=2 minutes 29 seconds. Withdrawal time=7 minutes 35 seconds.  The scope was withdrawn and the procedure completed.  COMPLICATIONS: There were no complications.  ENDOSCOPIC IMPRESSION: 1.   Mild diverticulosis in the sigmoid colon 2.   The colon mucosa was otherwise normal  RECOMMENDATIONS: 1.  High fiber diet with liberal fluid intake. 2.   Continue to follow colorectal cancer screening guidelines for "routine risk" patients with a repeat colonoscopy in 10 years. There is no need for routine, screening FOBT (stool) testing for at least 5 years.  eSigned:  Ladene Artist, MD, [facility] 12/26/2013 10:51 AM

## 2013-12-27 ENCOUNTER — Telehealth: Payer: Self-pay | Admitting: *Deleted

## 2013-12-27 NOTE — Telephone Encounter (Signed)
  Follow up Call-  Call back number 12/26/2013  Post procedure Call Back phone  # [phone]  Permission to leave phone message Yes     Patient questions:  Do you have a fever, pain , or abdominal swelling? No. Pain Score  0 *  Have you tolerated food without any problems? Yes.    Have you been able to return to your normal activities? Yes.    Do you have any questions about your discharge instructions: Diet   No. Medications  No. Follow up visit  No.  Do you have questions or concerns about your Care? No.  Actions: * If pain score is 4 or above: No action needed, pain <4.

## 2014-01-15 ENCOUNTER — Ambulatory Visit (INDEPENDENT_AMBULATORY_CARE_PROVIDER_SITE_OTHER): Payer: Commercial Managed Care - HMO | Admitting: *Deleted

## 2014-01-15 DIAGNOSIS — Z23 Encounter for immunization: Secondary | ICD-10-CM

## 2014-03-04 ENCOUNTER — Other Ambulatory Visit: Payer: Self-pay

## 2014-03-04 MED ORDER — HYDROCHLOROTHIAZIDE 25 MG PO TABS: 25.0000 mg | ORAL_TABLET | Freq: Every day | ORAL | Status: DC

## 2014-05-27 ENCOUNTER — Emergency Department (HOSPITAL_COMMUNITY): Payer: Commercial Managed Care - HMO

## 2014-05-27 ENCOUNTER — Telehealth: Payer: Self-pay | Admitting: Family Medicine

## 2014-05-27 ENCOUNTER — Emergency Department (HOSPITAL_COMMUNITY)
Admission: EM | Admit: 2014-05-27 | Discharge: 2014-05-27 | Disposition: A | Discharge status: Home / Self Care | Payer: Commercial Managed Care - HMO | Attending: Emergency Medicine | Admitting: Emergency Medicine

## 2014-05-27 ENCOUNTER — Encounter (HOSPITAL_COMMUNITY): Payer: Self-pay | Admitting: Physical Medicine and Rehabilitation

## 2014-05-27 DIAGNOSIS — N858 Other specified noninflammatory disorders of uterus: Secondary | ICD-10-CM | POA: Diagnosis not present

## 2014-05-27 DIAGNOSIS — K838 Other specified diseases of biliary tract: Secondary | ICD-10-CM | POA: Diagnosis not present

## 2014-05-27 DIAGNOSIS — K828 Other specified diseases of gallbladder: Secondary | ICD-10-CM

## 2014-05-27 DIAGNOSIS — K829 Disease of gallbladder, unspecified: Secondary | ICD-10-CM | POA: Diagnosis not present

## 2014-05-27 DIAGNOSIS — R52 Pain, unspecified: Secondary | ICD-10-CM | POA: Diagnosis not present

## 2014-05-27 DIAGNOSIS — R079 Chest pain, unspecified: Secondary | ICD-10-CM | POA: Insufficient documentation

## 2014-05-27 DIAGNOSIS — J984 Other disorders of lung: Secondary | ICD-10-CM | POA: Diagnosis not present

## 2014-05-27 DIAGNOSIS — I10 Essential (primary) hypertension: Secondary | ICD-10-CM | POA: Diagnosis not present

## 2014-05-27 DIAGNOSIS — Z791 Long term (current) use of non-steroidal anti-inflammatories (NSAID): Secondary | ICD-10-CM | POA: Insufficient documentation

## 2014-05-27 DIAGNOSIS — Z79899 Other long term (current) drug therapy: Secondary | ICD-10-CM | POA: Diagnosis not present

## 2014-05-27 DIAGNOSIS — R1011 Right upper quadrant pain: Secondary | ICD-10-CM | POA: Diagnosis not present

## 2014-05-27 DIAGNOSIS — Z7982 Long term (current) use of aspirin: Secondary | ICD-10-CM | POA: Insufficient documentation

## 2014-05-27 DIAGNOSIS — N644 Mastodynia: Secondary | ICD-10-CM | POA: Diagnosis not present

## 2014-05-27 DIAGNOSIS — M549 Dorsalgia, unspecified: Secondary | ICD-10-CM | POA: Diagnosis not present

## 2014-05-27 DIAGNOSIS — K8020 Calculus of gallbladder without cholecystitis without obstruction: Secondary | ICD-10-CM | POA: Diagnosis not present

## 2014-05-27 DIAGNOSIS — R030 Elevated blood-pressure reading, without diagnosis of hypertension: Secondary | ICD-10-CM | POA: Diagnosis not present

## 2014-05-27 DIAGNOSIS — I700 Atherosclerosis of aorta: Secondary | ICD-10-CM | POA: Diagnosis not present

## 2014-05-27 LAB — I-STAT TROPONIN, ED: Troponin i, poc: 0.01 ng/mL (ref 0.00–0.08)

## 2014-05-27 LAB — CBC WITH DIFFERENTIAL/PLATELET
BASOS PCT: 0 % (ref 0–1)
Basophils Absolute: 0 10*3/uL (ref 0.0–0.1)
EOS ABS: 0.1 10*3/uL (ref 0.0–0.7)
EOS PCT: 2 % (ref 0–5)
HEMATOCRIT: 39.6 % (ref 36.0–46.0)
Hemoglobin: 13.1 g/dL (ref 12.0–15.0)
Lymphocytes Relative: 18 % (ref 12–46)
Lymphs Abs: 1.2 10*3/uL (ref 0.7–4.0)
MCH: 30 pg (ref 26.0–34.0)
MCHC: 33.1 g/dL (ref 30.0–36.0)
MCV: 90.6 fL (ref 78.0–100.0)
MONO ABS: 0.5 10*3/uL (ref 0.1–1.0)
Monocytes Relative: 8 % (ref 3–12)
Neutro Abs: 4.9 10*3/uL (ref 1.7–7.7)
Neutrophils Relative %: 72 % (ref 43–77)
PLATELETS: 205 10*3/uL (ref 150–400)
RBC: 4.37 MIL/uL (ref 3.87–5.11)
RDW: 12.7 % (ref 11.5–15.5)
WBC: 6.8 10*3/uL (ref 4.0–10.5)

## 2014-05-27 LAB — I-STAT CHEM 8, ED
BUN: 13 mg/dL (ref 6–23)
Calcium, Ion: 1.14 mmol/L (ref 1.13–1.30)
Chloride: 99 mmol/L (ref 96–112)
Creatinine, Ser: 0.7 mg/dL (ref 0.50–1.10)
GLUCOSE: 119 mg/dL — AB (ref 70–99)
HCT: 43 % (ref 36.0–46.0)
Hemoglobin: 14.6 g/dL (ref 12.0–15.0)
POTASSIUM: 3.6 mmol/L (ref 3.5–5.1)
SODIUM: 141 mmol/L (ref 135–145)
TCO2: 26 mmol/L (ref 0–100)

## 2014-05-27 LAB — COMPREHENSIVE METABOLIC PANEL
ALT: 21 U/L (ref 0–35)
ANION GAP: 7 (ref 5–15)
AST: 21 U/L (ref 0–37)
Albumin: 3.4 g/dL — ABNORMAL LOW (ref 3.5–5.2)
Alkaline Phosphatase: 69 U/L (ref 39–117)
BILIRUBIN TOTAL: 0.5 mg/dL (ref 0.3–1.2)
BUN: 12 mg/dL (ref 6–23)
CHLORIDE: 103 mmol/L (ref 96–112)
CO2: 30 mmol/L (ref 19–32)
CREATININE: 0.7 mg/dL (ref 0.50–1.10)
Calcium: 9.1 mg/dL (ref 8.4–10.5)
GFR calc Af Amer: >90 mL/min (ref >90)
GFR, EST NON AFRICAN AMERICAN: 89 mL/min — AB (ref >90)
GLUCOSE: 122 mg/dL — AB (ref 70–99)
POTASSIUM: 3.5 mmol/L (ref 3.5–5.1)
Sodium: 140 mmol/L (ref 135–145)
Total Protein: 6.6 g/dL (ref 6.0–8.3)

## 2014-05-27 LAB — URINALYSIS, ROUTINE W REFLEX MICROSCOPIC
Bilirubin Urine: NEGATIVE
GLUCOSE, UA: NEGATIVE mg/dL
Hgb urine dipstick: NEGATIVE
KETONES UR: NEGATIVE mg/dL
Nitrite: NEGATIVE
Protein, ur: NEGATIVE mg/dL
Specific Gravity, Urine: 1.016 (ref 1.005–1.030)
UROBILINOGEN UA: 0.2 mg/dL (ref 0.0–1.0)
pH: 7.5 (ref 5.0–8.0)

## 2014-05-27 LAB — LIPASE, BLOOD: LIPASE: 30 U/L (ref 11–59)

## 2014-05-27 LAB — URINE MICROSCOPIC-ADD ON

## 2014-05-27 MED ORDER — MORPHINE SULFATE 4 MG/ML IJ SOLN
4.0000 mg | Freq: Once | INTRAMUSCULAR | Status: AC
  Administered 2014-05-27: 4 mg via INTRAVENOUS
  Filled 2014-05-27: qty 1

## 2014-05-27 MED ORDER — HYDROCODONE-ACETAMINOPHEN 5-325 MG PO TABS: 1.0000 | ORAL_TABLET | Freq: Four times a day (QID) | ORAL | Status: DC | PRN

## 2014-05-27 MED ORDER — IOHEXOL 350 MG/ML SOLN
100.0000 mL | Freq: Once | INTRAVENOUS | Status: AC | PRN
  Administered 2014-05-27: 100 mL via INTRAVENOUS

## 2014-05-27 NOTE — Telephone Encounter (Signed)
Please advise, Nothing in ED note that discuss the breast.       KP

## 2014-05-27 NOTE — Telephone Encounter (Signed)
Referral was to see the surgeon. Ref has been placed per PCP.      KP

## 2014-05-27 NOTE — Telephone Encounter (Signed)
Pt went to the ED as advised.

## 2014-05-27 NOTE — ED Notes (Signed)
Pt presents to department for evaluation of R sided rib pain radiating to back. Ongoing since December. 8/10 pain at the time, increases with movement. Respirations unlabored. Lung sounds clear and equal bilaterally. Pt is alert and oriented x4.

## 2014-05-27 NOTE — Discharge Instructions (Signed)
Avoid fatty foods.   Take motrin for pain.   Take vicodin for severe pain.   Stay hydrated.   Follow up with surgery.   Return to ER if you have severe chest pain, back pain, vomiting, fevers.

## 2014-05-27 NOTE — Telephone Encounter (Signed)
Caller name: [place], [place] Aaminah Forrester Relation to pt: self  Call back number: [phone]   Reason for call:  Pt was seen at [facility] 05/27/14 due to chest pain and pt was referred to [facility]. Pt has [employer] pt is requesting a referral. Please advise.

## 2014-05-27 NOTE — ED Provider Notes (Addendum)
CSN: [id number]     Arrival date & time 05/27/14  7425 History   First MD Initiated Contact with Patient 05/27/14 [phone]     Chief Complaint  Patient presents with  . Chest Pain    R sided rib pain  . Back Pain     (Consider location/radiation/quality/duration/timing/severity/associated sxs/prior Treatment) The history is provided by the patient.  Mindy Anderson OVFIEPP is a 64 y.o. female hx of HTN, here with right-sided chest pain, back pain. Intermittent right-sided chest pain and back pain for last month. Since yesterday it got worse and is constant. She states that it's worse when she walks and exerts herself. Denies shortness of breath. Denies any fever or cough. Her husband wanted her to call pmd. She called PMD this morning and was sent to the ER for evaluation. Was noted to be hypertensive by EMS.    Past Medical History  Diagnosis Date  . Hypertension    Past Surgical History  Procedure Laterality Date  . Band aid surgery  1980's   Family History  Problem Relation Age of Onset  . Uterine cancer Mother   . Heart disease Mother 64    chf  . Breast cancer Mother   . Cervical cancer Mother   . Breast cancer Sister   . Diabetes Sister   . COPD Father   . Heart disease Father 6    MI  . Heart disease Brother     cabg with stents  . Colon cancer Sister   . Lung cancer Sister   . Liver cancer Sister   . Breast cancer Sister   . Liver cancer Sister   . Rectal cancer Neg Hx   . Stomach cancer Neg Hx   . Esophageal cancer Neg Hx   . Colon polyps Daughter    History  Substance Use Topics  . Smoking status: Never Smoker   . Smokeless tobacco: Never Used  . Alcohol Use: No   OB History    No data available     Review of Systems  Cardiovascular: Positive for chest pain.  Musculoskeletal: Positive for back pain.  All other systems reviewed and are negative.     Allergies  Review of patient's allergies indicates no known allergies.  Home Medications   Prior to  Admission medications   Medication Sig Start Date End Date Taking? Authorizing Provider  aspirin 81 MG tablet Take 81 mg by mouth daily.     Yes Historical Provider, MD  aspirin EC 81 MG tablet Take 324 mg by mouth once.   Yes Historical Provider, MD  calcium carbonate (OS-CAL) 600 MG TABS Take 600 mg by mouth 2 (two) times daily with a meal.     Yes Historical Provider, MD  Cholecalciferol (VITAMIN D) 2000 UNITS tablet Take 2,000 Units by mouth daily.     Yes Historical Provider, MD  fish oil-omega-3 fatty acids 1000 MG capsule Take 1 g by mouth daily.     Yes Historical Provider, MD  hydrochlorothiazide (HYDRODIURIL) 25 MG tablet Take 1 tablet (25 mg total) by mouth daily. 03/04/14 03/04/15 Yes Yvonne R Lowne, DO  ibuprofen (ADVIL,MOTRIN) 400 MG tablet Take 400 mg by mouth every 6 (six) hours as needed for mild pain.   Yes Historical Provider, MD  naproxen sodium (ANAPROX) 220 MG tablet Take 220 mg by mouth 2 (two) times daily with a meal.   Yes Historical Provider, MD  Potassium 95 MG TABS Take 1 tablet by mouth daily.  Yes Historical Provider, MD   BP 149/83 mmHg  Pulse 77  Temp(Src) 98.3 F (36.8 C) (Oral)  Resp 22  SpO2 94% Physical Exam  Constitutional: She is oriented to person, place, and time.  Uncomfortable   HENT:  Head: Normocephalic.  Mouth/Throat: Oropharynx is clear and moist.  Eyes: Conjunctivae are normal. Pupils are equal, round, and reactive to light.  Neck: Normal range of motion. Neck supple.  Cardiovascular: Normal rate, regular rhythm and normal heart sounds.   Pulmonary/Chest: Effort normal and breath sounds normal. No respiratory distress. She has no wheezes. She has no rales.  Abdominal: Soft.  + RUQ tenderness, no obvious murphy's.   Musculoskeletal: Normal range of motion. She exhibits no edema or tenderness.  Neurological: She is alert and oriented to person, place, and time. No cranial nerve deficit. Coordination normal.  Skin: Skin is warm and dry.   Psychiatric: She has a normal mood and affect. Her behavior is normal. Judgment and thought content normal.  Nursing note and vitals reviewed.   ED Course  Procedures (including critical care time) Labs Review Labs Reviewed  COMPREHENSIVE METABOLIC PANEL - Abnormal; Notable for the following:    Glucose, Bld 122 (*)    Albumin 3.4 (*)    GFR calc non Af Amer 89 (*)    All other components within normal limits  URINALYSIS, ROUTINE W REFLEX MICROSCOPIC - Abnormal; Notable for the following:    Leukocytes, UA SMALL (*)    All other components within normal limits  URINE MICROSCOPIC-ADD ON - Abnormal; Notable for the following:    Squamous Epithelial / LPF FEW (*)    All other components within normal limits  I-STAT CHEM 8, ED - Abnormal; Notable for the following:    Glucose, Bld 119 (*)    All other components within normal limits  CBC WITH DIFFERENTIAL/PLATELET  LIPASE, BLOOD  I-STAT TROPOININ, ED    Imaging Review Ct Angio Chest Pe W/cm &/or Wo Cm  05/27/2014   CLINICAL DATA:  64 year old with pain underneath the right breast. Pain radiates to the right upper back. Symptoms for 2 days.  EXAM: CT ANGIOGRAPHY CHEST, ABDOMEN AND PELVIS  TECHNIQUE: Multidetector CT imaging through the chest, abdomen and pelvis was performed using the standard protocol during bolus administration of intravenous contrast. Multiplanar reconstructed images and MIPs were obtained and reviewed to evaluate the vascular anatomy.  CONTRAST:  112mL OMNIPAQUE IOHEXOL 350 MG/ML SOLN  COMPARISON:  Chest radiograph 10/24/2013  FINDINGS: CTA CHEST FINDINGS  There is motion artifact at the aortic root. No clear evidence for a dissection or aneurysm involving the thoracic aorta. Normal configuration of the aortic arch and the great vessels are patent. The main and central pulmonary arteries are patent. No evidence for large or central pulmonary embolism. No significant chest lymphadenopathy. No significant pericardial or  pleural fluid. There is asymmetric dependent right pleural thickening on the CTA images. Suspect this is related to volume loss because it is more conspicuous than the pre contrast images.  The trachea and mainstem bronchi are patent. There are patchy densities in the lower lobes that are suggestive for atelectasis. No significant airspace disease or lung consolidation. No acute bone abnormality in the chest.  Review of the MIP images confirms the above findings.  CTA ABDOMEN AND PELVIS FINDINGS  Normal caliber of the abdominal aorta and iliac arteries. There is minimal atherosclerotic disease in the abdominal aorta. The celiac trunk and main branch vessels are patent. The superior mesenteric artery  is widely patent. Inferior mesenteric artery is patent. The main right renal artery and accessory right renal arteries are patent. Main left renal artery and the inferior accessory left renal artery are patent. Iliac and proximal femoral arteries are patent without significant atherosclerotic disease.  Normal appearance of the left kidney without hydronephrosis or suspicious suspicious lesions. There is no significant lymphadenopathy or free fluid within the abdomen or pelvis. Normal appearance of the urinary bladder.  7 mm liver hypodensity adjacent to the gallbladder could represent a cyst but nonspecific. No gross abnormality to the liver or gallbladder. Normal appearance of the spleen, pancreas and adrenal glands. Normal appearance of stomach and duodenum. There appears to be mild scarring in the right kidney interpolar region but no suspicious right renal lesion.  There is a lesion along the left posterior aspect of the uterus that measures up to 6.2 cm. Lesion appears to be displacing the endometrium towards the right. The margins of this lesion are smooth and well-defined and favor a uterine fibroid. No gross abnormality to the adnexa or ovarian tissue. Normal appearance of the appendix. No gross abnormality to  the small or large bowel. Degenerative facet arthropathy in the lower lumbar spine. No acute bone abnormality. Grade 1 anterolisthesis at L3-L4.  Review of the MIP images confirms the above findings.  IMPRESSION: No evidence for an aortic dissection or aneurysm. Limited evaluation of the aortic root as described.  Small amount of asymmetric pleural thickening along the right side as described. Suspect this finding is related to volume loss because it is less conspicuous on the pre contrast images.  Probable uterine fibroid as described.  Multilevel facet arthropathy in the lumbar spine with grade 1 anterolisthesis at L3-L4.  Accessory renal arteries.   Electronically Signed   By: Markus Daft M.D.   On: 05/27/2014 12:14   US Abdomen Limited  05/27/2014   CLINICAL DATA:  Right upper quadrant pain  EXAM: US ABDOMEN LIMITED - RIGHT UPPER QUADRANT  COMPARISON:  None.  FINDINGS: Gallbladder:  No shadowing gallstones are noted within gallbladder. Small layering gallbladder sludge. No thickening of gallbladder wall. No sonographic Murphy's sign.  Common bile duct:  Diameter: 4.3 mm in diameter within normal limits.  Liver:  No focal lesion identified. Diffuse increased echogenicity of the liver suspicious for fatty infiltration.  IMPRESSION: 1. No shadowing gallstones are noted within gallbladder. Small amount of layering gallbladder sludge. No thickening of gallbladder wall. No sonographic Murphy's sign. 2. Normal CBD. 3. Probable fatty infiltration of the liver.   Electronically Signed   By: Lahoma Crocker M.D.   On: 05/27/2014 11:08   Ct Cta Abd/pel W/cm &/or W/o Cm  05/27/2014   CLINICAL DATA:  64 year old with pain underneath the right breast. Pain radiates to the right upper back. Symptoms for 2 days.  EXAM: CT ANGIOGRAPHY CHEST, ABDOMEN AND PELVIS  TECHNIQUE: Multidetector CT imaging through the chest, abdomen and pelvis was performed using the standard protocol during bolus administration of intravenous contrast.  Multiplanar reconstructed images and MIPs were obtained and reviewed to evaluate the vascular anatomy.  CONTRAST:  133mL OMNIPAQUE IOHEXOL 350 MG/ML SOLN  COMPARISON:  Chest radiograph 10/24/2013  FINDINGS: CTA CHEST FINDINGS  There is motion artifact at the aortic root. No clear evidence for a dissection or aneurysm involving the thoracic aorta. Normal configuration of the aortic arch and the great vessels are patent. The main and central pulmonary arteries are patent. No evidence for large or central pulmonary embolism. No significant  chest lymphadenopathy. No significant pericardial or pleural fluid. There is asymmetric dependent right pleural thickening on the CTA images. Suspect this is related to volume loss because it is more conspicuous than the pre contrast images.  The trachea and mainstem bronchi are patent. There are patchy densities in the lower lobes that are suggestive for atelectasis. No significant airspace disease or lung consolidation. No acute bone abnormality in the chest.  Review of the MIP images confirms the above findings.  CTA ABDOMEN AND PELVIS FINDINGS  Normal caliber of the abdominal aorta and iliac arteries. There is minimal atherosclerotic disease in the abdominal aorta. The celiac trunk and main branch vessels are patent. The superior mesenteric artery is widely patent. Inferior mesenteric artery is patent. The main right renal artery and accessory right renal arteries are patent. Main left renal artery and the inferior accessory left renal artery are patent. Iliac and proximal femoral arteries are patent without significant atherosclerotic disease.  Normal appearance of the left kidney without hydronephrosis or suspicious suspicious lesions. There is no significant lymphadenopathy or free fluid within the abdomen or pelvis. Normal appearance of the urinary bladder.  7 mm liver hypodensity adjacent to the gallbladder could represent a cyst but nonspecific. No gross abnormality to the  liver or gallbladder. Normal appearance of the spleen, pancreas and adrenal glands. Normal appearance of stomach and duodenum. There appears to be mild scarring in the right kidney interpolar region but no suspicious right renal lesion.  There is a lesion along the left posterior aspect of the uterus that measures up to 6.2 cm. Lesion appears to be displacing the endometrium towards the right. The margins of this lesion are smooth and well-defined and favor a uterine fibroid. No gross abnormality to the adnexa or ovarian tissue. Normal appearance of the appendix. No gross abnormality to the small or large bowel. Degenerative facet arthropathy in the lower lumbar spine. No acute bone abnormality. Grade 1 anterolisthesis at L3-L4.  Review of the MIP images confirms the above findings.  IMPRESSION: No evidence for an aortic dissection or aneurysm. Limited evaluation of the aortic root as described.  Small amount of asymmetric pleural thickening along the right side as described. Suspect this finding is related to volume loss because it is less conspicuous on the pre contrast images.  Probable uterine fibroid as described.  Multilevel facet arthropathy in the lumbar spine with grade 1 anterolisthesis at L3-L4.  Accessory renal arteries.   Electronically Signed   By: Markus Daft M.D.   On: 05/27/2014 12:14     EKG Interpretation   Date/Time:  Tuesday May 27 2014 09:35:13 EST Ventricular Rate:  83 PR Interval:  143 QRS Duration: 82 QT Interval:  373 QTC Calculation: 438 R Axis:   23 Text Interpretation:  Sinus rhythm No previous ECGs available Confirmed by  Flay Ghosh  MD, [place] Taber ([id number]) on 05/27/2014 9:44:18 AM      MDM   Final diagnoses:  RUQ pain  Chest pain    Mindy Anderson is a 64 y.o. female here with RUQ pain, chest pain. Consider cholelithiasis vs ACS, less likely dissection given pain constant since yesterday. Will get dissection study, RUQ [place], trop.   12:52 PM Trop neg. RUQ US showed  sludge but no acute chole. Pain controlled. CT showed no dissection. BP improved with pain meds. I think she likely has symptomatic chole. Will dc home with pain meds. Will have her f/u with surgery and avoid fatty foods. Of note, UA contaminated and  patient asymptomatic. Will send off culture.     Wandra Arthurs, MD 05/27/14 [place] Kaion Tisdale, MD 05/27/14 [place] Reshonda Koerber, MD 05/27/14 [id number]

## 2014-05-27 NOTE — Telephone Encounter (Signed)
Er said nothing about breast center --- they mention f/u with surgery only

## 2014-05-27 NOTE — Telephone Encounter (Signed)
Patient Name: Mindy Anderson DOB: [birth year] Initial Comment Caller states she is having right side pain under her breast, and goes to her back- has been going on for a month now. Nurse Assessment Nurse: Vallery Sa, RN, Cathy Date/Time (Eastern Time): 05/27/2014 8:24:17 AM Confirm and document reason for call. If symptomatic, describe symptoms. ---Caller states she developed right chest pain about a month ago that became worse yesterday. No severe struggling to breath or blueness around her face. No injury in the past 3 days. Has the patient traveled out of the country within the last 30 days? ---No Does the patient require triage? ---Yes Related visit to physician within the last 2 weeks? ---No Does the PT have any chronic conditions? (i.e. diabetes, asthma, etc.) ---Yes List chronic conditions. ---High Blood Pressure Guidelines Guideline Title Affirmed Question Affirmed Notes Chest Pain [1] Chest pain lasts > 5 minutes AND [2] age > 48 Final Disposition User Call EMS 911 Now [place], Therapist, sports, [employer]

## 2014-05-27 NOTE — Telephone Encounter (Signed)
MSG left to call the office      KP 

## 2014-05-27 NOTE — ED Notes (Signed)
Pt presents to department for evaluation of R sided rib pain radiating to back. Ongoing since December. States pain increases with movement. 9/10 pain upon arrival. Pt is alert and oriented x4.

## 2014-05-28 LAB — URINE CULTURE
COLONY COUNT: NO GROWTH
CULTURE: NO GROWTH

## 2014-06-09 ENCOUNTER — Ambulatory Visit (INDEPENDENT_AMBULATORY_CARE_PROVIDER_SITE_OTHER): Payer: Self-pay | Admitting: General Surgery

## 2014-06-09 DIAGNOSIS — K8020 Calculus of gallbladder without cholecystitis without obstruction: Secondary | ICD-10-CM | POA: Diagnosis not present

## 2014-06-09 NOTE — H&P (Signed)
History of Present Illness Ralene Ok MD; 06/09/2014 9:59 AM) Patient words: gallbladder.  The patient is a 64 year old female who presents for evaluation of gall stones. The patient is a 64 year old female who is referred by Dr. Etter Sjogren for evaluation of gallbladder sludge. Patient was recently seen in the ER secondary right subcostal pain. Patient underwent cardiac as well as gallbladder workup. Cardiac evaluation was negative. Patient would ultrasound revealed sludge in the gallbladder however no signs of acute cholecystitis. Since being seen ER patient has stayed off of high fatty foods and feels dramatically better.   Other Problems Elbert Ewings, CMA; 06/09/2014 9:38 AM) Back Pain Hemorrhoids High blood pressure  Past Surgical History Elbert Ewings, CMA; 06/09/2014 9:38 AM) No pertinent past surgical history  Diagnostic Studies History Elbert Ewings, CMA; 06/09/2014 9:38 AM) Colonoscopy within last year Mammogram within last year Pap Smear 1-5 years ago  Allergies Elbert Ewings, CMA; 06/09/2014 9:39 AM) No Known Drug Allergies03/10/2014  Medication History Elbert Ewings, CMA; 06/09/2014 9:41 AM) Hydrochlorothiazide (25MG  Tablet, Oral) Active. Hydrocodone-Acetaminophen (5-325MG  Tablet, Oral) Active. Aspirin EC (81MG  Tablet DR, Oral) Active. Calcium Carbonate (600MG  Tablet, Oral daily) Active. Fish Oil (1000MG  Capsule DR, Oral daily) Active. Naproxen Sodium (220MG  Capsule, Oral) Active. Potassium (99MG  Tablet, Oral) Active. Medications Reconciled  Social History Elbert Ewings, [place]; 06/09/2014 9:38 AM) Caffeine use Carbonated beverages, Coffee, Tea. No alcohol use No drug use Tobacco use Never smoker.  Family History Elbert Ewings, [place]; 06/09/2014 9:38 AM) Alcohol Abuse Father. Breast Cancer Mother, Sister. Cancer Mother, Sister. Cervical Cancer Mother. Colon Cancer Sister. Heart disease in female family member before age 9 Hypertension Brother, Father,  Mother, Sister. Melanoma Mother.  Pregnancy / Birth History Elbert Ewings, CMA; 06/09/2014 9:39 AM) Age at menarche 36 years. Age of menopause 22-50 Gravida 3 Maternal age 66-20 Para 3  Review of Systems Elbert Ewings CMA; 06/09/2014 9:39 AM) General Present- Weight Gain. Not Present- Appetite Loss, Chills, Fatigue, Fever, Night Sweats and Weight Loss. Skin Present- Dryness. Not Present- Change in Wart/Mole, Hives, Jaundice, New Lesions, Non-Healing Wounds, Rash and Ulcer. HEENT Present- Seasonal Allergies and Wears glasses/contact lenses. Not Present- Earache, Hearing Loss, Hoarseness, Nose Bleed, Oral Ulcers, Ringing in the Ears, Sinus Pain, Sore Throat, Visual Disturbances and Yellow Eyes. Respiratory Present- Snoring. Not Present- Bloody sputum, Chronic Cough, Difficulty Breathing and Wheezing. Breast Not Present- Breast Mass, Breast Pain, Nipple Discharge and Skin Changes. Cardiovascular Not Present- Chest Pain, Difficulty Breathing Lying Down, Leg Cramps, Palpitations, Rapid Heart Rate, Shortness of Breath and Swelling of Extremities. Gastrointestinal Present- Abdominal Pain. Not Present- Bloating, Bloody Stool, Change in Bowel Habits, Chronic diarrhea, Constipation, Difficulty Swallowing, Excessive gas, Gets full quickly at meals, Hemorrhoids, Indigestion, Nausea, Rectal Pain and Vomiting. Female Genitourinary Not Present- Frequency, Nocturia, Painful Urination, Pelvic Pain and Urgency. Musculoskeletal Not Present- Back Pain, Joint Pain, Joint Stiffness, Muscle Pain, Muscle Weakness and Swelling of Extremities. Neurological Not Present- Decreased Memory, Fainting, Headaches, Numbness, Seizures, Tingling, Tremor, Trouble walking and Weakness. Psychiatric Not Present- Anxiety, Bipolar, Change in Sleep Pattern, Depression, Fearful and Frequent crying. Endocrine Not Present- Cold Intolerance, Excessive Hunger, Hair Changes, Heat Intolerance, Hot flashes and New Diabetes. Hematology Not  Present- Easy Bruising, Excessive bleeding, Gland problems, HIV and Persistent Infections.   Vitals Elbert Ewings CMA; 06/09/2014 9:42 AM) 06/09/2014 9:41 AM Weight: 198 lb Height: 65in Body Surface Area: 2.03 m Body Mass Index: 32.95 kg/m Temp.: 96.14F(Temporal)  Pulse: 97 (Regular)  Resp.: 16 (Unlabored)  BP: 132/78 (Sitting, Left Arm, Standard)  Physical Exam Ralene Ok MD; 06/09/2014 9:47 AM) General Mental Status-Alert. General Appearance-Consistent with stated age. Hydration-Well hydrated. Voice-Normal.  Head and Neck Head-normocephalic, atraumatic with no lesions or palpable masses.  Chest and Lung Exam Chest and lung exam reveals -quiet, even and easy respiratory effort with no use of accessory muscles and on auscultation, normal breath sounds, no adventitious sounds and normal vocal resonance. Inspection Chest Wall - Normal. Back - normal.  Cardiovascular Cardiovascular examination reveals -on palpation PMI is normal in location and amplitude, no palpable S3 or S4. Normal cardiac borders., normal heart sounds, regular rate and rhythm with no murmurs, carotid auscultation reveals no bruits and normal pedal pulses bilaterally.  Abdomen Inspection Normal Exam - No Hernias. Skin - Scar - no surgical scars. Palpation/Percussion Normal exam - Soft, Non Tender, No Rebound tenderness, No Rigidity (guarding) and No hepatosplenomegaly. Auscultation Normal exam - Bowel sounds normal.  Neurologic Neurologic evaluation reveals -alert and oriented x 3 with no impairment of recent or remote memory. Mental Status-Normal.  Musculoskeletal Normal Exam - Left-Upper Extremity Strength Normal and Lower Extremity Strength Normal. Normal Exam - Right-Upper Extremity Strength Normal, Lower Extremity Weakness.    Assessment & Plan Ralene Ok MD; 06/09/2014 9:59 AM) SYMPTOMATIC CHOLELITHIASIS (574.20  K80.20) Impression: 64 year old female  with likely symptomatic cholelithiasis  1. Will proceed to the operating room for a laparoscopic cholecystectomy 2. Risks and benefits were discussed with the patient to generally include, but not limited to: infection, bleeding, possible need for post op ERCP, damage to the bile ducts, bile leak, and possible need for further surgery. Alternatives were offered and described. All questions were answered and the patient voiced understanding of the procedure and wishes to proceed at this point with a laparoscopic cholecystectomy

## 2014-06-18 NOTE — Pre-Procedure Instructions (Signed)
Mindy Anderson VFIEPPI  06/18/2014   Your procedure is scheduled on: Wednesday, June 25, 2014 at 8:30 AM  Report to [facility] Admitting at 6:30 AM.  Call this number if you have problems the morning of surgery: [phone]   Remember:   Do not eat food or drink liquids after midnight Tuesday, June 24, 2014   Take these medicines the morning of surgery with A SIP OF WATER: none  Stop taking Aspirin, vitamins, and herbal medications. Do not take any NSAIDs ie: Ibuprofen, Advil, Naproxen or any medication containing Aspirin; stop now.   Do not wear jewelry, make-up or nail polish.  Do not wear lotions, powders, or perfumes. You may not wear deodorant.  Do not shave 48 hours prior to surgery.   Do not bring valuables to the hospital.  [facility] is not responsible for any belongings or valuables.               Contacts, dentures or bridgework may not be worn into surgery.  Leave suitcase in the car. After surgery it may be brought to your room.  For patients admitted to the hospital, discharge time is determined by your treatment team.               Patients discharged the day of surgery will not be allowed to drive home.  Name and phone number of your driver:   Special Instructions:  Special Instructions:Special Instructions: [facility] - Preparing for Surgery  Before surgery, you can play an important role.  Because skin is not sterile, your skin needs to be as free of germs as possible.  You can reduce the number of germs on you skin by washing with CHG (chlorahexidine gluconate) soap before surgery.  CHG is an antiseptic cleaner which kills germs and bonds with the skin to continue killing germs even after washing.  Please DO NOT use if you have an allergy to CHG or antibacterial soaps.  If your skin becomes reddened/irritated stop using the CHG and inform your nurse when you arrive at Short Stay.  Do not shave (including legs and underarms) for at least 48 hours prior to  the first CHG shower.  You may shave your face.  Please follow these instructions carefully:   1.  Shower with CHG Soap the night before surgery and the morning of Surgery.  2.  If you choose to wash your hair, wash your hair first as usual with your normal shampoo.  3.  After you shampoo, rinse your hair and body thoroughly to remove the Shampoo.  4.  Use CHG as you would any other liquid soap.  You can apply chg directly  to the skin and wash gently with scrungie or a clean washcloth.  5.  Apply the CHG Soap to your body ONLY FROM THE NECK DOWN.  Do not use on open wounds or open sores.  Avoid contact with your eyes, ears, mouth and genitals (private parts).  Wash genitals (private parts) with your normal soap.  6.  Wash thoroughly, paying special attention to the area where your surgery will be performed.  7.  Thoroughly rinse your body with warm water from the neck down.  8.  DO NOT shower/wash with your normal soap after using and rinsing off the CHG Soap.  9.  Pat yourself dry with a clean towel.            10.  Wear clean pajamas.  11.  Place clean sheets on your bed the night of your first shower and do not sleep with pets.  Day of Surgery  Do not apply any lotions/deodorants the morning of surgery.  Please wear clean clothes to the hospital/surgery center.   Please read over the following fact sheets that you were given: Pain Booklet, Coughing and Deep Breathing and Surgical Site Infection Prevention

## 2014-06-19 ENCOUNTER — Encounter (HOSPITAL_COMMUNITY)
Admission: RE | Admit: 2014-06-19 | Discharge: 2014-06-19 | Disposition: A | Discharge status: Home / Self Care | Payer: Commercial Managed Care - HMO | Source: Ambulatory Visit | Attending: General Surgery | Admitting: General Surgery

## 2014-06-19 ENCOUNTER — Encounter (HOSPITAL_COMMUNITY): Payer: Self-pay

## 2014-06-19 DIAGNOSIS — Z01812 Encounter for preprocedural laboratory examination: Secondary | ICD-10-CM | POA: Insufficient documentation

## 2014-06-19 HISTORY — DX: Other specified postprocedural states: R11.2

## 2014-06-19 HISTORY — DX: Adverse effect of unspecified anesthetic, initial encounter: T41.45XA

## 2014-06-19 HISTORY — DX: Other complications of anesthesia, initial encounter: T88.59XA

## 2014-06-19 HISTORY — DX: Family history of other specified conditions: Z84.89

## 2014-06-19 HISTORY — DX: Unspecified osteoarthritis, unspecified site: M19.90

## 2014-06-19 HISTORY — DX: Other specified postprocedural states: Z98.890

## 2014-06-19 LAB — BASIC METABOLIC PANEL
ANION GAP: 9 (ref 5–15)
BUN: 10 mg/dL (ref 6–23)
CHLORIDE: 102 mmol/L (ref 96–112)
CO2: 28 mmol/L (ref 19–32)
Calcium: 9.3 mg/dL (ref 8.4–10.5)
Creatinine, Ser: 0.69 mg/dL (ref 0.50–1.10)
GFR calc Af Amer: >90 mL/min (ref >90)
GFR calc non Af Amer: 89 mL/min — ABNORMAL LOW (ref >90)
GLUCOSE: 95 mg/dL (ref 70–99)
POTASSIUM: 3.6 mmol/L (ref 3.5–5.1)
SODIUM: 139 mmol/L (ref 135–145)

## 2014-06-19 LAB — CBC
HCT: 42.2 % (ref 36.0–46.0)
Hemoglobin: 14 g/dL (ref 12.0–15.0)
MCH: 30 pg (ref 26.0–34.0)
MCHC: 33.2 g/dL (ref 30.0–36.0)
MCV: 90.6 fL (ref 78.0–100.0)
PLATELETS: 179 10*3/uL (ref 150–400)
RBC: 4.66 MIL/uL (ref 3.87–5.11)
RDW: 12.9 % (ref 11.5–15.5)
WBC: 6.7 10*3/uL (ref 4.0–10.5)

## 2014-06-19 NOTE — Progress Notes (Signed)
   06/19/14 0843  OBSTRUCTIVE SLEEP APNEA  Have you ever been diagnosed with sleep apnea through a sleep study? No  Do you snore loudly (loud enough to be heard through closed doors)?  1  Do you often feel tired, fatigued, or sleepy during the daytime? 1  Has anyone observed you stop breathing during your sleep? 1  Do you have, or are you being treated for high blood pressure? 1  BMI more than 35 kg/m2? 0  Age over 65 years old? 1  Neck circumference greater than 40 cm/16 inches? 0  Gender: 0

## 2014-06-24 MED ORDER — CEFAZOLIN SODIUM-DEXTROSE 2-3 GM-% IV SOLR
2.0000 g | INTRAVENOUS | Status: DC
  Filled 2014-06-24: qty 50

## 2014-06-24 MED ORDER — CHLORHEXIDINE GLUCONATE 4 % EX LIQD
1.0000 "application " | Freq: Once | CUTANEOUS | Status: DC
  Filled 2014-06-24: qty 15

## 2014-06-25 ENCOUNTER — Encounter (HOSPITAL_COMMUNITY): Payer: Self-pay | Admitting: *Deleted

## 2014-06-25 ENCOUNTER — Ambulatory Visit (HOSPITAL_COMMUNITY)
Admission: RE | Admit: 2014-06-25 | Discharge: 2014-06-25 | Disposition: A | Discharge status: Home / Self Care | Payer: Commercial Managed Care - HMO | Source: Ambulatory Visit | Attending: General Surgery | Admitting: General Surgery

## 2014-06-25 ENCOUNTER — Ambulatory Visit (HOSPITAL_COMMUNITY): Payer: Commercial Managed Care - HMO | Admitting: Anesthesiology

## 2014-06-25 ENCOUNTER — Encounter (HOSPITAL_COMMUNITY)
Admission: RE | Disposition: A | Discharge status: Home / Self Care | Payer: Self-pay | Source: Ambulatory Visit | Attending: General Surgery

## 2014-06-25 DIAGNOSIS — R1011 Right upper quadrant pain: Secondary | ICD-10-CM | POA: Diagnosis present

## 2014-06-25 DIAGNOSIS — K8020 Calculus of gallbladder without cholecystitis without obstruction: Secondary | ICD-10-CM | POA: Diagnosis not present

## 2014-06-25 DIAGNOSIS — I10 Essential (primary) hypertension: Secondary | ICD-10-CM | POA: Insufficient documentation

## 2014-06-25 DIAGNOSIS — M1990 Unspecified osteoarthritis, unspecified site: Secondary | ICD-10-CM | POA: Diagnosis not present

## 2014-06-25 DIAGNOSIS — K811 Chronic cholecystitis: Secondary | ICD-10-CM | POA: Diagnosis not present

## 2014-06-25 HISTORY — PX: CHOLECYSTECTOMY: SHX55

## 2014-06-25 SURGERY — LAPAROSCOPIC CHOLECYSTECTOMY
Anesthesia: General | Site: Abdomen

## 2014-06-25 MED ORDER — SODIUM CHLORIDE 0.9 % IJ SOLN
INTRAMUSCULAR | Status: AC
  Filled 2014-06-25: qty 10

## 2014-06-25 MED ORDER — DEXAMETHASONE SODIUM PHOSPHATE 4 MG/ML IJ SOLN
INTRAMUSCULAR | Status: DC | PRN
  Administered 2014-06-25: 8 mg via INTRAVENOUS

## 2014-06-25 MED ORDER — NEOSTIGMINE METHYLSULFATE 10 MG/10ML IV SOLN
INTRAVENOUS | Status: DC | PRN
  Administered 2014-06-25: 3 mg via INTRAVENOUS

## 2014-06-25 MED ORDER — HYDROMORPHONE HCL 1 MG/ML IJ SOLN
INTRAMUSCULAR | Status: AC
Start: 2014-06-25 — End: 2014-06-25
  Administered 2014-06-25: 0.25 mg via INTRAVENOUS
  Filled 2014-06-25: qty 1

## 2014-06-25 MED ORDER — SCOPOLAMINE 1 MG/3DAYS TD PT72
MEDICATED_PATCH | TRANSDERMAL | Status: AC
  Filled 2014-06-25: qty 1

## 2014-06-25 MED ORDER — ARTIFICIAL TEARS OP OINT
TOPICAL_OINTMENT | OPHTHALMIC | Status: AC
  Filled 2014-06-25: qty 3.5

## 2014-06-25 MED ORDER — LIDOCAINE HCL (CARDIAC) 20 MG/ML IV SOLN
INTRAVENOUS | Status: AC
  Filled 2014-06-25: qty 5

## 2014-06-25 MED ORDER — LACTATED RINGERS IV SOLN
INTRAVENOUS | Status: DC | PRN
  Administered 2014-06-25 (×2): via INTRAVENOUS

## 2014-06-25 MED ORDER — SODIUM CHLORIDE 0.9 % IJ SOLN: 3.0000 mL | INTRAMUSCULAR | Status: DC | PRN

## 2014-06-25 MED ORDER — SUCCINYLCHOLINE CHLORIDE 20 MG/ML IJ SOLN
INTRAMUSCULAR | Status: AC
  Filled 2014-06-25: qty 1

## 2014-06-25 MED ORDER — ACETAMINOPHEN 650 MG RE SUPP: 650.0000 mg | RECTAL | Status: DC | PRN

## 2014-06-25 MED ORDER — SODIUM CHLORIDE 0.9 % IV SOLN: 250.0000 mL | INTRAVENOUS | Status: DC | PRN

## 2014-06-25 MED ORDER — ROCURONIUM BROMIDE 100 MG/10ML IV SOLN
INTRAVENOUS | Status: DC | PRN
  Administered 2014-06-25: 20 mg via INTRAVENOUS

## 2014-06-25 MED ORDER — KETOROLAC TROMETHAMINE 30 MG/ML IJ SOLN
INTRAMUSCULAR | Status: AC
  Filled 2014-06-25: qty 1

## 2014-06-25 MED ORDER — PROMETHAZINE HCL 25 MG/ML IJ SOLN
6.2500 mg | INTRAMUSCULAR | Status: DC | PRN
Start: 2014-06-25 — End: 2014-06-25

## 2014-06-25 MED ORDER — BUPIVACAINE HCL (PF) 0.25 % IJ SOLN
INTRAMUSCULAR | Status: AC
  Filled 2014-06-25: qty 30

## 2014-06-25 MED ORDER — PROPOFOL 10 MG/ML IV BOLUS
INTRAVENOUS | Status: AC
  Filled 2014-06-25: qty 20

## 2014-06-25 MED ORDER — DEXAMETHASONE SODIUM PHOSPHATE 4 MG/ML IJ SOLN
INTRAMUSCULAR | Status: AC
  Filled 2014-06-25: qty 2

## 2014-06-25 MED ORDER — HYDROMORPHONE HCL 1 MG/ML IJ SOLN
0.2500 mg | INTRAMUSCULAR | Status: DC | PRN
  Administered 2014-06-25: 0.25 mg via INTRAVENOUS
  Administered 2014-06-25: 0.5 mg via INTRAVENOUS
  Administered 2014-06-25: 0.25 mg via INTRAVENOUS

## 2014-06-25 MED ORDER — GLYCOPYRROLATE 0.2 MG/ML IJ SOLN
INTRAMUSCULAR | Status: AC
  Filled 2014-06-25: qty 3

## 2014-06-25 MED ORDER — LIDOCAINE HCL (CARDIAC) 20 MG/ML IV SOLN
INTRAVENOUS | Status: DC | PRN
  Administered 2014-06-25: 60 mg via INTRAVENOUS

## 2014-06-25 MED ORDER — PHENYLEPHRINE 40 MCG/ML (10ML) SYRINGE FOR IV PUSH (FOR BLOOD PRESSURE SUPPORT)
PREFILLED_SYRINGE | INTRAVENOUS | Status: AC
  Filled 2014-06-25: qty 10

## 2014-06-25 MED ORDER — ACETAMINOPHEN 325 MG PO TABS: 650.0000 mg | ORAL_TABLET | ORAL | Status: DC | PRN

## 2014-06-25 MED ORDER — GLYCOPYRROLATE 0.2 MG/ML IJ SOLN
INTRAMUSCULAR | Status: DC | PRN
  Administered 2014-06-25: 0.2 mg via INTRAVENOUS
  Administered 2014-06-25: 0.4 mg via INTRAVENOUS

## 2014-06-25 MED ORDER — SCOPOLAMINE 1 MG/3DAYS TD PT72
1.0000 | MEDICATED_PATCH | TRANSDERMAL | Status: DC
  Administered 2014-06-25: 1 via TRANSDERMAL

## 2014-06-25 MED ORDER — NEOSTIGMINE METHYLSULFATE 10 MG/10ML IV SOLN
INTRAVENOUS | Status: AC
  Filled 2014-06-25: qty 1

## 2014-06-25 MED ORDER — SUCCINYLCHOLINE CHLORIDE 20 MG/ML IJ SOLN
INTRAMUSCULAR | Status: DC | PRN
  Administered 2014-06-25: 100 mg via INTRAVENOUS

## 2014-06-25 MED ORDER — ROCURONIUM BROMIDE 50 MG/5ML IV SOLN
INTRAVENOUS | Status: AC
  Filled 2014-06-25: qty 1

## 2014-06-25 MED ORDER — ONDANSETRON HCL 4 MG/2ML IJ SOLN
INTRAMUSCULAR | Status: DC | PRN
  Administered 2014-06-25: 4 mg via INTRAVENOUS

## 2014-06-25 MED ORDER — EPHEDRINE SULFATE 50 MG/ML IJ SOLN
INTRAMUSCULAR | Status: DC | PRN
  Administered 2014-06-25: 10 mg via INTRAVENOUS

## 2014-06-25 MED ORDER — TRAMADOL HCL 50 MG PO TABS: 50.0000 mg | ORAL_TABLET | Freq: Four times a day (QID) | ORAL | Status: DC | PRN

## 2014-06-25 MED ORDER — EPHEDRINE SULFATE 50 MG/ML IJ SOLN
INTRAMUSCULAR | Status: AC
  Filled 2014-06-25: qty 1

## 2014-06-25 MED ORDER — 0.9 % SODIUM CHLORIDE (POUR BTL) OPTIME
TOPICAL | Status: DC | PRN
  Administered 2014-06-25: 1000 mL

## 2014-06-25 MED ORDER — FENTANYL CITRATE 0.05 MG/ML IJ SOLN
INTRAMUSCULAR | Status: DC | PRN
  Administered 2014-06-25: 100 ug via INTRAVENOUS
  Administered 2014-06-25: 50 ug via INTRAVENOUS

## 2014-06-25 MED ORDER — KETOROLAC TROMETHAMINE 30 MG/ML IJ SOLN
30.0000 mg | Freq: Four times a day (QID) | INTRAMUSCULAR | Status: DC
  Administered 2014-06-25: 30 mg via INTRAVENOUS

## 2014-06-25 MED ORDER — PHENYLEPHRINE HCL 10 MG/ML IJ SOLN
INTRAMUSCULAR | Status: DC | PRN
  Administered 2014-06-25: 80 ug via INTRAVENOUS

## 2014-06-25 MED ORDER — SODIUM CHLORIDE 0.9 % IJ SOLN: 3.0000 mL | Freq: Two times a day (BID) | INTRAMUSCULAR | Status: DC

## 2014-06-25 MED ORDER — SODIUM CHLORIDE 0.9 % IR SOLN
Status: DC | PRN
  Administered 2014-06-25: 1000 mL

## 2014-06-25 MED ORDER — ARTIFICIAL TEARS OP OINT
TOPICAL_OINTMENT | OPHTHALMIC | Status: DC | PRN
  Administered 2014-06-25: 1 via OPHTHALMIC

## 2014-06-25 MED ORDER — MIDAZOLAM HCL 2 MG/2ML IJ SOLN
INTRAMUSCULAR | Status: AC
  Filled 2014-06-25: qty 2

## 2014-06-25 MED ORDER — BUPIVACAINE HCL 0.25 % IJ SOLN
INTRAMUSCULAR | Status: DC | PRN
  Administered 2014-06-25: 4 mL

## 2014-06-25 MED ORDER — FENTANYL CITRATE 0.05 MG/ML IJ SOLN
INTRAMUSCULAR | Status: AC
Start: 2014-06-25 — End: 2014-06-25
  Filled 2014-06-25: qty 5

## 2014-06-25 SURGICAL SUPPLY — 47 items
APL SKNCLS STERI-STRIP NONHPOA (GAUZE/BANDAGES/DRESSINGS) ×1
BAG SPEC RTRVL LRG 6X4 10 (ENDOMECHANICALS)
BENZOIN TINCTURE PRP APPL 2/3 (GAUZE/BANDAGES/DRESSINGS) ×3 IMPLANT
CANISTER SUCTION 2500CC (MISCELLANEOUS) ×3 IMPLANT
CHLORAPREP W/TINT 26ML (MISCELLANEOUS) ×3 IMPLANT
CLIP LIGATING HEMO O LOK GREEN (MISCELLANEOUS) ×6 IMPLANT
CLOSURE WOUND 1/2 X4 (GAUZE/BANDAGES/DRESSINGS) ×1
COVER SURGICAL LIGHT HANDLE (MISCELLANEOUS) ×3 IMPLANT
COVER TRANSDUCER ULTRASND (DRAPES) ×3 IMPLANT
DEVICE TROCAR PUNCTURE CLOSURE (ENDOMECHANICALS) ×3 IMPLANT
DRAPE LAPAROSCOPIC ABDOMINAL (DRAPES) ×3 IMPLANT
ELECT REM PT RETURN 9FT ADLT (ELECTROSURGICAL) ×3
ELECTRODE REM PT RTRN 9FT ADLT (ELECTROSURGICAL) ×1 IMPLANT
GAUZE SPONGE 2X2 8PLY STRL LF (GAUZE/BANDAGES/DRESSINGS) ×1 IMPLANT
GLOVE BIO SURGEON STRL SZ7.5 (GLOVE) ×3 IMPLANT
GLOVE BIOGEL PI IND STRL 7.0 (GLOVE) IMPLANT
GLOVE BIOGEL PI IND STRL 7.5 (GLOVE) IMPLANT
GLOVE BIOGEL PI INDICATOR 7.0 (GLOVE) ×2
GLOVE BIOGEL PI INDICATOR 7.5 (GLOVE) ×2
GLOVE ECLIPSE 7.0 STRL STRAW (GLOVE) ×3 IMPLANT
GLOVE ECLIPSE 7.5 STRL STRAW (GLOVE) ×2 IMPLANT
GLOVE SS N UNI LF 7.0 STRL (GLOVE) ×3 IMPLANT
GOWN STRL REUS W/ TWL LRG LVL3 (GOWN DISPOSABLE) ×2 IMPLANT
GOWN STRL REUS W/ TWL XL LVL3 (GOWN DISPOSABLE) ×1 IMPLANT
GOWN STRL REUS W/TWL LRG LVL3 (GOWN DISPOSABLE) ×6
GOWN STRL REUS W/TWL XL LVL3 (GOWN DISPOSABLE) ×3
KIT BASIN OR (CUSTOM PROCEDURE TRAY) ×3 IMPLANT
KIT ROOM TURNOVER OR (KITS) ×3 IMPLANT
NDL INSUFFLATION 14GA 120MM (NEEDLE) ×1 IMPLANT
NEEDLE INSUFFLATION 14GA 120MM (NEEDLE) ×3 IMPLANT
NS IRRIG 1000ML POUR BTL (IV SOLUTION) ×3 IMPLANT
PAD ARMBOARD 7.5X6 YLW CONV (MISCELLANEOUS) ×6 IMPLANT
POUCH SPECIMEN RETRIEVAL 10MM (ENDOMECHANICALS) IMPLANT
SCISSORS LAP 5X35 DISP (ENDOMECHANICALS) ×3 IMPLANT
SET IRRIG TUBING LAPAROSCOPIC (IRRIGATION / IRRIGATOR) ×3 IMPLANT
SLEEVE ENDOPATH XCEL 5M (ENDOMECHANICALS) ×3 IMPLANT
SPECIMEN JAR SMALL (MISCELLANEOUS) ×3 IMPLANT
SPONGE GAUZE 2X2 STER 10/PKG (GAUZE/BANDAGES/DRESSINGS) ×2
STRIP CLOSURE SKIN 1/2X4 (GAUZE/BANDAGES/DRESSINGS) ×1 IMPLANT
SUT MNCRL AB 3-0 PS2 18 (SUTURE) ×3 IMPLANT
TAPE CLOTH SURG 4X10 WHT LF (GAUZE/BANDAGES/DRESSINGS) ×2 IMPLANT
TOWEL OR 17X24 6PK STRL BLUE (TOWEL DISPOSABLE) ×3 IMPLANT
TOWEL OR 17X26 10 PK STRL BLUE (TOWEL DISPOSABLE) ×3 IMPLANT
TRAY LAPAROSCOPIC (CUSTOM PROCEDURE TRAY) ×3 IMPLANT
TROCAR XCEL NON-BLD 11X100MML (ENDOMECHANICALS) ×3 IMPLANT
TROCAR XCEL NON-BLD 5MMX100MML (ENDOMECHANICALS) ×3 IMPLANT
TUBING INSUFFLATION (TUBING) ×3 IMPLANT

## 2014-06-25 NOTE — Discharge Instructions (Signed)
[facility] ______[facility] °LAPAROSCOPIC SURGERY: POST OP INSTRUCTIONS °Always review your discharge instruction sheet given to you by the facility where your surgery was performed. °IF YOU HAVE DISABILITY OR FAMILY LEAVE FORMS, YOU MUST BRING THEM TO THE OFFICE FOR PROCESSING.   °DO NOT GIVE THEM TO YOUR DOCTOR. ° °1. A prescription for pain medication may be given to you upon discharge.  Take your pain medication as prescribed, if needed.  If narcotic pain medicine is not needed, then you may take acetaminophen (Tylenol) or ibuprofen (Advil) as needed. °2. Take your usually prescribed medications unless otherwise directed. °3. If you need a refill on your pain medication, please contact your pharmacy.  They will contact our office to request authorization. Prescriptions will not be filled after 5pm or on week-ends. °4. You should follow a light diet the first few days after arrival home, such as soup and crackers, etc.  Be sure to include lots of fluids daily. °5. Most patients will experience some swelling and bruising in the area of the incisions.  Ice packs will help.  Swelling and bruising can take several days to resolve.  °6. It is common to experience some constipation if taking pain medication after surgery.  Increasing fluid intake and taking a stool softener (such as Colace) will usually help or prevent this problem from occurring.  A mild laxative (Milk of Magnesia or Miralax) should be taken according to package instructions if there are no bowel movements after 48 hours. °7. Unless discharge instructions indicate otherwise, you may remove your bandages 24-48 hours after surgery, and you may shower at that time.  You may have steri-strips (small skin tapes) in place directly over the incision.  These strips should be left on the skin for 7-10 days.  If your surgeon used skin glue on the incision, you may shower in 24 hours.  The glue will flake off over the next 2-3 weeks.  Any sutures or  staples will be removed at the office during your follow-up visit. °8. ACTIVITIES:  You may resume regular (light) daily activities beginning the next day--such as daily self-care, walking, climbing stairs--gradually increasing activities as tolerated.  You may have sexual intercourse when it is comfortable.  Refrain from any heavy lifting or straining until approved by your doctor. °a. You may drive when you are no longer taking prescription pain medication, you can comfortably wear a seatbelt, and you can safely maneuver your car and apply brakes. °b. RETURN TO WORK:  __________________________________________________________ °9. You should see your doctor in the office for a follow-up appointment approximately 2-3 weeks after your surgery.  Make sure that you call for this appointment within a day or two after you arrive home to insure a convenient appointment time. °10. OTHER INSTRUCTIONS: __________________________________________________________________________________________________________________________ __________________________________________________________________________________________________________________________ °WHEN TO CALL YOUR DOCTOR: °1. Fever over 101.0 °2. Inability to urinate °3. Continued bleeding from incision. °4. Increased pain, redness, or drainage from the incision. °5. Increasing abdominal pain ° °The clinic staff is available to answer your questions during regular business hours.  Please don’t hesitate to call and ask to speak to one of the nurses for clinical concerns.  If you have a medical emergency, go to the nearest emergency room or call 911.  A surgeon from [facility] is always on call at the hospital. °[address], [address], [place], [place]  [address] ? [address] [address], [place], [place]   [address] °[phone] ? [phone] ? FAX [phone] °Web site:   [email or web address] °

## 2014-06-25 NOTE — H&P (View-Only) (Signed)
History of Present Illness Ralene Ok MD; 06/09/2014 9:59 AM) Patient words: gallbladder.  The patient is a 64 year old female who presents for evaluation of gall stones. The patient is a 64 year old female who is referred by Dr. Etter Sjogren for evaluation of gallbladder sludge. Patient was recently seen in the ER secondary right subcostal pain. Patient underwent cardiac as well as gallbladder workup. Cardiac evaluation was negative. Patient would ultrasound revealed sludge in the gallbladder however no signs of acute cholecystitis. Since being seen ER patient has stayed off of high fatty foods and feels dramatically better.   Other Problems Elbert Ewings, CMA; 06/09/2014 9:38 AM) Back Pain Hemorrhoids High blood pressure  Past Surgical History Elbert Ewings, CMA; 06/09/2014 9:38 AM) No pertinent past surgical history  Diagnostic Studies History Elbert Ewings, CMA; 06/09/2014 9:38 AM) Colonoscopy within last year Mammogram within last year Pap Smear 1-5 years ago  Allergies Elbert Ewings, CMA; 06/09/2014 9:39 AM) No Known Drug Allergies03/10/2014  Medication History Elbert Ewings, CMA; 06/09/2014 9:41 AM) Hydrochlorothiazide (25MG  Tablet, Oral) Active. Hydrocodone-Acetaminophen (5-325MG  Tablet, Oral) Active. Aspirin EC (81MG  Tablet DR, Oral) Active. Calcium Carbonate (600MG  Tablet, Oral daily) Active. Fish Oil (1000MG  Capsule DR, Oral daily) Active. Naproxen Sodium (220MG  Capsule, Oral) Active. Potassium (99MG  Tablet, Oral) Active. Medications Reconciled  Social History Elbert Ewings, [place]; 06/09/2014 9:38 AM) Caffeine use Carbonated beverages, Coffee, Tea. No alcohol use No drug use Tobacco use Never smoker.  Family History Elbert Ewings, [place]; 06/09/2014 9:38 AM) Alcohol Abuse Father. Breast Cancer Mother, Sister. Cancer Mother, Sister. Cervical Cancer Mother. Colon Cancer Sister. Heart disease in female family member before age 79 Hypertension Brother, Father,  Mother, Sister. Melanoma Mother.  Pregnancy / Birth History Elbert Ewings, CMA; 06/09/2014 9:39 AM) Age at menarche 30 years. Age of menopause 29-50 Gravida 3 Maternal age 78-20 Para 3  Review of Systems Elbert Ewings CMA; 06/09/2014 9:39 AM) General Present- Weight Gain. Not Present- Appetite Loss, Chills, Fatigue, Fever, Night Sweats and Weight Loss. Skin Present- Dryness. Not Present- Change in Wart/Mole, Hives, Jaundice, New Lesions, Non-Healing Wounds, Rash and Ulcer. HEENT Present- Seasonal Allergies and Wears glasses/contact lenses. Not Present- Earache, Hearing Loss, Hoarseness, Nose Bleed, Oral Ulcers, Ringing in the Ears, Sinus Pain, Sore Throat, Visual Disturbances and Yellow Eyes. Respiratory Present- Snoring. Not Present- Bloody sputum, Chronic Cough, Difficulty Breathing and Wheezing. Breast Not Present- Breast Mass, Breast Pain, Nipple Discharge and Skin Changes. Cardiovascular Not Present- Chest Pain, Difficulty Breathing Lying Down, Leg Cramps, Palpitations, Rapid Heart Rate, Shortness of Breath and Swelling of Extremities. Gastrointestinal Present- Abdominal Pain. Not Present- Bloating, Bloody Stool, Change in Bowel Habits, Chronic diarrhea, Constipation, Difficulty Swallowing, Excessive gas, Gets full quickly at meals, Hemorrhoids, Indigestion, Nausea, Rectal Pain and Vomiting. Female Genitourinary Not Present- Frequency, Nocturia, Painful Urination, Pelvic Pain and Urgency. Musculoskeletal Not Present- Back Pain, Joint Pain, Joint Stiffness, Muscle Pain, Muscle Weakness and Swelling of Extremities. Neurological Not Present- Decreased Memory, Fainting, Headaches, Numbness, Seizures, Tingling, Tremor, Trouble walking and Weakness. Psychiatric Not Present- Anxiety, Bipolar, Change in Sleep Pattern, Depression, Fearful and Frequent crying. Endocrine Not Present- Cold Intolerance, Excessive Hunger, Hair Changes, Heat Intolerance, Hot flashes and New Diabetes. Hematology Not  Present- Easy Bruising, Excessive bleeding, Gland problems, HIV and Persistent Infections.   Vitals Elbert Ewings CMA; 06/09/2014 9:42 AM) 06/09/2014 9:41 AM Weight: 198 lb Height: 65in Body Surface Area: 2.03 m Body Mass Index: 32.95 kg/m Temp.: 96.52F(Temporal)  Pulse: 97 (Regular)  Resp.: 16 (Unlabored)  BP: 132/78 (Sitting, Left Arm, Standard)  Physical Exam Ralene Ok MD; 06/09/2014 9:47 AM) General Mental Status-Alert. General Appearance-Consistent with stated age. Hydration-Well hydrated. Voice-Normal.  Head and Neck Head-normocephalic, atraumatic with no lesions or palpable masses.  Chest and Lung Exam Chest and lung exam reveals -quiet, even and easy respiratory effort with no use of accessory muscles and on auscultation, normal breath sounds, no adventitious sounds and normal vocal resonance. Inspection Chest Wall - Normal. Back - normal.  Cardiovascular Cardiovascular examination reveals -on palpation PMI is normal in location and amplitude, no palpable S3 or S4. Normal cardiac borders., normal heart sounds, regular rate and rhythm with no murmurs, carotid auscultation reveals no bruits and normal pedal pulses bilaterally.  Abdomen Inspection Normal Exam - No Hernias. Skin - Scar - no surgical scars. Palpation/Percussion Normal exam - Soft, Non Tender, No Rebound tenderness, No Rigidity (guarding) and No hepatosplenomegaly. Auscultation Normal exam - Bowel sounds normal.  Neurologic Neurologic evaluation reveals -alert and oriented x 3 with no impairment of recent or remote memory. Mental Status-Normal.  Musculoskeletal Normal Exam - Left-Upper Extremity Strength Normal and Lower Extremity Strength Normal. Normal Exam - Right-Upper Extremity Strength Normal, Lower Extremity Weakness.    Assessment & Plan Ralene Ok MD; 06/09/2014 9:59 AM) SYMPTOMATIC CHOLELITHIASIS (574.20  K80.20) Impression: 64 year old female  with likely symptomatic cholelithiasis  1. Will proceed to the operating room for a laparoscopic cholecystectomy 2. Risks and benefits were discussed with the patient to generally include, but not limited to: infection, bleeding, possible need for post op ERCP, damage to the bile ducts, bile leak, and possible need for further surgery. Alternatives were offered and described. All questions were answered and the patient voiced understanding of the procedure and wishes to proceed at this point with a laparoscopic cholecystectomy

## 2014-06-25 NOTE — Anesthesia Preprocedure Evaluation (Signed)
Anesthesia Evaluation    History of Anesthesia Complications (+) PONV and history of anesthetic complications  Airway        Dental   Pulmonary          Cardiovascular hypertension,     Neuro/Psych negative neurological ROS  negative psych ROS   GI/Hepatic Neg liver ROS,   Endo/Other  negative endocrine ROS  Renal/GU negative Renal ROS     Musculoskeletal   Abdominal   Peds  Hematology negative hematology ROS (+)   Anesthesia Other Findings   Reproductive/Obstetrics                             Anesthesia Physical Anesthesia Plan  ASA: II  Anesthesia Plan: General   Post-op Pain Management:    Induction: Intravenous  Airway Management Planned: Oral ETT  Additional Equipment:   Intra-op Plan:   Post-operative Plan: Extubation in OR  Informed Consent:   Plan Discussed with:   Anesthesia Plan Comments:         Anesthesia Quick Evaluation

## 2014-06-25 NOTE — Transfer of Care (Signed)
Immediate Anesthesia Transfer of Care Note  Patient: Mindy Anderson [id number]  Procedure(s) Performed: Procedure(s): LAPAROSCOPIC CHOLECYSTECTOMY (N/A)  Patient Location: PACU  Anesthesia Type:General  Level of Consciousness: awake, alert , oriented and patient cooperative  Airway & Oxygen Therapy: Patient Spontanous Breathing and Patient connected to nasal cannula oxygen  Post-op Assessment: Report given to RN, Post -op Vital signs reviewed and stable and Patient moving all extremities  Post vital signs: Reviewed and stable  Last Vitals:  Filed Vitals:   06/25/14 0938  BP:   Pulse:   Temp: 36.5 C  Resp:     Complications: No apparent anesthesia complications

## 2014-06-25 NOTE — Op Note (Signed)
06/25/2014  9:23 AM  PATIENT:  Mindy Anderson [id number]  64 y.o. female  PRE-OPERATIVE DIAGNOSIS:  SYMPTOMATIC GALLSTONES  POST-OPERATIVE DIAGNOSIS:  SYMPTOMATIC GALLSTONES  PROCEDURE:  Procedure(s): LAPAROSCOPIC CHOLECYSTECTOMY (N/A)  SURGEON:  Surgeon(s) and Role:    * Ralene Ok, MD - Primary  ANESTHESIA:   local and general  EBL:<5cc   Total I/O In: 1000 [I.V.:1000] Out: -   BLOOD ADMINISTERED:none  DRAINS: none   LOCAL MEDICATIONS USED:  BUPIVICAINE   SPECIMEN:  Source of Specimen:  gallbladder  DISPOSITION OF SPECIMEN:  PATHOLOGY  COUNTS:  YES  TOURNIQUET:  * No tourniquets in log *  DICTATION: .Dragon Dictation  Counts: reported as correct x 2   Findings:chronically inflammed gallbaldder  Indications for procedure: Pt is a 64 y/o F with RUQ pain and seen to have gallstones.   Details of the procedure: The patient was taken to the operating and placed in the supine position with bilateral SCDs in place. A time out was called and all facts were verified. A pneumoperitoneum was obtained via A Veress needle technique to a pressure of 65mm of mercury. A 71mm trochar was then placed in the right upper quadrant under visualization, and there were no injuries to any abdominal organs. A 11 mm port was then placed in the umbilical region after infiltrating with local anesthesia under direct visualization. A second epigastric port was placed under direct visualization. The gallbladder was identified and retracted, the peritoneum was then sharply dissected from the gallbladder and this dissection was carried down to Calot's triangle. The cystic duct was identified and stripped away circumferentially and seen going into the gallbladder 360, the critical angle was obtained. 2 clips were placed proximally one distally and the cystic duct transected. The cystic artery was identified and 2 clips placed proximally and one distally and transected. We then proceeded to remove the  gallbladder off the hepatic fossa with Bovie cautery. A retrieval bag was then placed in the abdomen and gallbladder placed in the bag. The hepatic fossa was then reexamined and hemostasis was achieved with Bovie cautery and was excellent at this portion of the case. The subhepatic fossa and perihepatic fossa was then irrigated until the effluent was clear. The 11 mm trocar fascia was reapproximated with the Endo Close #1 Vicryl x1. The pneumoperitoneum was evacuated and all trochars removed under direct visulalization. The skin was then closed with 4-0 Monocryl and the skin dressed with Steri-Strips, gauze, and tape. The patient was awaken from general anesthesia and taken to the recovery room in stable condition.    PLAN OF CARE: Discharge to home after PACU  PATIENT DISPOSITION:  PACU - hemodynamically stable.   Delay start of Pharmacological VTE agent (>24hrs) due to surgical blood loss or risk of bleeding: not applicable

## 2014-06-25 NOTE — Progress Notes (Signed)
Patient a little dizzy but states she is ready to go home. She was put in a wheelchair and pushed around to make sure she wouldn't get sick. Patient is burping a lot, states not nauseated just a little dizzy. Patient also sipping on soda to make sure she isnt going to throw up. Patient states she is ready for her IV to come out and go home. Verified multiple times.

## 2014-06-25 NOTE — Anesthesia Postprocedure Evaluation (Signed)
Anesthesia Post Note  Patient: Mindy Anderson  Procedure(s) Performed: Procedure(s) (LRB): LAPAROSCOPIC CHOLECYSTECTOMY (N/A)  Anesthesia type: general  Patient location: PACU  Post pain: Pain level controlled  Post assessment: Patient's Cardiovascular Status Stable  Last Vitals:  Filed Vitals:   06/25/14 1040  BP: 109/59  Pulse: 73  Temp:   Resp: 21    Post vital signs: Reviewed and stable  Level of consciousness: sedated  Complications: No apparent anesthesia complications

## 2014-06-25 NOTE — Progress Notes (Signed)
Report given to Megan RN

## 2014-06-25 NOTE — Anesthesia Procedure Notes (Signed)
Procedure Name: Intubation Date/Time: 06/25/2014 8:31 AM Performed by: Julian Reil Pre-anesthesia Checklist: Patient identified, Emergency Drugs available, Suction available and Patient being monitored Patient Re-evaluated:Patient Re-evaluated prior to inductionOxygen Delivery Method: Circle system utilized Preoxygenation: Pre-oxygenation with 100% oxygen Intubation Type: IV induction Ventilation: Mask ventilation without difficulty and Oral airway inserted - appropriate to patient size Laryngoscope Size: Mac and 3 Grade View: Grade III Tube type: Oral Tube size: 7.0 mm Number of attempts: 2 Airway Equipment and Method: Bougie stylet and LTA kit utilized Placement Confirmation: positive ETCO2,  ETT inserted through vocal cords under direct vision and breath sounds checked- equal and bilateral Secured at: 21 cm Tube secured with: Tape Dental Injury: Injury to lip  Difficulty Due To: Difficulty was anticipated, Difficult Airway- due to anterior larynx and Difficult Airway- due to limited oral opening Future Recommendations: Recommend- induction with short-acting agent, and alternative techniques readily available Comments: Easy mask airway with oral airway in place. DL with MAC 3 by CRNA, view of epiglottis only. Attempted to place bougie, no resistance felt. ETT advanced over bougie and esophageal intubation immediately recognized. DL with MAC 3 by MD. View of epiglottis only. Placed bougie without resistance, ETT advanced over bougie. +EtCO2, +BBS. Very small laceration noted on upper lip. Teeth as preop. Ointment applied to lips.

## 2014-06-25 NOTE — Interval H&P Note (Signed)
History and Physical Interval Note:  06/25/2014 8:18 AM  Mindy Anderson [id number]  has presented today for surgery, with the diagnosis of SYMPTOMATIC GALLSTONES  The various methods of treatment have been discussed with the patient and family. After consideration of risks, benefits and other options for treatment, the patient has consented to  Procedure(s): LAPAROSCOPIC CHOLECYSTECTOMY (N/A) as a surgical intervention .  The patient's history has been reviewed, patient examined, no change in status, stable for surgery.  I have reviewed the patient's chart and labs.  Questions were answered to the patient's satisfaction.     Rosario Jacks., Anne Hahn

## 2014-06-26 ENCOUNTER — Telehealth: Payer: Self-pay

## 2014-06-26 DIAGNOSIS — G4730 Sleep apnea, unspecified: Secondary | ICD-10-CM

## 2014-06-26 NOTE — Telephone Encounter (Signed)
-----   Message from Rosalita Chessman, DO sent at 06/21/2014  2:50 PM EDT ----- Inc risk for sleep apnea --- see preop testing Once pt recovered from GB surgery--- will need pulm referral for sleep apnea

## 2014-06-30 ENCOUNTER — Encounter (HOSPITAL_COMMUNITY): Payer: Self-pay | Admitting: General Surgery

## 2014-07-09 NOTE — Telephone Encounter (Signed)
Patient has been made aware and verbalized understanding, she agreed to the ref and the ref has been placed.    KP

## 2014-07-09 NOTE — Telephone Encounter (Signed)
Msg left to call the office     KP 

## 2014-09-03 ENCOUNTER — Encounter: Payer: Self-pay | Admitting: Pulmonary Disease

## 2014-09-03 ENCOUNTER — Ambulatory Visit (INDEPENDENT_AMBULATORY_CARE_PROVIDER_SITE_OTHER): Payer: Commercial Managed Care - HMO | Admitting: Pulmonary Disease

## 2014-09-03 VITALS — BP 148/70 | HR 75 | Ht 65.0 in | Wt 189.8 lb

## 2014-09-03 DIAGNOSIS — G4733 Obstructive sleep apnea (adult) (pediatric): Secondary | ICD-10-CM | POA: Diagnosis not present

## 2014-09-03 NOTE — Patient Instructions (Signed)
Will arrange for sleep study Will call to arrange for follow up after sleep study reviewed 

## 2014-09-03 NOTE — Progress Notes (Deleted)
   Subjective:    Patient ID: Mindy Anderson, female    DOB: 03-18-1951, 64 y.o.   MRN: [id number]  HPI    Review of Systems  Constitutional: Negative for fever and unexpected weight change.  HENT: Negative for congestion, dental problem, ear pain, nosebleeds, postnasal drip, rhinorrhea, sinus pressure, sneezing, sore throat and trouble swallowing.   Eyes: Negative for redness and itching.  Respiratory: Negative for cough, chest tightness, shortness of breath and wheezing.   Cardiovascular: Negative for palpitations and leg swelling.  Gastrointestinal: Negative for nausea and vomiting.  Genitourinary: Negative for dysuria.  Musculoskeletal: Negative for joint swelling.  Skin: Negative for rash.  Neurological: Negative for headaches.  Hematological: Does not bruise/bleed easily.  Psychiatric/Behavioral: Negative for dysphoric mood. The patient is not nervous/anxious.        Objective:   Physical Exam        Assessment & Plan:

## 2014-09-03 NOTE — Progress Notes (Signed)
Chief Complaint  Patient presents with  . SLEEP STUDY    Referred by Dr Etter Sjogren. Epworth Score: 5    History of Present Illness: Mindy Anderson is a 64 y.o. female for evaluation of sleep problems.  Her husband has been concerned about her snoring for years.  He now sleeps in a separate bedroom.  She has been told she will stop breathing while asleep, and will wake up feeling like she can't breath.  This happens more on her back.  She had gallbladder surgery in March 2016, and was told that her breathing was bad when she was asleep and that she needed a sleep study.  She goes to sleep between 9 and 11 pm.  She falls asleep after a few minutes.  She wakes up 1 or 2 times to use the bathroom.  She gets out of bed at 6 am.  She feels okay in the morning.  She denies morning headache.  She does not use anything to help her fall sleep or stay awake.  She is a restless sleeper, and gets sweaty at night.  She will fall asleep easily when sitting quiet, and usually falls asleep watching TV before she goes to bed.  She denies sleep walking, sleep talking, bruxism, or nightmares.  There is no history of restless legs.  She denies sleep hallucinations, sleep paralysis, or cataplexy.  The Epworth score is 5 out of 24.  Tests:   [name]  has a past medical history of Hypertension; Complication of anesthesia; PONV (postoperative nausea and vomiting); Family history of adverse reaction to anesthesia; and Arthritis.  [name]  has past surgical history that includes Band Aid surgery [phone]); Tubal ligation; and Cholecystectomy (N/A, 06/25/2014).  Prior to Admission medications   Medication Sig Start Date End Date Taking? Authorizing Provider  aspirin 81 MG tablet Take 81 mg by mouth daily.     Yes Historical Provider, MD  calcium carbonate (OS-CAL) 600 MG TABS Take 600 mg by mouth 2 (two) times daily with a meal.     Yes Historical Provider, MD  Cholecalciferol (VITAMIN D) 2000 UNITS  tablet Take 2,000 Units by mouth daily.     Yes Historical Provider, MD  hydrochlorothiazide (HYDRODIURIL) 25 MG tablet Take 1 tablet (25 mg total) by mouth daily. 03/04/14 03/04/15 Yes Yvonne R Lowne, DO  ibuprofen (ADVIL,MOTRIN) 400 MG tablet Take 400 mg by mouth every 6 (six) hours as needed for mild pain.   Yes Historical Provider, MD  Omega-3 Fatty Acids (FISH OIL) 600 MG CAPS Take 1,200 mg by mouth daily.   Yes Historical Provider, MD  Potassium 95 MG TABS Take 1 tablet by mouth daily.     Yes Historical Provider, MD    Allergies  Allergen Reactions  . Hydrocodone Itching and Nausea And Vomiting  . Tramadol Nausea Only    Her family history includes Breast cancer in her mother, sister, and sister; COPD in her father; Cervical cancer in her mother; Colon cancer in her sister; Colon polyps in her daughter; Diabetes in her sister; Heart disease in her brother; Heart disease (age of onset: 56) in her father; Heart disease (age of onset: 9) in her mother; Liver cancer in her sister and sister; Lung cancer in her sister; Uterine cancer in her mother. There is no history of Rectal cancer, Stomach cancer, or Esophageal cancer.  She  reports that she has never smoked. She has never used smokeless tobacco. She reports that she does  not drink alcohol or use illicit drugs.  Review of Systems  Constitutional: Negative for fever and unexpected weight change.  HENT: Negative for congestion, dental problem, ear pain, nosebleeds, postnasal drip, rhinorrhea, sinus pressure, sneezing, sore throat and trouble swallowing.   Eyes: Negative for redness and itching.  Respiratory: Negative for cough, chest tightness, shortness of breath and wheezing.   Cardiovascular: Negative for palpitations and leg swelling.  Gastrointestinal: Negative for nausea and vomiting.  Genitourinary: Negative for dysuria.  Musculoskeletal: Negative for joint swelling.  Skin: Negative for rash.  Neurological: Negative for  headaches.  Hematological: Does not bruise/bleed easily.  Psychiatric/Behavioral: Negative for dysphoric mood. The patient is not nervous/anxious.    Physical Exam: Blood pressure 148/70, pulse 75, height 5\' 5"  (1.651 m), weight 189 lb 12.8 oz (86.093 kg), SpO2 99 %. Body mass index is 31.58 kg/(m^2).  General - No distress ENT - No sinus tenderness, no oral exudate, no LAN, no thyromegaly, TM clear, pupils equal/reactive, MP 4, micrognathic, enlarged tongue Cardiac - s1s2 regular, no murmur, pulses symmetric Chest - No wheeze/rales/dullness, good air entry, normal respiratory excursion Back - No focal tenderness Abd - Soft, non-tender, no organomegaly, + bowel sounds Ext - No edema Neuro - Normal strength, cranial nerves intact Skin - No rashes Psych - Normal mood, and behavior  Discussion: She has snoring, sleep disruption, witnessed apnea, and daytime sleepiness.  She has hx of hypertension and family history of sleep apnea.  I am concerned she could also have sleep apnea.  We discussed how sleep apnea can affect various health problems including risks for hypertension, cardiovascular disease, and diabetes.  We also discussed how sleep disruption can increase risks for accident, such as while driving.  Weight loss as a means of improving sleep apnea was also reviewed.  Additional treatment options discussed were CPAP therapy, oral appliance, and surgical intervention.   Assessment/plan:  Obstructive sleep apnea. Plan: - will arrange for in lab sleep study  Obesity. Plan: - discussed importance of weight loss  Hypertension with elevated blood pressure today. Plan: - she is to f/u with her PCP   Chesley Mires, M.D. Pager [phone]

## 2014-10-07 ENCOUNTER — Telehealth: Payer: Self-pay | Admitting: Family Medicine

## 2014-10-07 NOTE — Telephone Encounter (Signed)
pre visit letter mailed 09/30/14

## 2014-10-13 ENCOUNTER — Other Ambulatory Visit: Payer: Self-pay | Admitting: Family Medicine

## 2014-10-20 ENCOUNTER — Telehealth: Payer: Self-pay | Admitting: Behavioral Health

## 2014-10-20 ENCOUNTER — Encounter: Payer: Self-pay | Admitting: Behavioral Health

## 2014-10-20 NOTE — Telephone Encounter (Signed)
Pre-Visit Call completed with patient and chart updated.   Pre-Visit Info documented in Specialty Comments under [employer].    

## 2014-10-20 NOTE — Addendum Note (Signed)
Addended by: Eduard Roux E on: 10/20/2014 12:34 PM   Modules accepted: Medications

## 2014-10-20 NOTE — Telephone Encounter (Signed)
Unable to reach patient at time of Pre-Visit Call.  Left message for patient to return call when available.    

## 2014-10-21 ENCOUNTER — Encounter: Payer: Self-pay | Admitting: Family Medicine

## 2014-10-21 ENCOUNTER — Ambulatory Visit (INDEPENDENT_AMBULATORY_CARE_PROVIDER_SITE_OTHER): Payer: Commercial Managed Care - HMO | Admitting: Family Medicine

## 2014-10-21 VITALS — BP 140/82 | HR 69 | Temp 98.0°F | Ht 65.0 in | Wt 186.4 lb

## 2014-10-21 DIAGNOSIS — Z0000 Encounter for general adult medical examination without abnormal findings: Secondary | ICD-10-CM

## 2014-10-21 DIAGNOSIS — I10 Essential (primary) hypertension: Secondary | ICD-10-CM

## 2014-10-21 DIAGNOSIS — Z23 Encounter for immunization: Secondary | ICD-10-CM | POA: Diagnosis not present

## 2014-10-21 LAB — POCT URINALYSIS DIPSTICK
BILIRUBIN UA: NEGATIVE
Blood, UA: NEGATIVE
GLUCOSE UA: NEGATIVE
KETONES UA: NEGATIVE
Leukocytes, UA: NEGATIVE
Nitrite, UA: NEGATIVE
Protein, UA: NEGATIVE
Spec Grav, UA: 1.02
Urobilinogen, UA: 0.2
pH, UA: 6

## 2014-10-21 LAB — HEPATIC FUNCTION PANEL
ALBUMIN: 3.7 g/dL (ref 3.5–5.2)
ALT: 15 U/L (ref 0–35)
AST: 19 U/L (ref 0–37)
Alkaline Phosphatase: 65 U/L (ref 39–117)
Bilirubin, Direct: 0.1 mg/dL (ref 0.0–0.3)
Total Bilirubin: 0.4 mg/dL (ref 0.2–1.2)
Total Protein: 6.9 g/dL (ref 6.0–8.3)

## 2014-10-21 LAB — CBC WITH DIFFERENTIAL/PLATELET
BASOS PCT: 0.5 % (ref 0.0–3.0)
Basophils Absolute: 0 10*3/uL (ref 0.0–0.1)
EOS ABS: 0.2 10*3/uL (ref 0.0–0.7)
Eosinophils Relative: 3.1 % (ref 0.0–5.0)
HEMATOCRIT: 40.2 % (ref 36.0–46.0)
Hemoglobin: 13.6 g/dL (ref 12.0–15.0)
LYMPHS ABS: 1.9 10*3/uL (ref 0.7–4.0)
Lymphocytes Relative: 30.1 % (ref 12.0–46.0)
MCHC: 33.9 g/dL (ref 30.0–36.0)
MCV: 89.9 fl (ref 78.0–100.0)
MONOS PCT: 7.9 % (ref 3.0–12.0)
Monocytes Absolute: 0.5 10*3/uL (ref 0.1–1.0)
Neutro Abs: 3.7 10*3/uL (ref 1.4–7.7)
Neutrophils Relative %: 58.4 % (ref 43.0–77.0)
Platelets: 189 10*3/uL (ref 150.0–400.0)
RBC: 4.47 Mil/uL (ref 3.87–5.11)
RDW: 13 % (ref 11.5–15.5)
WBC: 6.3 10*3/uL (ref 4.0–10.5)

## 2014-10-21 LAB — BASIC METABOLIC PANEL
BUN: 12 mg/dL (ref 6–23)
CHLORIDE: 103 meq/L (ref 96–112)
CO2: 32 mEq/L (ref 19–32)
Calcium: 9.1 mg/dL (ref 8.4–10.5)
Creatinine, Ser: 0.62 mg/dL (ref 0.40–1.20)
GFR: 102.35 mL/min (ref >60.00)
Glucose, Bld: 107 mg/dL — ABNORMAL HIGH (ref 70–99)
Potassium: 3.7 mEq/L (ref 3.5–5.1)
Sodium: 140 mEq/L (ref 135–145)

## 2014-10-21 LAB — LIPID PANEL
Cholesterol: 165 mg/dL (ref 0–200)
HDL: 52 mg/dL (ref >39.00)
LDL CALC: 99 mg/dL (ref 0–99)
NONHDL: 113
TRIGLYCERIDES: 70 mg/dL (ref 0.0–149.0)
Total CHOL/HDL Ratio: 3
VLDL: 14 mg/dL (ref 0.0–40.0)

## 2014-10-21 NOTE — Progress Notes (Signed)
Subjective:   Mindy Anderson is a 64 y.o. female who presents for Medicare Annual (Subsequent) preventive examination.  No complaints.    Review of Systems:   Review of Systems  Constitutional: Negative for activity change, appetite change and fatigue.  HENT: Negative for hearing loss, congestion, tinnitus and ear discharge.   Eyes: Negative for visual disturbance (see optho q1y -- vision corrected to 20/20 with glasses).  Respiratory: Negative for cough, chest tightness and shortness of breath.   Cardiovascular: Negative for chest pain, palpitations and leg swelling.  Gastrointestinal: Negative for abdominal pain, diarrhea, constipation and abdominal distention.  Genitourinary: Negative for urgency, frequency, decreased urine volume and difficulty urinating.  Musculoskeletal: Negative for back pain, arthralgias and gait problem.  Skin: Negative for color change, pallor and rash.  Neurological: Negative for dizziness, light-headedness, numbness and headaches.  Hematological: Negative for adenopathy. Does not bruise/bleed easily.  Psychiatric/Behavioral: Negative for suicidal ideas, confusion, sleep disturbance, self-injury, dysphoric mood, decreased concentration and agitation.  Pt is able to read and write and can do all ADLs No risk for falling No abuse/ violence in home          Objective:     Vitals: BP 140/82 mmHg  Pulse 69  Temp(Src) 98 F (36.7 C) (Oral)  Ht 5\' 5"  (1.651 m)  Wt 186 lb 6.4 oz (84.55 kg)  BMI 31.02 kg/m2  SpO2 98% BP 140/82 mmHg  Pulse 69  Temp(Src) 98 F (36.7 C) (Oral)  Ht 5\' 5"  (1.651 m)  Wt 186 lb 6.4 oz (84.55 kg)  BMI 31.02 kg/m2  SpO2 98%  General Appearance:    Alert, cooperative, no distress, appears stated age  Head:    Normocephalic, without obvious abnormality, atraumatic  Eyes:    PERRL, conjunctiva/corneas clear, EOM's intact, fundi    benign, both eyes  Ears:    Normal TM's and external ear canals, both ears  Nose:   Nares  normal, septum midline, mucosa normal, no drainage    or sinus tenderness  Throat:   Lips, mucosa, and tongue normal; teeth and gums normal  Neck:   Supple, symmetrical, trachea midline, no adenopathy;    thyroid:  no enlargement/tenderness/nodules; no carotid   bruit or JVD  Back:     Symmetric, no curvature, ROM normal, no CVA tenderness  Lungs:     Clear to auscultation bilaterally, respirations unlabored  Chest Wall:    No tenderness or deformity   Heart:    Regular rate and rhythm, S1 and S2 normal, no murmur, rub   or gallop  Breast Exam:    No tenderness, masses, or nipple abnormality  Abdomen:     Soft, non-tender, bowel sounds active all four quadrants,    no masses, no organomegaly  Genitalia:    Normal female without lesion, discharge or tenderness  Rectal:    Normal tone, normal prostate, no masses or tenderness;   guaiac negative stool  Extremities:   Extremities normal, atraumatic, no cyanosis or edema  Pulses:   2+ and symmetric all extremities  Skin:   Skin color, texture, turgor normal, no rashes or lesions  Lymph nodes:   Cervical, supraclavicular, and axillary nodes normal  Neurologic:   CNII-XII intact, normal strength, sensation and reflexes    throughout  BP 140/82 mmHg  Pulse 69  Temp(Src) 98 F (36.7 C) (Oral)  Ht 5\' 5"  (1.651 m)  Wt 186 lb 6.4 oz (84.55 kg)  BMI 31.02 kg/m2  SpO2 98% General  appearance: alert, cooperative, appears stated age and no distress Head: Normocephalic, without obvious abnormality, atraumatic Eyes: conjunctivae/corneas clear. PERRL, EOM's intact. Fundi benign. Ears: normal TM's and external ear canals both ears Nose: Nares normal. Septum midline. Mucosa normal. No drainage or sinus tenderness. Throat: lips, mucosa, and tongue normal; teeth and gums normal Neck: no adenopathy, no carotid bruit, no JVD, supple, symmetrical, trachea midline and thyroid not enlarged, symmetric, no tenderness/mass/nodules Back: symmetric, no  curvature. ROM normal. No CVA tenderness. Lungs: clear to auscultation bilaterally Breasts: normal appearance, no masses or tenderness Heart: regular rate and rhythm, S1, S2 normal, no murmur, click, rub or gallop Abdomen: soft, non-tender; bowel sounds normal; no masses,  no organomegaly Pelvic: not indicated; post-menopausal, no abnormal Pap smears in past Extremities: extremities normal, atraumatic, no cyanosis or edema Pulses: 2+ and symmetric Skin: Skin color, texture, turgor normal. No rashes or lesions Lymph nodes: Cervical, supraclavicular, and axillary nodes normal. Neurologic: Alert and oriented X 3, normal strength and tone. Normal symmetric reflexes. Normal coordination and gait PSYCH-- NO Depression, no anxiety Tobacco History  Smoking status  . Never Smoker   Smokeless tobacco  . Never Used     Counseling given: Not Answered   Past Medical History  Diagnosis Date  . Hypertension   . Complication of anesthesia   . PONV (postoperative nausea and vomiting)   . Family history of adverse reaction to anesthesia     mother of pt. wakes slowly  . Arthritis     hands, knees    Past Surgical History  Procedure Laterality Date  . Band aid surgery  1980's  . Tubal ligation    . Cholecystectomy N/A 06/25/2014    Procedure: LAPAROSCOPIC CHOLECYSTECTOMY;  Surgeon: Ralene Ok, MD;  Location: [facility] OR;  Service: General;  Laterality: N/A;   Family History  Problem Relation Age of Onset  . Uterine cancer Mother   . Heart disease Mother 73    chf  . Breast cancer Mother   . Cervical cancer Mother   . Breast cancer Sister   . Diabetes Sister   . COPD Father   . Heart disease Father 98    MI  . Heart disease Brother     cabg with stents  . Cancer Brother     Skin cancer  . Colon cancer Sister   . Lung cancer Sister   . Liver cancer Sister   . Breast cancer Sister   . Liver cancer Sister   . Rectal cancer Neg Hx   . Stomach cancer Neg Hx   . Esophageal cancer  Neg Hx   . Colon polyps Daughter    History  Sexual Activity  . Sexual Activity:  . Partners: Male    Outpatient Encounter Prescriptions as of 10/21/2014  Medication Sig  . aspirin 81 MG tablet Take 81 mg by mouth daily.    . calcium carbonate (OS-CAL) 600 MG TABS Take 600 mg by mouth 2 (two) times daily with a meal.    . Cholecalciferol (VITAMIN D) 2000 UNITS tablet Take 2,000 Units by mouth daily.    . hydrochlorothiazide (HYDRODIURIL) 25 MG tablet TAKE 1 TABLET EVERY DAY  (SUBSTITUTED  FOR  HYDRODIURIL)  . ibuprofen (ADVIL,MOTRIN) 400 MG tablet Take 400 mg by mouth every 6 (six) hours as needed for mild pain.  . Omega-3 Fatty Acids (FISH OIL) 600 MG CAPS Take 1,200 mg by mouth daily.  . Potassium 95 MG TABS Take 1 tablet by mouth daily.  No facility-administered encounter medications on file as of 10/21/2014.    Activities of Daily Living In your present state of health, do you have any difficulty performing the following activities: 06/19/2014  Hearing? N  Vision? N  Difficulty concentrating or making decisions? N  Walking or climbing stairs? N  Dressing or bathing? N    Patient Care Team: Rosalita Chessman, DO as PCP - General    Assessment:    cpe Exercise Activities and Dietary recommendations-- nothing organized--- some walking, yard work    Goals    None     Fall Risk Fall Risk  10/21/2014 10/17/2013  Falls in the past year? No No   Depression Screen PHQ 2/9 Scores 10/21/2014 10/17/2013 04/02/2012  PHQ - 2 Score 0 0 0     Cognitive Testing No flowsheet data found.  Immunization History  Administered Date(s) Administered  . Influenza Split 02/09/2011  . Influenza Whole 01/09/2008, 03/11/2009, 03/09/2010, 02/03/2012  . Influenza,inj,Quad PF,36+ Mos 01/15/2014  . Pneumococcal Conjugate-13 10/21/2014  . Pneumococcal Polysaccharide-23 10/17/2013  . Td 02/11/2008  . Zoster 05/18/2009   Screening Tests Health Maintenance  Topic Date Due  . INFLUENZA  VACCINE  11/03/2014  . MAMMOGRAM  10/30/2015  . TETANUS/TDAP  02/10/2018  . COLONOSCOPY  12/27/2023  . DEXA SCAN  Completed  . ZOSTAVAX  Completed  . PNA vac Low Risk Adult  Completed      Plan:    ghm Check labs During the course of the visit the patient was educated and counseled about the following appropriate screening and preventive services:   Vaccines to include Pneumoccal, Influenza, Hepatitis B, Td, Zostavax, HCV  Electrocardiogram  Cardiovascular Disease  Colorectal cancer screening  Bone density screening  Diabetes screening  Glaucoma screening  Mammography/PAP  Nutrition counseling   Patient Instructions (the written plan) was given to the patient.  1. Need for pneumococcal vaccination   - Pneumococcal conjugate vaccine 13-valent  2. Essential hypertension Stable, on hctz - Basic metabolic panel - CBC with Differential/Platelet - Hepatic function panel - Lipid panel - POCT urinalysis dipstick  3. Medicare annual wellness visit, subsequent See AVS  4. Routine history and physical examination of adult    Garnet Koyanagi, DO  10/21/2014

## 2014-10-21 NOTE — Progress Notes (Signed)
Pre visit review using our clinic review tool, if applicable. No additional management support is needed unless otherwise documented below in the visit note. 

## 2014-10-21 NOTE — Patient Instructions (Signed)
Preventive Care for Adults A healthy lifestyle and preventive care can promote health and wellness. Preventive health guidelines for women include the following key practices.  A routine yearly physical is a good way to check with your health care provider about your health and preventive screening. It is a chance to share any concerns and updates on your health and to receive a thorough exam.  Visit your dentist for a routine exam and preventive care every 6 months. Brush your teeth twice a day and floss once a day. Good oral hygiene prevents tooth decay and gum disease.  The frequency of eye exams is based on your age, health, family medical history, use of contact lenses, and other factors. Follow your health care provider's recommendations for frequency of eye exams.  Eat a healthy diet. Foods like vegetables, fruits, whole grains, low-fat dairy products, and lean protein foods contain the nutrients you need without too many calories. Decrease your intake of foods high in solid fats, added sugars, and salt. Eat the right amount of calories for you.Get information about a proper diet from your health care provider, if necessary.  Regular physical exercise is one of the most important things you can do for your health. Most adults should get at least 150 minutes of moderate-intensity exercise (any activity that increases your heart rate and causes you to sweat) each week. In addition, most adults need muscle-strengthening exercises on 2 or more days a week.  Maintain a healthy weight. The body mass index (BMI) is a screening tool to identify possible weight problems. It provides an estimate of body fat based on height and weight. Your health care provider can find your BMI and can help you achieve or maintain a healthy weight.For adults 20 years and older:  A BMI below 18.5 is considered underweight.  A BMI of 18.5 to 24.9 is normal.  A BMI of 25 to 29.9 is considered overweight.  A BMI of  30 and above is considered obese.  Maintain normal blood lipids and cholesterol levels by exercising and minimizing your intake of saturated fat. Eat a balanced diet with plenty of fruit and vegetables. Blood tests for lipids and cholesterol should begin at age 76 and be repeated every 5 years. If your lipid or cholesterol levels are high, you are over 50, or you are at high risk for heart disease, you may need your cholesterol levels checked more frequently.Ongoing high lipid and cholesterol levels should be treated with medicines if diet and exercise are not working.  If you smoke, find out from your health care provider how to quit. If you do not use tobacco, do not start.  Lung cancer screening is recommended for adults aged 22-80 years who are at high risk for developing lung cancer because of a history of smoking. A yearly low-dose CT scan of the lungs is recommended for people who have at least a 30-pack-year history of smoking and are a current smoker or have quit within the past 15 years. A pack year of smoking is smoking an average of 1 pack of cigarettes a day for 1 year (for example: 1 pack a day for 30 years or 2 packs a day for 15 years). Yearly screening should continue until the smoker has stopped smoking for at least 15 years. Yearly screening should be stopped for people who develop a health problem that would prevent them from having lung cancer treatment.  If you are pregnant, do not drink alcohol. If you are breastfeeding,  be very cautious about drinking alcohol. If you are not pregnant and choose to drink alcohol, do not have more than 1 drink per day. One drink is considered to be 12 ounces (355 mL) of beer, 5 ounces (148 mL) of wine, or 1.5 ounces (44 mL) of liquor.  Avoid use of street drugs. Do not share needles with anyone. Ask for help if you need support or instructions about stopping the use of drugs.  High blood pressure causes heart disease and increases the risk of  stroke. Your blood pressure should be checked at least every 1 to 2 years. Ongoing high blood pressure should be treated with medicines if weight loss and exercise do not work.  If you are 75-52 years old, ask your health care provider if you should take aspirin to prevent strokes.  Diabetes screening involves taking a blood sample to check your fasting blood sugar level. This should be done once every 3 years, after age 15, if you are within normal weight and without risk factors for diabetes. Testing should be considered at a younger age or be carried out more frequently if you are overweight and have at least 1 risk factor for diabetes.  Breast cancer screening is essential preventive care for women. You should practice "breast self-awareness." This means understanding the normal appearance and feel of your breasts and may include breast self-examination. Any changes detected, no matter how small, should be reported to a health care provider. Women in their 58s and 30s should have a clinical breast exam (CBE) by a health care provider as part of a regular health exam every 1 to 3 years. After age 16, women should have a CBE every year. Starting at age 53, women should consider having a mammogram (breast X-ray test) every year. Women who have a family history of breast cancer should talk to their health care provider about genetic screening. Women at a high risk of breast cancer should talk to their health care providers about having an MRI and a mammogram every year.  Breast cancer gene (BRCA)-related cancer risk assessment is recommended for women who have family members with BRCA-related cancers. BRCA-related cancers include breast, ovarian, tubal, and peritoneal cancers. Having family members with these cancers may be associated with an increased risk for harmful changes (mutations) in the breast cancer genes BRCA1 and BRCA2. Results of the assessment will determine the need for genetic counseling and  BRCA1 and BRCA2 testing.  Routine pelvic exams to screen for cancer are no longer recommended for nonpregnant women who are considered low risk for cancer of the pelvic organs (ovaries, uterus, and vagina) and who do not have symptoms. Ask your health care provider if a screening pelvic exam is right for you.  If you have had past treatment for cervical cancer or a condition that could lead to cancer, you need Pap tests and screening for cancer for at least 20 years after your treatment. If Pap tests have been discontinued, your risk factors (such as having a new sexual partner) need to be reassessed to determine if screening should be resumed. Some women have medical problems that increase the chance of getting cervical cancer. In these cases, your health care provider may recommend more frequent screening and Pap tests.  The HPV test is an additional test that may be used for cervical cancer screening. The HPV test looks for the virus that can cause the cell changes on the cervix. The cells collected during the Pap test can be  tested for HPV. The HPV test could be used to screen women aged 30 years and older, and should be used in women of any age who have unclear Pap test results. After the age of 30, women should have HPV testing at the same frequency as a Pap test.  Colorectal cancer can be detected and often prevented. Most routine colorectal cancer screening begins at the age of 50 years and continues through age 75 years. However, your health care provider may recommend screening at an earlier age if you have risk factors for colon cancer. On a yearly basis, your health care provider may provide home test kits to check for hidden blood in the stool. Use of a small camera at the end of a tube, to directly examine the colon (sigmoidoscopy or colonoscopy), can detect the earliest forms of colorectal cancer. Talk to your health care provider about this at age 50, when routine screening begins. Direct  exam of the colon should be repeated every 5-10 years through age 75 years, unless early forms of pre-cancerous polyps or small growths are found.  People who are at an increased risk for hepatitis B should be screened for this virus. You are considered at high risk for hepatitis B if:  You were born in a country where hepatitis B occurs often. Talk with your health care provider about which countries are considered high risk.  Your parents were born in a high-risk country and you have not received a shot to protect against hepatitis B (hepatitis B vaccine).  You have HIV or AIDS.  You use needles to inject street drugs.  You live with, or have sex with, someone who has hepatitis B.  You get hemodialysis treatment.  You take certain medicines for conditions like cancer, organ transplantation, and autoimmune conditions.  Hepatitis C blood testing is recommended for all people born from 1945 through 1965 and any individual with known risks for hepatitis C.  Practice safe sex. Use condoms and avoid high-risk sexual practices to reduce the spread of sexually transmitted infections (STIs). STIs include gonorrhea, chlamydia, syphilis, trichomonas, herpes, HPV, and human immunodeficiency virus (HIV). Herpes, HIV, and HPV are viral illnesses that have no cure. They can result in disability, cancer, and death.  You should be screened for sexually transmitted illnesses (STIs) including gonorrhea and chlamydia if:  You are sexually active and are younger than 24 years.  You are older than 24 years and your health care provider tells you that you are at risk for this type of infection.  Your sexual activity has changed since you were last screened and you are at an increased risk for chlamydia or gonorrhea. Ask your health care provider if you are at risk.  If you are at risk of being infected with HIV, it is recommended that you take a prescription medicine daily to prevent HIV infection. This is  called preexposure prophylaxis (PrEP). You are considered at risk if:  You are a heterosexual woman, are sexually active, and are at increased risk for HIV infection.  You take drugs by injection.  You are sexually active with a partner who has HIV.  Talk with your health care provider about whether you are at high risk of being infected with HIV. If you choose to begin PrEP, you should first be tested for HIV. You should then be tested every 3 months for as long as you are taking PrEP.  Osteoporosis is a disease in which the bones lose minerals and strength   with aging. This can result in serious bone fractures or breaks. The risk of osteoporosis can be identified using a bone density scan. Women ages 65 years and over and women at risk for fractures or osteoporosis should discuss screening with their health care providers. Ask your health care provider whether you should take a calcium supplement or vitamin D to reduce the rate of osteoporosis.  Menopause can be associated with physical symptoms and risks. Hormone replacement therapy is available to decrease symptoms and risks. You should talk to your health care provider about whether hormone replacement therapy is right for you.  Use sunscreen. Apply sunscreen liberally and repeatedly throughout the day. You should seek shade when your shadow is shorter than you. Protect yourself by wearing long sleeves, pants, a wide-brimmed hat, and sunglasses year round, whenever you are outdoors.  Once a month, do a whole body skin exam, using a mirror to look at the skin on your back. Tell your health care provider of new moles, moles that have irregular borders, moles that are larger than a pencil eraser, or moles that have changed in shape or color.  Stay current with required vaccines (immunizations).  Influenza vaccine. All adults should be immunized every year.  Tetanus, diphtheria, and acellular pertussis (Td, Tdap) vaccine. Pregnant women should  receive 1 dose of Tdap vaccine during each pregnancy. The dose should be obtained regardless of the length of time since the last dose. Immunization is preferred during the 27th-36th week of gestation. An adult who has not previously received Tdap or who does not know her vaccine status should receive 1 dose of Tdap. This initial dose should be followed by tetanus and diphtheria toxoids (Td) booster doses every 10 years. Adults with an unknown or incomplete history of completing a 3-dose immunization series with Td-containing vaccines should begin or complete a primary immunization series including a Tdap dose. Adults should receive a Td booster every 10 years.  Varicella vaccine. An adult without evidence of immunity to varicella should receive 2 doses or a second dose if she has previously received 1 dose. Pregnant females who do not have evidence of immunity should receive the first dose after pregnancy. This first dose should be obtained before leaving the health care facility. The second dose should be obtained 4-8 weeks after the first dose.  Human papillomavirus (HPV) vaccine. Females aged 13-26 years who have not received the vaccine previously should obtain the 3-dose series. The vaccine is not recommended for use in pregnant females. However, pregnancy testing is not needed before receiving a dose. If a female is found to be pregnant after receiving a dose, no treatment is needed. In that case, the remaining doses should be delayed until after the pregnancy. Immunization is recommended for any person with an immunocompromised condition through the age of 26 years if she did not get any or all doses earlier. During the 3-dose series, the second dose should be obtained 4-8 weeks after the first dose. The third dose should be obtained 24 weeks after the first dose and 16 weeks after the second dose.  Zoster vaccine. One dose is recommended for adults aged 60 years or older unless certain conditions are  present.  Measles, mumps, and rubella (MMR) vaccine. Adults born before 1957 generally are considered immune to measles and mumps. Adults born in 1957 or later should have 1 or more doses of MMR vaccine unless there is a contraindication to the vaccine or there is laboratory evidence of immunity to   each of the three diseases. A routine second dose of MMR vaccine should be obtained at least 28 days after the first dose for students attending postsecondary schools, health care workers, or international travelers. People who received inactivated measles vaccine or an unknown type of measles vaccine during 1963-1967 should receive 2 doses of MMR vaccine. People who received inactivated mumps vaccine or an unknown type of mumps vaccine before 1979 and are at high risk for mumps infection should consider immunization with 2 doses of MMR vaccine. For females of childbearing age, rubella immunity should be determined. If there is no evidence of immunity, females who are not pregnant should be vaccinated. If there is no evidence of immunity, females who are pregnant should delay immunization until after pregnancy. Unvaccinated health care workers born before 1957 who lack laboratory evidence of measles, mumps, or rubella immunity or laboratory confirmation of disease should consider measles and mumps immunization with 2 doses of MMR vaccine or rubella immunization with 1 dose of MMR vaccine.  Pneumococcal 13-valent conjugate (PCV13) vaccine. When indicated, a person who is uncertain of her immunization history and has no record of immunization should receive the PCV13 vaccine. An adult aged 19 years or older who has certain medical conditions and has not been previously immunized should receive 1 dose of PCV13 vaccine. This PCV13 should be followed with a dose of pneumococcal polysaccharide (PPSV23) vaccine. The PPSV23 vaccine dose should be obtained at least 8 weeks after the dose of PCV13 vaccine. An adult aged 19  years or older who has certain medical conditions and previously received 1 or more doses of PPSV23 vaccine should receive 1 dose of PCV13. The PCV13 vaccine dose should be obtained 1 or more years after the last PPSV23 vaccine dose.  Pneumococcal polysaccharide (PPSV23) vaccine. When PCV13 is also indicated, PCV13 should be obtained first. All adults aged 65 years and older should be immunized. An adult younger than age 65 years who has certain medical conditions should be immunized. Any person who resides in a nursing home or long-term care facility should be immunized. An adult smoker should be immunized. People with an immunocompromised condition and certain other conditions should receive both PCV13 and PPSV23 vaccines. People with human immunodeficiency virus (HIV) infection should be immunized as soon as possible after diagnosis. Immunization during chemotherapy or radiation therapy should be avoided. Routine use of PPSV23 vaccine is not recommended for American Indians, [place] Natives, or people younger than 65 years unless there are medical conditions that require PPSV23 vaccine. When indicated, people who have unknown immunization and have no record of immunization should receive PPSV23 vaccine. One-time revaccination 5 years after the first dose of PPSV23 is recommended for people aged 19-64 years who have chronic kidney failure, nephrotic syndrome, asplenia, or immunocompromised conditions. People who received 1-2 doses of PPSV23 before age 65 years should receive another dose of PPSV23 vaccine at age 65 years or later if at least 5 years have passed since the previous dose. Doses of PPSV23 are not needed for people immunized with PPSV23 at or after age 65 years.  Meningococcal vaccine. Adults with asplenia or persistent complement component deficiencies should receive 2 doses of quadrivalent meningococcal conjugate (MenACWY-D) vaccine. The doses should be obtained at least 2 months apart.  Microbiologists working with certain meningococcal bacteria, military recruits, people at risk during an outbreak, and people who travel to or live in countries with a high rate of meningitis should be immunized. A first-year college student up through age   21 years who is living in a residence hall should receive a dose if she did not receive a dose on or after her 16th birthday. Adults who have certain high-risk conditions should receive one or more doses of vaccine.  Hepatitis A vaccine. Adults who wish to be protected from this disease, have certain high-risk conditions, work with hepatitis A-infected animals, work in hepatitis A research labs, or travel to or work in countries with a high rate of hepatitis A should be immunized. Adults who were previously unvaccinated and who anticipate close contact with an international adoptee during the first 60 days after arrival in the [place] from a country with a high rate of hepatitis A should be immunized.  Hepatitis B vaccine. Adults who wish to be protected from this disease, have certain high-risk conditions, may be exposed to blood or other infectious body fluids, are household contacts or sex partners of hepatitis B positive people, are clients or workers in certain care facilities, or travel to or work in countries with a high rate of hepatitis B should be immunized.  Haemophilus influenzae type b (Hib) vaccine. A previously unvaccinated person with asplenia or sickle cell disease or having a scheduled splenectomy should receive 1 dose of Hib vaccine. Regardless of previous immunization, a recipient of a hematopoietic stem cell transplant should receive a 3-dose series 6-12 months after her successful transplant. Hib vaccine is not recommended for adults with HIV infection. Preventive Services / Frequency Ages 64 to 68 years  Blood pressure check.** / Every 1 to 2 years.  Lipid and cholesterol check.** / Every 5 years beginning at age  22.  Clinical breast exam.** / Every 3 years for women in their 88s and 53s.  BRCA-related cancer risk assessment.** / For women who have family members with a BRCA-related cancer (breast, ovarian, tubal, or peritoneal cancers).  Pap test.** / Every 2 years from ages 90 through 51. Every 3 years starting at age 21 through age 56 or 3 with a history of 3 consecutive normal Pap tests.  HPV screening.** / Every 3 years from ages 24 through ages 1 to 46 with a history of 3 consecutive normal Pap tests.  Hepatitis C blood test.** / For any individual with known risks for hepatitis C.  Skin self-exam. / Monthly.  Influenza vaccine. / Every year.  Tetanus, diphtheria, and acellular pertussis (Tdap, Td) vaccine.** / Consult your health care provider. Pregnant women should receive 1 dose of Tdap vaccine during each pregnancy. 1 dose of Td every 10 years.  Varicella vaccine.** / Consult your health care provider. Pregnant females who do not have evidence of immunity should receive the first dose after pregnancy.  HPV vaccine. / 3 doses over 6 months, if 72 and younger. The vaccine is not recommended for use in pregnant females. However, pregnancy testing is not needed before receiving a dose.  Measles, mumps, rubella (MMR) vaccine.** / You need at least 1 dose of MMR if you were born in 1957 or later. You may also need a 2nd dose. For females of childbearing age, rubella immunity should be determined. If there is no evidence of immunity, females who are not pregnant should be vaccinated. If there is no evidence of immunity, females who are pregnant should delay immunization until after pregnancy.  Pneumococcal 13-valent conjugate (PCV13) vaccine.** / Consult your health care provider.  Pneumococcal polysaccharide (PPSV23) vaccine.** / 1 to 2 doses if you smoke cigarettes or if you have certain conditions.  Meningococcal vaccine.** /  1 dose if you are age 19 to 21 years and a first-year college  student living in a residence hall, or have one of several medical conditions, you need to get vaccinated against meningococcal disease. You may also need additional booster doses.  Hepatitis A vaccine.** / Consult your health care provider.  Hepatitis B vaccine.** / Consult your health care provider.  Haemophilus influenzae type b (Hib) vaccine.** / Consult your health care provider. Ages 40 to 64 years  Blood pressure check.** / Every 1 to 2 years.  Lipid and cholesterol check.** / Every 5 years beginning at age 20 years.  Lung cancer screening. / Every year if you are aged 55-80 years and have a 30-pack-year history of smoking and currently smoke or have quit within the past 15 years. Yearly screening is stopped once you have quit smoking for at least 15 years or develop a health problem that would prevent you from having lung cancer treatment.  Clinical breast exam.** / Every year after age 40 years.  BRCA-related cancer risk assessment.** / For women who have family members with a BRCA-related cancer (breast, ovarian, tubal, or peritoneal cancers).  Mammogram.** / Every year beginning at age 40 years and continuing for as long as you are in good health. Consult with your health care provider.  Pap test.** / Every 3 years starting at age 30 years through age 65 or 70 years with a history of 3 consecutive normal Pap tests.  HPV screening.** / Every 3 years from ages 30 years through ages 65 to 70 years with a history of 3 consecutive normal Pap tests.  Fecal occult blood test (FOBT) of stool. / Every year beginning at age 50 years and continuing until age 75 years. You may not need to do this test if you get a colonoscopy every 10 years.  Flexible sigmoidoscopy or colonoscopy.** / Every 5 years for a flexible sigmoidoscopy or every 10 years for a colonoscopy beginning at age 50 years and continuing until age 75 years.  Hepatitis C blood test.** / For all people born from 1945 through  1965 and any individual with known risks for hepatitis C.  Skin self-exam. / Monthly.  Influenza vaccine. / Every year.  Tetanus, diphtheria, and acellular pertussis (Tdap/Td) vaccine.** / Consult your health care provider. Pregnant women should receive 1 dose of Tdap vaccine during each pregnancy. 1 dose of Td every 10 years.  Varicella vaccine.** / Consult your health care provider. Pregnant females who do not have evidence of immunity should receive the first dose after pregnancy.  Zoster vaccine.** / 1 dose for adults aged 60 years or older.  Measles, mumps, rubella (MMR) vaccine.** / You need at least 1 dose of MMR if you were born in 1957 or later. You may also need a 2nd dose. For females of childbearing age, rubella immunity should be determined. If there is no evidence of immunity, females who are not pregnant should be vaccinated. If there is no evidence of immunity, females who are pregnant should delay immunization until after pregnancy.  Pneumococcal 13-valent conjugate (PCV13) vaccine.** / Consult your health care provider.  Pneumococcal polysaccharide (PPSV23) vaccine.** / 1 to 2 doses if you smoke cigarettes or if you have certain conditions.  Meningococcal vaccine.** / Consult your health care provider.  Hepatitis A vaccine.** / Consult your health care provider.  Hepatitis B vaccine.** / Consult your health care provider.  Haemophilus influenzae type b (Hib) vaccine.** / Consult your health care provider. Ages 65   years and over  Blood pressure check.** / Every 1 to 2 years.  Lipid and cholesterol check.** / Every 5 years beginning at age 22 years.  Lung cancer screening. / Every year if you are aged 73-80 years and have a 30-pack-year history of smoking and currently smoke or have quit within the past 15 years. Yearly screening is stopped once you have quit smoking for at least 15 years or develop a health problem that would prevent you from having lung cancer  treatment.  Clinical breast exam.** / Every year after age 4 years.  BRCA-related cancer risk assessment.** / For women who have family members with a BRCA-related cancer (breast, ovarian, tubal, or peritoneal cancers).  Mammogram.** / Every year beginning at age 40 years and continuing for as long as you are in good health. Consult with your health care provider.  Pap test.** / Every 3 years starting at age 9 years through age 34 or 91 years with 3 consecutive normal Pap tests. Testing can be stopped between 65 and 70 years with 3 consecutive normal Pap tests and no abnormal Pap or HPV tests in the past 10 years.  HPV screening.** / Every 3 years from ages 57 years through ages 64 or 45 years with a history of 3 consecutive normal Pap tests. Testing can be stopped between 65 and 70 years with 3 consecutive normal Pap tests and no abnormal Pap or HPV tests in the past 10 years.  Fecal occult blood test (FOBT) of stool. / Every year beginning at age 15 years and continuing until age 17 years. You may not need to do this test if you get a colonoscopy every 10 years.  Flexible sigmoidoscopy or colonoscopy.** / Every 5 years for a flexible sigmoidoscopy or every 10 years for a colonoscopy beginning at age 86 years and continuing until age 71 years.  Hepatitis C blood test.** / For all people born from [birth year] through 1967 and any individual with known risks for hepatitis C.  Osteoporosis screening.** / A one-time screening for women ages 83 years and over and women at risk for fractures or osteoporosis.  Skin self-exam. / Monthly.  Influenza vaccine. / Every year.  Tetanus, diphtheria, and acellular pertussis (Tdap/Td) vaccine.** / 1 dose of Td every 10 years.  Varicella vaccine.** / Consult your health care provider.  Zoster vaccine.** / 1 dose for adults aged 61 years or older.  Pneumococcal 13-valent conjugate (PCV13) vaccine.** / Consult your health care provider.  Pneumococcal  polysaccharide (PPSV23) vaccine.** / 1 dose for all adults aged 28 years and older.  Meningococcal vaccine.** / Consult your health care provider.  Hepatitis A vaccine.** / Consult your health care provider.  Hepatitis B vaccine.** / Consult your health care provider.  Haemophilus influenzae type b (Hib) vaccine.** / Consult your health care provider. ** Family history and personal history of risk and conditions may change your health care provider's recommendations. Document Released: 05/17/2001 Document Revised: 08/05/2013 Document Reviewed: 08/16/2010 [facility] Patient Information 2015 [place], [place]. This information is not intended to replace advice given to you by your health care provider. Make sure you discuss any questions you have with your health care provider.

## 2014-11-03 ENCOUNTER — Ambulatory Visit (INDEPENDENT_AMBULATORY_CARE_PROVIDER_SITE_OTHER): Payer: Commercial Managed Care - HMO | Admitting: Family

## 2014-11-03 ENCOUNTER — Other Ambulatory Visit: Payer: Self-pay | Admitting: Family

## 2014-11-03 ENCOUNTER — Encounter: Payer: Self-pay | Admitting: Family

## 2014-11-03 VITALS — BP 126/82 | HR 87 | Temp 98.7°F | Resp 16 | Ht 65.0 in | Wt 186.4 lb

## 2014-11-03 DIAGNOSIS — Z113 Encounter for screening for infections with a predominantly sexual mode of transmission: Secondary | ICD-10-CM | POA: Diagnosis not present

## 2014-11-03 DIAGNOSIS — N760 Acute vaginitis: Secondary | ICD-10-CM | POA: Diagnosis not present

## 2014-11-03 MED ORDER — FLUCONAZOLE 150 MG PO TABS: ORAL_TABLET | ORAL | Status: DC

## 2014-11-03 NOTE — Patient Instructions (Signed)
Start diflucan (rx sent to your pharmacy). Apply vagisil externally as needed for discomfort. Call if symptoms worsen or if symptoms are not improved in 1 week.

## 2014-11-03 NOTE — Addendum Note (Signed)
Addended by: Tasia Catchings on: 11/03/2014 11:48 AM   Modules accepted: Orders

## 2014-11-03 NOTE — Progress Notes (Signed)
Pre visit review using our clinic review tool, if applicable. No additional management support is needed unless otherwise documented below in the visit note. 

## 2014-11-03 NOTE — Progress Notes (Signed)
Subjective:    Patient ID: Mindy Anderson, female    DOB: 1950/12/22, 64 y.o.   MRN: [id number]  HPI  Mindy Anderson is a 64 yr old female who presents today with chief complaint of vaginal itching. Reports that symptoms started 2 weeks ago.  Has tried desitin, neosporin, vinegar bath without relief. Reports area burns and hurts.  She had surgery a few months back (Gallbladder). She denies discharge or odor.   Review of Systems    see HPI  Past Medical History  Diagnosis Date  . Hypertension   . Complication of anesthesia   . PONV (postoperative nausea and vomiting)   . Family history of adverse reaction to anesthesia     mother of pt. wakes slowly  . Arthritis     hands, knees     History   Social History  . Marital Status: Married    Spouse Name: N/A  . Number of Children: N/A  . Years of Education: N/A   Occupational History  . retired    Social History Main Topics  . Smoking status: Never Smoker   . Smokeless tobacco: Never Used  . Alcohol Use: No  . Drug Use: No  . Sexual Activity:    Partners: Male   Other Topics Concern  . Not on file   Social History Narrative   Exercise-- no    Past Surgical History  Procedure Laterality Date  . Band aid surgery  1980's  . Tubal ligation    . Cholecystectomy N/A 06/25/2014    Procedure: LAPAROSCOPIC CHOLECYSTECTOMY;  Surgeon: Ralene Ok, MD;  Location: [facility] OR;  Service: General;  Laterality: N/A;    Family History  Problem Relation Age of Onset  . Uterine cancer Mother   . Heart disease Mother 54    chf  . Breast cancer Mother   . Cervical cancer Mother   . Cancer Mother     skin  . Breast cancer Sister   . Diabetes Sister   . COPD Father   . Heart disease Father 55    MI  . Heart disease Brother     cabg with stents  . Cancer Brother     Skin cancer  . Colon cancer Sister   . Lung cancer Sister   . Liver cancer Sister   . Breast cancer Sister   . Liver cancer Sister   . Rectal cancer Neg Hx    . Stomach cancer Neg Hx   . Esophageal cancer Neg Hx   . Colon polyps Daughter     Allergies  Allergen Reactions  . Hydrocodone Itching and Nausea And Vomiting  . Tramadol Nausea Only    Current Outpatient Prescriptions on File Prior to Visit  Medication Sig Dispense Refill  . aspirin 81 MG tablet Take 81 mg by mouth daily.      . calcium carbonate (OS-CAL) 600 MG TABS Take 600 mg by mouth 2 (two) times daily with a meal.      . Cholecalciferol (VITAMIN D) 2000 UNITS tablet Take 2,000 Units by mouth daily.      . hydrochlorothiazide (HYDRODIURIL) 25 MG tablet TAKE 1 TABLET EVERY DAY  (SUBSTITUTED  FOR  HYDRODIURIL) 90 tablet 1  . ibuprofen (ADVIL,MOTRIN) 400 MG tablet Take 400 mg by mouth every 6 (six) hours as needed for mild pain.    . Omega-3 Fatty Acids (FISH OIL) 600 MG CAPS Take 1,200 mg by mouth daily.    . Potassium 95 MG  TABS Take 1 tablet by mouth daily.       No current facility-administered medications on file prior to visit.    BP 126/82 mmHg  Pulse 87  Temp(Src) 98.7 F (37.1 C) (Oral)  Resp 16  Ht 5\' 5"  (1.651 m)  Wt 186 lb 6.4 oz (84.55 kg)  BMI 31.02 kg/m2  SpO2 98%    Objective:   Physical Exam  Constitutional: She appears well-developed and well-nourished.  Cardiovascular: Normal rate, regular rhythm and normal heart sounds.   No murmur heard. Pulmonary/Chest: Effort normal and breath sounds normal. No respiratory distress. She has no wheezes.  Genitourinary:  External vaginal irritation/dry irritated skin noted with whitish coating  Psychiatric: She has a normal mood and affect. Her behavior is normal. Judgment and thought content normal.          Assessment & Plan:

## 2014-11-03 NOTE — Assessment & Plan Note (Addendum)
Wet prep sent to lab. Advised pt:  Start diflucan (rx sent to your pharmacy). Apply vagisil externally as needed for discomfort. Call if symptoms worsen or if symptoms are not improved in 1 week.

## 2014-11-04 ENCOUNTER — Telehealth: Payer: Self-pay | Admitting: Family

## 2014-11-04 MED ORDER — TRIAMCINOLONE ACETONIDE 0.1 % EX CREA: 1.0000 "application " | TOPICAL_CREAM | Freq: Two times a day (BID) | CUTANEOUS | Status: DC | PRN

## 2014-11-04 NOTE — Telephone Encounter (Signed)
Please contact pt and let her know that I reviewed her testing and testing is negative for yeast and bacteria.  However, I still believe she could have some external yeast so I recommend that she take the diflucan.  I will also send rx for steroid cream for external vulvar irritation.  If symptoms are not improved in 1 week let me know and I will place referral for her to see GYN.

## 2014-11-04 NOTE — Telephone Encounter (Signed)
Pt returning call, call back # [phone]

## 2014-11-04 NOTE — Telephone Encounter (Signed)
Left message for pt to return my call about test results.

## 2014-11-05 NOTE — Telephone Encounter (Signed)
Notified pt and she voices understanding. 

## 2014-11-16 ENCOUNTER — Ambulatory Visit (HOSPITAL_BASED_OUTPATIENT_CLINIC_OR_DEPARTMENT_OTHER): Payer: Commercial Managed Care - HMO

## 2014-12-05 ENCOUNTER — Other Ambulatory Visit: Payer: Self-pay

## 2014-12-05 DIAGNOSIS — Z1231 Encounter for screening mammogram for malignant neoplasm of breast: Secondary | ICD-10-CM

## 2014-12-12 ENCOUNTER — Ambulatory Visit
Admission: RE | Admit: 2014-12-12 | Discharge: 2014-12-12 | Disposition: A | Discharge status: Home / Self Care | Payer: Commercial Managed Care - HMO | Source: Ambulatory Visit

## 2014-12-12 DIAGNOSIS — Z1231 Encounter for screening mammogram for malignant neoplasm of breast: Secondary | ICD-10-CM | POA: Diagnosis not present

## 2015-01-09 ENCOUNTER — Ambulatory Visit (INDEPENDENT_AMBULATORY_CARE_PROVIDER_SITE_OTHER): Payer: Commercial Managed Care - HMO

## 2015-01-09 DIAGNOSIS — Z23 Encounter for immunization: Secondary | ICD-10-CM

## 2015-02-18 ENCOUNTER — Other Ambulatory Visit: Payer: Self-pay | Admitting: Family Medicine

## 2015-10-07 ENCOUNTER — Other Ambulatory Visit: Payer: Self-pay | Admitting: Family Medicine

## 2015-10-07 NOTE — Telephone Encounter (Signed)
Rx sent to the pharmacy by e-script.  However only given #90.  The patient needs further evaluation and/or laboratory testing before further refills are given.  Ask patient to make an appointment for this.//AB/CMA  

## 2015-12-16 ENCOUNTER — Telehealth: Payer: Self-pay | Admitting: Family Medicine

## 2015-12-16 NOTE — Telephone Encounter (Signed)
Noted, thank you

## 2015-12-16 NOTE — Telephone Encounter (Signed)
Patient last medicare wellness (10/21/14 [place] PP[place], SUBSEQ VISIT [id number] patient scheduled medicare wellness for 02/29/2016 with RN AT 9:45 and physical appointment at 10:30am.

## 2016-01-11 ENCOUNTER — Ambulatory Visit: Payer: Commercial Managed Care - HMO | Admitting: *Deleted

## 2016-01-29 ENCOUNTER — Other Ambulatory Visit: Payer: Self-pay | Admitting: Family Medicine

## 2016-01-29 DIAGNOSIS — Z1231 Encounter for screening mammogram for malignant neoplasm of breast: Secondary | ICD-10-CM

## 2016-02-29 ENCOUNTER — Encounter: Payer: Commercial Managed Care - HMO | Admitting: Family Medicine

## 2016-02-29 ENCOUNTER — Ambulatory Visit: Payer: Commercial Managed Care - HMO | Admitting: *Deleted

## 2016-03-02 ENCOUNTER — Ambulatory Visit: Payer: Commercial Managed Care - HMO

## 2016-04-08 NOTE — Progress Notes (Signed)
Subjective:   Mindy Anderson is a 66 y.o. female who presents for Medicare Annual (Subsequent) preventive examination.  Review of Systems:  No ROS.  Medicare Wellness Visit.  Cardiac Risk Factors include: advanced age (>29men, >56 women);hypertension;obesity (BMI >30kg/m2)  Sleep patterns: no sleep issues, feels rested on waking, does not get up to void and 8 hours nightly.  Home Safety/Smoke Alarms: Feels safe in home. Smoke alarms in place.  Living environment; residence and Firearm Safety: Lives w/ husband. 1-story house/ trailer, number of outside stairs: 4-5, firearms stored safely. Seat Belt Safety/Bike Helmet: Wears seat belt.   Counseling:   Eye Exam- Dr. Bing Plume [facility] Dental- Does not follow w/ dentist regularly.   Female:   Pap- Aged out      Mammo- last 12/12/14. BI-RADS CATEGORY  1: Negative. Next scheduled for Wednesday.    Dexa scan- last 10/29/13. Osteopenia. PCP recommended OTC calcium + Vitamin D supplement, recheck 2 years.       CCS- last 12/26/13 w/ Dr. Lucio Edward. Mild diverticulosis, otherwise normal. 10 year recall.    Objective:     Vitals: BP 138/86 (BP Location: Right Arm, Patient Position: Sitting, Cuff Size: Normal)   Pulse 94   Resp 16   Ht 5' 4.75" (1.645 m)   Wt 205 lb 12.8 oz (93.4 kg)   SpO2 98%   BMI 34.51 kg/m   Body mass index is 34.51 kg/m.   Tobacco History  Smoking Status  . Never Smoker  Smokeless Tobacco  . Never Used     Counseling given: Not Answered   Past Medical History:  Diagnosis Date  . Arthritis    hands, knees   . Complication of anesthesia   . Family history of adverse reaction to anesthesia    mother of pt. wakes slowly  . Hypertension   . PONV (postoperative nausea and vomiting)    Past Surgical History:  Procedure Laterality Date  . Band Aid surgery  1980's  . CHOLECYSTECTOMY N/A 06/25/2014   Procedure: LAPAROSCOPIC CHOLECYSTECTOMY;  Surgeon: Ralene Ok, MD;  Location: [facility] OR;  Service: General;   Laterality: N/A;  . TUBAL LIGATION     Family History  Problem Relation Age of Onset  . Uterine cancer Mother   . Heart disease Mother 89    chf  . Breast cancer Mother   . Cervical cancer Mother   . Cancer Mother     skin  . COPD Father   . Heart disease Father 58    MI  . Heart disease Brother     cabg with stents  . Cancer Brother     Skin cancer  . Lung cancer Sister   . Liver cancer Sister   . Breast cancer Sister   . Colon cancer Sister   . Colon polyps Daughter   . Rectal cancer Neg Hx   . Stomach cancer Neg Hx   . Esophageal cancer Neg Hx    History  Sexual Activity  . Sexual activity: No    Outpatient Encounter Prescriptions as of 04/11/2016  Medication Sig  . aspirin 81 MG tablet Take 81 mg by mouth daily.    . hydrochlorothiazide (HYDRODIURIL) 25 MG tablet TAKE 1 TABLET EVERY DAY  (SUBSTITUTED  FOR  HYDRODIURIL)  . ibuprofen (ADVIL,MOTRIN) 400 MG tablet Take 400 mg by mouth every 6 (six) hours as needed for mild pain.  . Multiple Vitamins-Minerals (ONE DAILY MULTIVITAMIN WOMEN PO) Take 3 tablets by mouth 2 (two) times  daily.  . fluconazole (DIFLUCAN) 150 MG tablet 1 tab by mouth today, may repeat in 3 days if symptoms persist  . triamcinolone cream (KENALOG) 0.1 % Apply 1 application topically 2 (two) times daily as needed. (Patient not taking: Reported on 04/11/2016)  . [DISCONTINUED] calcium carbonate (OS-CAL) 600 MG TABS Take 600 mg by mouth 2 (two) times daily with a meal.    . [DISCONTINUED] Cholecalciferol (VITAMIN D) 2000 UNITS tablet Take 2,000 Units by mouth daily.    . [DISCONTINUED] Omega-3 Fatty Acids (FISH OIL) 600 MG CAPS Take 1,200 mg by mouth daily.  . [DISCONTINUED] Potassium 95 MG TABS Take 1 tablet by mouth daily.     No facility-administered encounter medications on file as of 04/11/2016.     Activities of Daily Living In your present state of health, do you have any difficulty performing the following activities: 04/11/2016  Hearing? N    Vision? N  Difficulty concentrating or making decisions? N  Walking or climbing stairs? N  Dressing or bathing? N  Doing errands, shopping? N  Preparing Food and eating ? N  Using the Toilet? N  In the past six months, have you accidently leaked urine? Y  Do you have problems with loss of bowel control? Y  Managing your Medications? N  Managing your Finances? N  Housekeeping or managing your Housekeeping? N  Some recent data might be hidden    Patient Care Team: Ann Held, DO as PCP - General Calvert Cantor, MD as Consulting Physician (Ophthalmology)    Assessment:    Physical assessment deferred to PCP.  Exercise Activities and Dietary recommendations Current Exercise Habits: The patient does not participate in regular exercise at present Diet (meal preparation, eat out, water intake, caffeinated beverages, dairy products, fruits and vegetables): on average, 2-3 meals per day. Makes most meals at home. Drinks lots of water. Rarely drinks soda.  Breakfast: Eggs, grits, bacon.  Lunch: sometimes skips lunch Dinner: Varies     Goals    . Increase physical activity      Fall Risk Fall Risk  04/11/2016 10/21/2014 10/21/2014 10/17/2013  Falls in the past year? Yes No No No  Number falls in past yr: 2 or more - - -  Injury with Fall? No - - -  Follow up Education provided;Falls prevention discussed - - -   Depression Screen PHQ 2/9 Scores 04/11/2016 10/21/2014 10/21/2014 10/17/2013  PHQ - 2 Score 0 0 0 0     Cognitive Function MMSE - Mini Mental State Exam 04/11/2016  Orientation to time 5  Orientation to Place 5  Registration 3  Attention/ Calculation 5  Recall 3  Language- name 2 objects 2  Language- repeat 1  Language- follow 3 step command 3  Language- read & follow direction 1  Write a sentence 1  Copy design 1  Total score 30        Immunization History  Administered Date(s) Administered  . Influenza Split 02/09/2011  . Influenza Whole 01/09/2008,  03/11/2009, 03/09/2010, 02/03/2012  . Influenza,inj,Quad PF,36+ Mos 01/15/2014, 01/09/2015  . Influenza-Unspecified 01/29/2016  . Pneumococcal Conjugate-13 10/21/2014  . Pneumococcal Polysaccharide-23 10/17/2013  . Td 02/11/2008  . Zoster 05/18/2009   Screening Tests Health Maintenance  Topic Date Due  . Hepatitis C Screening  03/04/1951  . MAMMOGRAM  12/11/2016  . TETANUS/TDAP  02/10/2018  . COLONOSCOPY  12/27/2023  . INFLUENZA VACCINE  Completed  . DEXA SCAN  Completed  . ZOSTAVAX  Completed  .  PNA vac Low Risk Adult  Completed      Plan:   Follow-up w/ PCP as directed. Schedule appt w/ Dr. Bing Plume for routine eye exam. Orders placed for DEXA.  During the course of the visit the patient was educated and counseled about the following appropriate screening and preventive services:   Vaccines to include Pneumoccal, Influenza, Hepatitis B, Td, Zostavax, HCV  Cardiovascular Disease  Colorectal cancer screening  Bone density screening  Diabetes screening  Glaucoma screening  Mammography/PAP  Nutrition counseling   Patient Instructions (the written plan) was given to the patient.   Dorrene German, RN  04/11/2016

## 2016-04-08 NOTE — Progress Notes (Unsigned)
Subjective:   Mindy Anderson is a 66 y.o. female who presents for Medicare Annual (Subsequent) preventive examination.  Review of Systems:  No ROS.  Medicare Wellness Visit.     Sleep patterns: {SX; SLEEP PATTERNS:18802::"feels rested on waking","does not get up to void","gets up *** times nightly to void","*** hours nightly"}.   Home Safety/Smoke Alarms:   Living environment; residence and Firearm Safety: {Rehab home environment / accessibility:30080::"no firearms","firearms stored safely"}. Seat Belt Safety/Bike Helmet: Wears seat belt.   Counseling:   Eye Exam-  Dental-  Female:   Pap- Aged out       Mammo-       Dexa scan-        CCS-    Objective:     Vitals: There were no vitals taken for this visit.  There is no height or weight on file to calculate BMI.   Tobacco History  Smoking Status  . Never Smoker  Smokeless Tobacco  . Never Used     Counseling given: Not Answered   Past Medical History:  Diagnosis Date  . Arthritis    hands, knees   . Complication of anesthesia   . Family history of adverse reaction to anesthesia    mother of pt. wakes slowly  . Hypertension   . PONV (postoperative nausea and vomiting)    Past Surgical History:  Procedure Laterality Date  . Band Aid surgery  1980's  . CHOLECYSTECTOMY N/A 06/25/2014   Procedure: LAPAROSCOPIC CHOLECYSTECTOMY;  Surgeon: Ralene Ok, MD;  Location[facility] OR;  Service: General;  Laterality: N/A;  . TUBAL LIGATION     Family History  Problem Relation Age of Onset  . Uterine cancer Mother   . Heart disease Mother 55    chf  . Breast cancer Mother   . Cervical cancer Mother   . Cancer Mother     skin  . Breast cancer Sister   . Diabetes Sister   . COPD Father   . Heart disease Father 2    MI  . Heart disease Brother     cabg with stents  . Cancer Brother     Skin cancer  . Colon cancer Sister   . Lung cancer Sister   . Liver cancer Sister   . Breast cancer Sister   . Liver  cancer Sister   . Rectal cancer Neg Hx   . Stomach cancer Neg Hx   . Esophageal cancer Neg Hx   . Colon polyps Daughter    History  Sexual Activity  . Sexual activity: Yes  . Partners: Male    Outpatient Encounter Prescriptions as of 04/11/2016  Medication Sig  . aspirin 81 MG tablet Take 81 mg by mouth daily.    . calcium carbonate (OS-CAL) 600 MG TABS Take 600 mg by mouth 2 (two) times daily with a meal.    . Cholecalciferol (VITAMIN D) 2000 UNITS tablet Take 2,000 Units by mouth daily.    . fluconazole (DIFLUCAN) 150 MG tablet 1 tab by mouth today, may repeat in 3 days if symptoms persist  . hydrochlorothiazide (HYDRODIURIL) 25 MG tablet TAKE 1 TABLET EVERY DAY  (SUBSTITUTED  FOR  HYDRODIURIL)  . ibuprofen (ADVIL,MOTRIN) 400 MG tablet Take 400 mg by mouth every 6 (six) hours as needed for mild pain.  . Omega-3 Fatty Acids (FISH OIL) 600 MG CAPS Take 1,200 mg by mouth daily.  . Potassium 95 MG TABS Take 1 tablet by mouth daily.    Marland Kitchen  triamcinolone cream (KENALOG) 0.1 % Apply 1 application topically 2 (two) times daily as needed.   No facility-administered encounter medications on file as of 04/11/2016.     Activities of Daily Living No flowsheet data found.  Patient Care Team: Ann Held, DO as PCP - General Calvert Cantor, MD as Consulting Physician (Ophthalmology)    Assessment:    *** Exercise Activities and Dietary recommendations    Goals    None     Fall Risk Fall Risk  10/21/2014 10/21/2014 10/17/2013  Falls in the past year? No No No   Depression Screen PHQ 2/9 Scores 10/21/2014 10/21/2014 10/17/2013 04/02/2012  PHQ - 2 Score 0 0 0 0     Cognitive Function        Immunization History  Administered Date(s) Administered  . Influenza Split 02/09/2011  . Influenza Whole 01/09/2008, 03/11/2009, 03/09/2010, 02/03/2012  . Influenza,inj,Quad PF,36+ Mos 01/15/2014, 01/09/2015  . Pneumococcal Conjugate-13 10/21/2014  . Pneumococcal Polysaccharide-23  10/17/2013  . Td 02/11/2008  . Zoster 05/18/2009   Screening Tests Health Maintenance  Topic Date Due  . Hepatitis C Screening  June 05, 1950  . INFLUENZA VACCINE  11/03/2015  . MAMMOGRAM  12/11/2016  . TETANUS/TDAP  02/10/2018  . COLONOSCOPY  12/27/2023  . DEXA SCAN  Completed  . ZOSTAVAX  Completed  . PNA vac Low Risk Adult  Completed      Plan:   *** During the course of the visit the patient was educated and counseled about the following appropriate screening and preventive services:   Vaccines to include Pneumoccal, Influenza, Hepatitis B, Td, Zostavax, HCV  Electrocardiogram  Cardiovascular Disease  Colorectal cancer screening  Bone density screening  Diabetes screening  Glaucoma screening  Mammography/PAP  Nutrition counseling   Patient Instructions (the written plan) was given to the patient.   Dorrene German, RN  04/08/2016

## 2016-04-08 NOTE — Progress Notes (Deleted)
Pre visit review using our clinic review tool, if applicable. No additional management support is needed unless otherwise documented below in the visit note. 

## 2016-04-08 NOTE — Progress Notes (Signed)
Pre visit review using our clinic review tool, if applicable. No additional management support is needed unless otherwise documented below in the visit note. 

## 2016-04-11 ENCOUNTER — Ambulatory Visit: Payer: Commercial Managed Care - HMO | Admitting: *Deleted

## 2016-04-11 ENCOUNTER — Ambulatory Visit (INDEPENDENT_AMBULATORY_CARE_PROVIDER_SITE_OTHER): Payer: Medicare HMO | Admitting: Family Medicine

## 2016-04-11 ENCOUNTER — Encounter: Payer: Self-pay | Admitting: Family Medicine

## 2016-04-11 VITALS — BP 138/86 | HR 94 | Resp 16 | Ht 64.75 in | Wt 205.8 lb

## 2016-04-11 DIAGNOSIS — R319 Hematuria, unspecified: Secondary | ICD-10-CM

## 2016-04-11 DIAGNOSIS — Z0000 Encounter for general adult medical examination without abnormal findings: Secondary | ICD-10-CM | POA: Diagnosis not present

## 2016-04-11 DIAGNOSIS — R8290 Unspecified abnormal findings in urine: Secondary | ICD-10-CM

## 2016-04-11 DIAGNOSIS — Z780 Asymptomatic menopausal state: Secondary | ICD-10-CM | POA: Diagnosis not present

## 2016-04-11 DIAGNOSIS — M8580 Other specified disorders of bone density and structure, unspecified site: Secondary | ICD-10-CM | POA: Diagnosis not present

## 2016-04-11 DIAGNOSIS — I10 Essential (primary) hypertension: Secondary | ICD-10-CM | POA: Diagnosis not present

## 2016-04-11 LAB — POC URINALSYSI DIPSTICK (AUTOMATED)
Bilirubin, UA: NEGATIVE
Glucose, UA: NEGATIVE
Ketones, UA: NEGATIVE
LEUKOCYTES UA: NEGATIVE
Nitrite, UA: NEGATIVE
Protein, UA: NEGATIVE
UROBILINOGEN UA: 0.2
pH, UA: 6

## 2016-04-11 MED ORDER — HYDROCHLOROTHIAZIDE 25 MG PO TABS
ORAL_TABLET | ORAL | 3 refills | Status: DC
Start: 2016-04-11 — End: 2017-05-03

## 2016-04-11 NOTE — Patient Instructions (Addendum)
Create your advance directives and bring a copy to your next office visit.  Schedule appt w/ Dr. Bing Plume for routine eye exam.  Advance Directive Advance directives are the legal documents that allow you to make choices about your health care and medical treatment if you cannot speak for yourself. Advance directives are a way for you to communicate your wishes to family, friends, and health care providers. The specified people can then convey your decisions about end-of-life care to avoid confusion if you should become unable to communicate. Ideally, the process of discussing and writing advance directives should happen over time rather than making decisions all at once. Advance directives can be modified as your situation changes, and you can change your mind at any time, even after you have signed the advance directives. Each state has its own laws regarding advance directives. You may want to check with your health care provider, attorney, or state representative about the law in your state. Below are some examples of advance directives. HEALTH CARE PROXY AND DURABLE POWER OF ATTORNEY FOR HEALTH CARE A health care proxy is a person (agent) appointed to make medical decisions for you if you cannot. Generally, people choose someone they know well and trust to represent their preferences when they can no longer do so. You should be sure to ask this person for agreement to act as your agent. An agent may have to exercise judgment in the event of a medical decision for which your wishes are not known. A durable power of attorney for health care is a legal document that names your health care proxy. Depending on the laws in your state, after the document is written, it may also need to be:  Signed.  Notarized.  Dated.  Copied.  Witnessed.  Incorporated into your medical record. You may also want to appoint someone to manage your financial affairs if you cannot. This is called a durable power of  attorney for finances. It is a separate legal document from the durable power of attorney for health care. You may choose the same person or someone different from your health care proxy to act as your agent in financial matters. LIVING WILL A living will is a set of instructions documenting your wishes about medical care when you cannot care for yourself. It is used if you become:  Terminally ill.  Incapacitated.  Unable to communicate.  Unable to make decisions. Items to consider in your living will include:  The use or non-use of life-sustaining equipment, such as dialysis machines and breathing machines (ventilators).  A do not resuscitate (DNR) order, which is the instruction not to use cardiopulmonary resuscitation (CPR) if breathing or heartbeat stops.  Tube feeding.  Withholding of food and fluids.  Comfort (palliative) care when the goal becomes comfort rather than a cure.  Organ and tissue donation. A living will does not give instructions about distribution of your money and property if you should pass away. It is advisable to seek the expert advice of a lawyer in drawing up a will regarding your possessions. Decisions about taxes, beneficiaries, and asset distribution will be legally binding. This process can relieve your family and friends of any burdens surrounding disputes or questions that may come up about the allocation of your assets. DO NOT RESUSCITATE (DNR) A do not resuscitate (DNR) order is a request to not have CPR in the event that your heart stops beating or you stop breathing. Unless given other instructions, a health care provider will try  to help any patient whose heart has stopped or who has stopped breathing.  This information is not intended to replace advice given to you by your health care provider. Make sure you discuss any questions you have with your health care provider. Document Released: 06/28/2007 Document Revised: 07/13/2015 Document Reviewed:  08/08/2012 Elsevier Interactive Patient Education  2017 [place] Prevention in the Home Introduction Falls can cause injuries. They can happen to people of all ages. There are many things you can do to make your home safe and to help prevent falls. What can I do on the outside of my home?  Regularly fix the edges of walkways and driveways and fix any cracks.  Remove anything that might make you trip as you walk through a door, such as a raised step or threshold.  Trim any bushes or trees on the path to your home.  Use bright outdoor lighting.  Clear any walking paths of anything that might make someone trip, such as rocks or tools.  Regularly check to see if handrails are loose or broken. Make sure that both sides of any steps have handrails.  Any raised decks and porches should have guardrails on the edges.  Have any leaves, snow, or ice cleared regularly.  Use sand or salt on walking paths during winter.  Clean up any spills in your garage right away. This includes oil or grease spills. What can I do in the bathroom?  Use night lights.  Install grab bars by the toilet and in the tub and shower. Do not use towel bars as grab bars.  Use non-skid mats or decals in the tub or shower.  If you need to sit down in the shower, use a plastic, non-slip stool.  Keep the floor dry. Clean up any water that spills on the floor as soon as it happens.  Remove soap buildup in the tub or shower regularly.  Attach bath mats securely with double-sided non-slip rug tape.  Do not have throw rugs and other things on the floor that can make you trip. What can I do in the bedroom?  Use night lights.  Make sure that you have a light by your bed that is easy to reach.  Do not use any sheets or blankets that are too big for your bed. They should not hang down onto the floor.  Have a firm chair that has side arms. You can use this for support while you get dressed.  Do not have  throw rugs and other things on the floor that can make you trip. What can I do in the kitchen?  Clean up any spills right away.  Avoid walking on wet floors.  Keep items that you use a lot in easy-to-reach places.  If you need to reach something above you, use a strong step stool that has a grab bar.  Keep electrical cords out of the way.  Do not use floor polish or wax that makes floors slippery. If you must use wax, use non-skid floor wax.  Do not have throw rugs and other things on the floor that can make you trip. What can I do with my stairs?  Do not leave any items on the stairs.  Make sure that there are handrails on both sides of the stairs and use them. Fix handrails that are broken or loose. Make sure that handrails are as long as the stairways.  Check any carpeting to make sure that it is firmly  attached to the stairs. Fix any carpet that is loose or worn.  Avoid having throw rugs at the top or bottom of the stairs. If you do have throw rugs, attach them to the floor with carpet tape.  Make sure that you have a light switch at the top of the stairs and the bottom of the stairs. If you do not have them, ask someone to add them for you. What else can I do to help prevent falls?  Wear shoes that:  Do not have high heels.  Have rubber bottoms.  Are comfortable and fit you well.  Are closed at the toe. Do not wear sandals.  If you use a stepladder:  Make sure that it is fully opened. Do not climb a closed stepladder.  Make sure that both sides of the stepladder are locked into place.  Ask someone to hold it for you, if possible.  Clearly mark and make sure that you can see:  Any grab bars or handrails.  First and last steps.  Where the edge of each step is.  Use tools that help you move around (mobility aids) if they are needed. These include:  Canes.  Walkers.  Scooters.  Crutches.  Turn on the lights when you go into a dark area. Replace any  light bulbs as soon as they burn out.  Set up your furniture so you have a clear path. Avoid moving your furniture around.  If any of your floors are uneven, fix them.  If there are any pets around you, be aware of where they are.  Review your medicines with your doctor. Some medicines can make you feel dizzy. This can increase your chance of falling. Ask your doctor what other things that you can do to help prevent falls. This information is not intended to replace advice given to you by your health care provider. Make sure you discuss any questions you have with your health care provider. Document Released: 01/15/2009 Document Revised: 08/27/2015 Document Reviewed: 04/25/2014  2017 Elsevier

## 2016-04-11 NOTE — Progress Notes (Signed)
Pre visit review using our clinic review tool, if applicable. No additional management support is needed unless otherwise documented below in the visit note. 

## 2016-04-11 NOTE — Progress Notes (Signed)
Subjective:    Patient ID: Mindy Anderson, female    DOB: 10-09-1950, 66 y.o.   MRN: [id number]  Chief Complaint  Patient presents with  . Medicare Wellness    w/ Hoyle Sauer  . Annual Exam    HPI Patient is in today for CPE.  No other complaints.  Past Medical History:  Diagnosis Date  . Arthritis    hands, knees   . Complication of anesthesia   . Family history of adverse reaction to anesthesia    mother of pt. wakes slowly  . Hypertension   . PONV (postoperative nausea and vomiting)     Past Surgical History:  Procedure Laterality Date  . Band Aid surgery  1980's  . CHOLECYSTECTOMY N/A 06/25/2014   Procedure: LAPAROSCOPIC CHOLECYSTECTOMY;  Surgeon: Ralene Ok, MD;  Location: [facility] OR;  Service: General;  Laterality: N/A;  . TUBAL LIGATION      Family History  Problem Relation Age of Onset  . Uterine cancer Mother   . Heart disease Mother 8    chf  . Breast cancer Mother   . Cervical cancer Mother   . Cancer Mother     skin  . COPD Father   . Heart disease Father 37    MI  . Heart disease Brother     cabg with stents  . Cancer Brother     Skin cancer  . Lung cancer Sister   . Liver cancer Sister   . Breast cancer Sister   . Colon cancer Sister   . Colon polyps Daughter   . Rectal cancer Neg Hx   . Stomach cancer Neg Hx   . Esophageal cancer Neg Hx     Social History   Social History  . Marital status: Married    Spouse name: N/A  . Number of children: N/A  . Years of education: N/A   Occupational History  . retired    Social History Main Topics  . Smoking status: Never Smoker  . Smokeless tobacco: Never Used  . Alcohol use No  . Drug use: No  . Sexual activity: No   Other Topics Concern  . Not on file   Social History Narrative   Exercise-- no    Outpatient Medications Prior to Visit  Medication Sig Dispense Refill  . aspirin 81 MG tablet Take 81 mg by mouth daily.      Marland Kitchen ibuprofen (ADVIL,MOTRIN) 400 MG tablet Take 400 mg by  mouth every 6 (six) hours as needed for mild pain.    . hydrochlorothiazide (HYDRODIURIL) 25 MG tablet TAKE 1 TABLET EVERY DAY  (SUBSTITUTED  FOR  HYDRODIURIL) 90 tablet 0  . fluconazole (DIFLUCAN) 150 MG tablet 1 tab by mouth today, may repeat in 3 days if symptoms persist 2 tablet 0  . triamcinolone cream (KENALOG) 0.1 % Apply 1 application topically 2 (two) times daily as needed. (Patient not taking: Reported on 04/11/2016) 30 g 0  . calcium carbonate (OS-CAL) 600 MG TABS Take 600 mg by mouth 2 (two) times daily with a meal.      . Cholecalciferol (VITAMIN D) 2000 UNITS tablet Take 2,000 Units by mouth daily.      . Omega-3 Fatty Acids (FISH OIL) 600 MG CAPS Take 1,200 mg by mouth daily.    . Potassium 95 MG TABS Take 1 tablet by mouth daily.       No facility-administered medications prior to visit.     Allergies  Allergen Reactions  .  Hydrocodone Itching and Nausea And Vomiting  . Tramadol Nausea Only    Review of Systems  Constitutional: Negative for fever and malaise/fatigue.  HENT: Negative for congestion.   Eyes: Negative for blurred vision.  Respiratory: Negative for cough and shortness of breath.   Cardiovascular: Negative for chest pain, palpitations and leg swelling.  Gastrointestinal: Negative for vomiting.  Musculoskeletal: Negative for back pain.  Skin: Negative for rash.  Neurological: Negative for loss of consciousness and headaches.       Objective:    Physical Exam  Constitutional: She is oriented to person, place, and time. She appears well-developed and well-nourished. No distress.  HENT:  Head: Normocephalic and atraumatic.  Eyes: Conjunctivae are normal.  Neck: Normal range of motion. No thyromegaly present.  Cardiovascular: Normal rate and regular rhythm.   Pulmonary/Chest: Effort normal and breath sounds normal. She has no wheezes.  Abdominal: Soft. Bowel sounds are normal. There is no tenderness.  Musculoskeletal: Normal range of motion. She exhibits  no edema or deformity.  Neurological: She is alert and oriented to person, place, and time.  Skin: Skin is warm and dry. She is not diaphoretic.  Psychiatric: She has a normal mood and affect.    BP 138/86 (BP Location: Right Arm, Patient Position: Sitting, Cuff Size: Normal)   Pulse 94   Resp 16   Ht 5' 4.75" (1.645 m)   Wt 205 lb 12.8 oz (93.4 kg)   SpO2 98%   BMI 34.51 kg/m  Wt Readings from Last 3 Encounters:  04/11/16 205 lb 12.8 oz (93.4 kg)  11/03/14 186 lb 6.4 oz (84.6 kg)  10/21/14 186 lb 6.4 oz (84.6 kg)     Lab Results  Component Value Date   WBC 6.3 10/21/2014   HGB 13.6 10/21/2014   HCT 40.2 10/21/2014   PLT 189.0 10/21/2014   GLUCOSE 107 (H) 10/21/2014   CHOL 165 10/21/2014   TRIG 70.0 10/21/2014   HDL 52.00 10/21/2014   LDLCALC 99 10/21/2014   ALT 15 10/21/2014   AST 19 10/21/2014   NA 140 10/21/2014   K 3.7 10/21/2014   CL 103 10/21/2014   CREATININE 0.62 10/21/2014   BUN 12 10/21/2014   CO2 32 10/21/2014   TSH 1.54 04/02/2012   MICROALBUR 0.8 04/02/2012    Lab Results  Component Value Date   TSH 1.54 04/02/2012   Lab Results  Component Value Date   WBC 6.3 10/21/2014   HGB 13.6 10/21/2014   HCT 40.2 10/21/2014   MCV 89.9 10/21/2014   PLT 189.0 10/21/2014   Lab Results  Component Value Date   NA 140 10/21/2014   K 3.7 10/21/2014   CO2 32 10/21/2014   GLUCOSE 107 (H) 10/21/2014   BUN 12 10/21/2014   CREATININE 0.62 10/21/2014   BILITOT 0.4 10/21/2014   ALKPHOS 65 10/21/2014   AST 19 10/21/2014   ALT 15 10/21/2014   PROT 6.9 10/21/2014   ALBUMIN 3.7 10/21/2014   CALCIUM 9.1 10/21/2014   ANIONGAP 9 06/19/2014   GFR 102.35 10/21/2014   Lab Results  Component Value Date   CHOL 165 10/21/2014   Lab Results  Component Value Date   HDL 52.00 10/21/2014   Lab Results  Component Value Date   LDLCALC 99 10/21/2014   Lab Results  Component Value Date   TRIG 70.0 10/21/2014   Lab Results  Component Value Date   CHOLHDL 3  10/21/2014   No results found for: HGBA1C  Assessment & Plan:   Problem List Items Addressed This Visit      Unprioritized   Preventative health care - Primary    ghm utd Check labs See AVS      Relevant Orders   POCT Urinalysis Dipstick (Automated) (Completed)    Other Visit Diagnoses    Encounter for Medicare annual wellness exam       Asymptomatic postmenopausal state       Relevant Orders   POCT Urinalysis Dipstick (Automated) (Completed)   Osteopenia, unspecified location       Relevant Orders   DG Bone Density   Comprehensive metabolic panel   Lipid panel   CBC with Differential/Platelet   Essential hypertension       Relevant Medications   hydrochlorothiazide (HYDRODIURIL) 25 MG tablet   Other Relevant Orders   Comprehensive metabolic panel   Lipid panel   CBC with Differential/Platelet   POCT Urinalysis Dipstick (Automated) (Completed)   Abnormal urine       Relevant Orders   Urine Culture   Hematuria, unspecified type       Relevant Orders   Urine Culture      I have discontinued Ms. [id number] calcium carbonate, Vitamin D, Potassium, and Fish Oil. I am also having her maintain her aspirin, ibuprofen, fluconazole, triamcinolone cream, Multiple Vitamins-Minerals (ONE DAILY MULTIVITAMIN WOMEN PO), and hydrochlorothiazide.  Meds ordered this encounter  Medications  . Multiple Vitamins-Minerals (ONE DAILY MULTIVITAMIN WOMEN PO)    Sig: Take 3 tablets by mouth 2 (two) times daily.  . hydrochlorothiazide (HYDRODIURIL) 25 MG tablet    Sig: TAKE 1 TABLET EVERY DAY  (SUBSTITUTED  FOR  HYDRODIURIL)    Dispense:  90 tablet    Refill:  3    CMA served as scribe during this visit. History, Physical and Plan performed by medical provider. Documentation and orders reviewed and attested to.   Ann Held, DO

## 2016-04-11 NOTE — Assessment & Plan Note (Signed)
ghm utd Check labs See AVS 

## 2016-04-12 LAB — CBC WITH DIFFERENTIAL/PLATELET
BASOS ABS: 0.1 10*3/uL (ref 0.0–0.1)
Basophils Relative: 1 % (ref 0.0–3.0)
EOS ABS: 0.2 10*3/uL (ref 0.0–0.7)
Eosinophils Relative: 2.5 % (ref 0.0–5.0)
HCT: 41 % (ref 36.0–46.0)
Hemoglobin: 14 g/dL (ref 12.0–15.0)
Lymphocytes Relative: 26.2 % (ref 12.0–46.0)
Lymphs Abs: 2.2 10*3/uL (ref 0.7–4.0)
MCHC: 34.2 g/dL (ref 30.0–36.0)
MCV: 89.8 fl (ref 78.0–100.0)
Monocytes Absolute: 0.7 10*3/uL (ref 0.1–1.0)
Monocytes Relative: 8 % (ref 3.0–12.0)
Neutro Abs: 5.3 10*3/uL (ref 1.4–7.7)
Neutrophils Relative %: 62.3 % (ref 43.0–77.0)
PLATELETS: 199 10*3/uL (ref 150.0–400.0)
RBC: 4.57 Mil/uL (ref 3.87–5.11)
RDW: 13.4 % (ref 11.5–15.5)
WBC: 8.5 10*3/uL (ref 4.0–10.5)

## 2016-04-12 LAB — LIPID PANEL
CHOL/HDL RATIO: 3
Cholesterol: 176 mg/dL (ref 0–200)
HDL: 52.2 mg/dL (ref >39.00)
LDL CALC: 97 mg/dL (ref 0–99)
NONHDL: 123.83
Triglycerides: 132 mg/dL (ref 0.0–149.0)
VLDL: 26.4 mg/dL (ref 0.0–40.0)

## 2016-04-12 LAB — URINE CULTURE

## 2016-04-12 LAB — COMPREHENSIVE METABOLIC PANEL
ALT: 22 U/L (ref 0–35)
AST: 19 U/L (ref 0–37)
Albumin: 3.8 g/dL (ref 3.5–5.2)
Alkaline Phosphatase: 71 U/L (ref 39–117)
BILIRUBIN TOTAL: 0.3 mg/dL (ref 0.2–1.2)
BUN: 13 mg/dL (ref 6–23)
CHLORIDE: 99 meq/L (ref 96–112)
CO2: 36 meq/L — AB (ref 19–32)
CREATININE: 0.65 mg/dL (ref 0.40–1.20)
Calcium: 9.1 mg/dL (ref 8.4–10.5)
GFR: 96.48 mL/min (ref >60.00)
GLUCOSE: 99 mg/dL (ref 70–99)
Potassium: 3.3 mEq/L — ABNORMAL LOW (ref 3.5–5.1)
SODIUM: 141 meq/L (ref 135–145)
Total Protein: 7.4 g/dL (ref 6.0–8.3)

## 2016-04-13 ENCOUNTER — Ambulatory Visit: Payer: Commercial Managed Care - HMO

## 2016-04-13 ENCOUNTER — Ambulatory Visit
Admission: RE | Admit: 2016-04-13 | Discharge: 2016-04-13 | Disposition: A | Discharge status: Home / Self Care | Payer: Medicare HMO | Source: Ambulatory Visit | Attending: Family Medicine | Admitting: Family Medicine

## 2016-04-13 DIAGNOSIS — M8580 Other specified disorders of bone density and structure, unspecified site: Secondary | ICD-10-CM

## 2016-04-13 DIAGNOSIS — Z1231 Encounter for screening mammogram for malignant neoplasm of breast: Secondary | ICD-10-CM | POA: Diagnosis not present

## 2016-04-13 DIAGNOSIS — M8589 Other specified disorders of bone density and structure, multiple sites: Secondary | ICD-10-CM | POA: Diagnosis not present

## 2016-04-13 DIAGNOSIS — Z780 Asymptomatic menopausal state: Secondary | ICD-10-CM | POA: Diagnosis not present

## 2016-04-18 ENCOUNTER — Other Ambulatory Visit: Payer: Self-pay | Admitting: Family Medicine

## 2016-04-18 DIAGNOSIS — E876 Hypokalemia: Secondary | ICD-10-CM

## 2016-05-23 ENCOUNTER — Other Ambulatory Visit (INDEPENDENT_AMBULATORY_CARE_PROVIDER_SITE_OTHER): Payer: Medicare HMO

## 2016-05-23 DIAGNOSIS — E876 Hypokalemia: Secondary | ICD-10-CM | POA: Diagnosis not present

## 2016-05-23 LAB — BASIC METABOLIC PANEL
BUN: 12 mg/dL (ref 6–23)
CO2: 31 mEq/L (ref 19–32)
Calcium: 9 mg/dL (ref 8.4–10.5)
Chloride: 101 mEq/L (ref 96–112)
Creatinine, Ser: 0.68 mg/dL (ref 0.40–1.20)
GFR: 91.55 mL/min (ref >60.00)
Glucose, Bld: 105 mg/dL — ABNORMAL HIGH (ref 70–99)
Potassium: 3.4 mEq/L — ABNORMAL LOW (ref 3.5–5.1)
Sodium: 140 mEq/L (ref 135–145)

## 2017-02-17 ENCOUNTER — Other Ambulatory Visit: Payer: Self-pay | Admitting: Family Medicine

## 2017-02-17 DIAGNOSIS — Z1231 Encounter for screening mammogram for malignant neoplasm of breast: Secondary | ICD-10-CM

## 2017-04-17 NOTE — Progress Notes (Signed)
Subjective:   Mindy Anderson is a 67 y.o. female who presents for Medicare Annual (Subsequent) preventive examination. Baby sits grandchildren a couple times per week.  Review of Systems: No ROS.  Medicare Wellness Visit. Additional risk factors are reflected in the social history. Cardiac Risk Factors include: advanced age (>38men, >23 women);hypertension;sedentary lifestyle   Sleep patterns:  No issues. 8 hrs nightly. Feels rested.  Home Safety/Smoke Alarms: Feels safe in home. Smoke alarms in place.   lives with husband. 1 story. Walk in shower. Ramp to enter.  Female:   Pap- last 10/17/13: no result on file.   Mammo- scheduled 04/24/17.       Dexa scan- Last 04/13/16-osteopenia        CCS- last 12/26/13: Recall 10 yrs Objective:     Vitals: BP (!) 144/86 (BP Location: Left Arm, Patient Position: Sitting, Cuff Size: Normal)   Pulse 76   Wt 203 lb 9.6 oz (92.4 kg)   SpO2 98%   BMI 34.14 kg/m   Body mass index is 34.14 kg/m.  Advanced Directives 04/21/2017 04/11/2016 06/19/2014 05/27/2014 12/26/2013 12/12/2013  Does Patient Have a Medical Advance Directive? Yes No No No No No  Does patient want to make changes to medical advance directive? No - Patient declined Yes (MAU/Ambulatory/Procedural Areas - Information given) - - - -  Would patient like information on creating a medical advance directive? - - Yes - Scientist, clinical (histocompatibility and immunogenetics) given - - -    Tobacco Social History   Tobacco Use  Smoking Status Never Smoker  Smokeless Tobacco Never Used     Counseling given: Not Answered   Clinical Intake: Pain : No/denies pain    Past Medical History:  Diagnosis Date  . Arthritis    hands, knees   . Complication of anesthesia   . Family history of adverse reaction to anesthesia    mother of pt. wakes slowly  . Hypertension   . PONV (postoperative nausea and vomiting)    Past Surgical History:  Procedure Laterality Date  . Band Aid surgery  1980's  . CHOLECYSTECTOMY N/A  06/25/2014   Procedure: LAPAROSCOPIC CHOLECYSTECTOMY;  Surgeon: Ralene Ok, MD;  Location: [facility] OR;  Service: General;  Laterality: N/A;  . TUBAL LIGATION     Family History  Problem Relation Age of Onset  . Uterine cancer Mother   . Heart disease Mother 44       chf  . Breast cancer Mother   . Cervical cancer Mother   . Cancer Mother        skin  . COPD Father   . Heart disease Father 8       MI  . Heart disease Brother        cabg with stents  . Cancer Brother        Skin cancer  . Lung cancer Sister   . Liver cancer Sister   . Breast cancer Sister   . Colon cancer Sister   . Colon polyps Daughter   . Rectal cancer Neg Hx   . Stomach cancer Neg Hx   . Esophageal cancer Neg Hx    Social History   Socioeconomic History  . Marital status: Married    Spouse name: None  . Number of children: None  . Years of education: None  . Highest education level: None  Social Needs  . Financial resource strain: None  . Food insecurity - worry: None  . Food insecurity - inability: None  .  Transportation needs - medical: None  . Transportation needs - non-medical: None  Occupational History  . Occupation: retired  Tobacco Use  . Smoking status: Never Smoker  . Smokeless tobacco: Never Used  Substance and Sexual Activity  . Alcohol use: No    Alcohol/week: 0.0 oz  . Drug use: No  . Sexual activity: No    Partners: Male  Other Topics Concern  . None  Social History Narrative   Exercise-- no    Outpatient Encounter Medications as of 04/21/2017  Medication Sig  . aspirin 81 MG tablet Take 81 mg by mouth daily.    . hydrochlorothiazide (HYDRODIURIL) 25 MG tablet TAKE 1 TABLET EVERY DAY  (SUBSTITUTED  FOR  HYDRODIURIL)  . ibuprofen (ADVIL,MOTRIN) 400 MG tablet Take 400 mg by mouth every 6 (six) hours as needed for mild pain.  . Multiple Vitamins-Minerals (ONE DAILY MULTIVITAMIN WOMEN PO) Take 3 tablets by mouth 2 (two) times daily.  Marland Kitchen triamcinolone cream (KENALOG) 0.1 %  Apply 1 application topically 2 (two) times daily as needed. (Patient not taking: Reported on 04/11/2016)  . [DISCONTINUED] fluconazole (DIFLUCAN) 150 MG tablet 1 tab by mouth today, may repeat in 3 days if symptoms persist   No facility-administered encounter medications on file as of 04/21/2017.     Activities of Daily Living In your present state of health, do you have any difficulty performing the following activities: 04/21/2017  Hearing? N  Vision? N  Comment wears glasses for reading.   Difficulty concentrating or making decisions? N  Walking or climbing stairs? N  Dressing or bathing? N  Doing errands, shopping? N  Preparing Food and eating ? N  Using the Toilet? N  In the past six months, have you accidently leaked urine? N  Do you have problems with loss of bowel control? N  Managing your Medications? N  Managing your Finances? N  Housekeeping or managing your Housekeeping? N  Some recent data might be hidden    Patient Care Team: Carollee Herter, Alferd Apa, DO as PCP - General Calvert Cantor, MD as Consulting Physician (Ophthalmology)    Assessment:   This is a routine wellness examination for Mindy Anderson. Physical assessment deferred to PCP.  Exercise Activities and Dietary recommendations Current Exercise Habits: The patient does not participate in regular exercise at present, Exercise limited by: None identified Diet (meal preparation, eat out, water intake, caffeinated beverages, dairy products, fruits and vegetables): well balanced   Goals    . Maintain healthy active lifestyle. (pt-stated)       Fall Risk Fall Risk  04/21/2017 04/11/2016 10/21/2014 10/21/2014 10/17/2013  Falls in the past year? Yes Yes No No No  Number falls in past yr: 2 or more 2 or more - - -  Injury with Fall? No No - - -  Follow up Education provided Education provided;Falls prevention discussed - - -    Depression Screen PHQ 2/9 Scores 04/21/2017 04/11/2016 10/21/2014 10/21/2014  PHQ - 2 Score 0 0 0 0      Cognitive Function MMSE - Mini Mental State Exam 04/21/2017 04/11/2016  Orientation to time 5 5  Orientation to Place 5 5  Registration 3 3  Attention/ Calculation 5 5  Recall 2 3  Language- name 2 objects 2 2  Language- repeat 1 1  Language- follow 3 step command 3 3  Language- read & follow direction 1 1  Write a sentence 1 1  Copy design 1 1  Total score 29 30  Immunization History  Administered Date(s) Administered  . Influenza Split 02/09/2011  . Influenza Whole 01/09/2008, 03/11/2009, 03/09/2010, 02/03/2012  . Influenza,inj,Quad PF,6+ Mos 01/15/2014, 01/09/2015  . Influenza-Unspecified 01/29/2016  . Pneumococcal Conjugate-13 10/21/2014  . Pneumococcal Polysaccharide-23 10/17/2013  . Td 02/11/2008  . Zoster 05/18/2009    Screening Tests Health Maintenance  Topic Date Due  . Hepatitis C Screening  1950-04-07  . TETANUS/TDAP  02/10/2018  . MAMMOGRAM  04/13/2018  . COLONOSCOPY  12/27/2023  . INFLUENZA VACCINE  Completed  . DEXA SCAN  Completed  . PNA vac Low Risk Adult  Completed      Plan:   Follow up with Dr.Lowne today as scheduled.  Continue to eat heart healthy diet (full of fruits, vegetables, whole grains, lean protein, water--limit salt, fat, and sugar intake) and increase physical activity as tolerated.  Continue doing brain stimulating activities (puzzles, reading, adult coloring books, staying active) to keep memory sharp.   Bring a copy of your living will and/or healthcare power of attorney to your next office visit.  I have personally reviewed and noted the following in the patient's chart:   . Medical and social history . Use of alcohol, tobacco or illicit drugs  . Current medications and supplements . Functional ability and status . Nutritional status . Physical activity . Advanced directives . List of other physicians . Hospitalizations, surgeries, and ER visits in previous 12 months . Vitals . Screenings to include  cognitive, depression, and falls . Referrals and appointments  In addition, I have reviewed and discussed with patient certain preventive protocols, quality metrics, and best practice recommendations. A written personalized care plan for preventive services as well as general preventive health recommendations were provided to patient.     Shela Nevin, [place]  04/21/2017

## 2017-04-21 ENCOUNTER — Ambulatory Visit: Payer: Medicare HMO | Admitting: *Deleted

## 2017-04-21 ENCOUNTER — Encounter: Payer: Self-pay | Admitting: Family Medicine

## 2017-04-21 ENCOUNTER — Ambulatory Visit (INDEPENDENT_AMBULATORY_CARE_PROVIDER_SITE_OTHER): Payer: Medicare HMO | Admitting: Family Medicine

## 2017-04-21 VITALS — BP 144/86 | HR 76 | Wt 203.6 lb

## 2017-04-21 DIAGNOSIS — E559 Vitamin D deficiency, unspecified: Secondary | ICD-10-CM

## 2017-04-21 DIAGNOSIS — Z0000 Encounter for general adult medical examination without abnormal findings: Secondary | ICD-10-CM | POA: Diagnosis not present

## 2017-04-21 DIAGNOSIS — I10 Essential (primary) hypertension: Secondary | ICD-10-CM

## 2017-04-21 NOTE — Progress Notes (Signed)
Subjective:     Mindy Anderson is a 67 y.o. female and is here for a comprehensive physical exam. The patient reports no problems.  Social History   Socioeconomic History  . Marital status: Married    Spouse name: Not on file  . Number of children: Not on file  . Years of education: Not on file  . Highest education level: Not on file  Social Needs  . Financial resource strain: Not on file  . Food insecurity - worry: Not on file  . Food insecurity - inability: Not on file  . Transportation needs - medical: Not on file  . Transportation needs - non-medical: Not on file  Occupational History  . Occupation: retired  Tobacco Use  . Smoking status: Never Smoker  . Smokeless tobacco: Never Used  Substance and Sexual Activity  . Alcohol use: No    Alcohol/week: 0.0 oz  . Drug use: No  . Sexual activity: No    Partners: Male  Other Topics Concern  . Not on file  Social History Narrative   Exercise-- no   Health Maintenance  Topic Date Due  . Hepatitis C Screening  [birth year]  . TETANUS/TDAP  02/10/2018  . MAMMOGRAM  04/13/2018  . COLONOSCOPY  12/27/2023  . INFLUENZA VACCINE  Completed  . DEXA SCAN  Completed  . PNA vac Low Risk Adult  Completed    The following portions of the patient's history were reviewed and updated as appropriate:  She  has a past medical history of Arthritis, Complication of anesthesia, Family history of adverse reaction to anesthesia, Hypertension, and PONV (postoperative nausea and vomiting). She does not have any pertinent problems on file. She  has a past surgical history that includes Band Aid surgery (1980's); Tubal ligation; and Cholecystectomy (N/A, 06/25/2014). Her family history includes Breast cancer in her mother and sister; COPD in her father; Cancer in her brother and mother; Cervical cancer in her mother; Colon cancer in her sister; Colon polyps in her daughter; Heart disease in her brother; Heart disease (age of onset: 17) in her father;  Heart disease (age of onset: 56) in her mother; Liver cancer in her sister; Lung cancer in her sister; Uterine cancer in her mother. She  reports that  has never smoked. she has never used smokeless tobacco. She reports that she does not drink alcohol or use drugs. She has a current medication list which includes the following prescription(s): aspirin, hydrochlorothiazide, ibuprofen, multiple vitamins-minerals, and triamcinolone cream. Current Outpatient Medications on File Prior to Visit  Medication Sig Dispense Refill  . aspirin 81 MG tablet Take 81 mg by mouth daily.      . hydrochlorothiazide (HYDRODIURIL) 25 MG tablet TAKE 1 TABLET EVERY DAY  (SUBSTITUTED  FOR  HYDRODIURIL) 90 tablet 3  . ibuprofen (ADVIL,MOTRIN) 400 MG tablet Take 400 mg by mouth every 6 (six) hours as needed for mild pain.    . Multiple Vitamins-Minerals (ONE DAILY MULTIVITAMIN WOMEN PO) Take 3 tablets by mouth 2 (two) times daily.    Marland Kitchen triamcinolone cream (KENALOG) 0.1 % Apply 1 application topically 2 (two) times daily as needed. (Patient not taking: Reported on 04/11/2016) 30 g 0   No current facility-administered medications on file prior to visit.    She is allergic to hydrocodone and tramadol..  Review of Systems Review of Systems  Constitutional: Negative for activity change, appetite change and fatigue.  HENT: Negative for hearing loss, congestion, tinnitus and ear discharge.  dentist q34m Eyes:  Negative for visual disturbance (see optho q1y -- vision corrected to 20/20 with glasses).  Respiratory: Negative for cough, chest tightness and shortness of breath.   Cardiovascular: Negative for chest pain, palpitations and leg swelling.  Gastrointestinal: Negative for abdominal pain, diarrhea, constipation and abdominal distention.  Genitourinary: Negative for urgency, frequency, decreased urine volume and difficulty urinating.  Musculoskeletal: Negative for back pain, arthralgias and gait problem.  Skin: Negative for  color change, pallor and rash.  Neurological: Negative for dizziness, light-headedness, numbness and headaches.  Hematological: Negative for adenopathy. Does not bruise/bleed easily.  Psychiatric/Behavioral: Negative for suicidal ideas, confusion, sleep disturbance, self-injury, dysphoric mood, decreased concentration and agitation.       Objective:    BP (!) 144/86 (BP Location: Left Arm, Patient Position: Sitting, Cuff Size: Normal)   Pulse 76   Wt 203 lb 9.6 oz (92.4 kg)   SpO2 98%   BMI 34.14 kg/m  General appearance: alert, cooperative, appears stated age and no distress Head: Normocephalic, without obvious abnormality, atraumatic Eyes: negative findings: lids and lashes normal, conjunctivae and sclerae normal and pupils equal, round, reactive to light and accomodation Ears: normal TM's and external ear canals both ears Nose: Nares normal. Septum midline. Mucosa normal. No drainage or sinus tenderness. Throat: lips, mucosa, and tongue normal; teeth and gums normal Neck: no adenopathy, no carotid bruit, no JVD, supple, symmetrical, trachea midline and thyroid not enlarged, symmetric, no tenderness/mass/nodules Back: symmetric, no curvature. ROM normal. No CVA tenderness. Lungs: clear to auscultation bilaterally Breasts: normal appearance, no masses or tenderness Heart: regular rate and rhythm, S1, S2 normal, no murmur, click, rub or gallop Abdomen: soft, non-tender; bowel sounds normal; no masses,  no organomegaly Pelvic: deferred Extremities: extremities normal, atraumatic, no cyanosis or edema Pulses: 2+ and symmetric Skin: Skin color, texture, turgor normal. No rashes or lesions Lymph nodes: Cervical, supraclavicular, and axillary nodes normal. Neurologic: Alert and oriented X 3, normal strength and tone. Normal symmetric reflexes. Normal coordination and gait    Assessment:    Healthy female exam.      Plan:     ghm utd Check labs See After Visit Summary for  Counseling Recommendations    1. Encounter for Medicare annual wellness exam See above  2. Vitamin D deficiency   - VITAMIN D 25 Hydroxy (Vit-D Deficiency, Fractures)  3. Essential hypertension Well controlled, no changes to meds. Encouraged heart healthy diet such as the DASH diet and exercise as tolerated.  - VITAMIN D 25 Hydroxy (Vit-D Deficiency, Fractures) - Lipid Profile - Comprehensive metabolic panel - CBC with Differential/Platelet  4. Preventative health care See above - Fecal occult blood, imunochemical; Future'

## 2017-04-21 NOTE — Patient Instructions (Signed)
Continue to eat heart healthy diet (full of fruits, vegetables, whole grains, lean protein, water--limit salt, fat, and sugar intake) and increase physical activity as tolerated.  Continue doing brain stimulating activities (puzzles, reading, adult coloring books, staying active) to keep memory sharp.   Bring a copy of your living will and/or healthcare power of attorney to your next office visit.   [name] , Thank you for taking time to come for your Medicare Wellness Visit. I appreciate your ongoing commitment to your health goals. Please review the following plan we discussed and let me know if I can assist you in the future.   These are the goals we discussed: Goals    . Maintain healthy active lifestyle. (pt-stated)       This is a list of the screening recommended for you and due dates:  Health Maintenance  Topic Date Due  .  Hepatitis C: One time screening is recommended by Center for Disease Control  (CDC) for  adults born from [birth year] through 1967.   12/22/1950  . Tetanus Vaccine  02/10/2018  . Mammogram  04/13/2018  . Colon Cancer Screening  12/27/2023  . Flu Shot  Completed  . DEXA scan (bone density measurement)  Completed  . Pneumonia vaccines  Completed    Health Maintenance for Postmenopausal Women Menopause is a normal process in which your reproductive ability comes to an end. This process happens gradually over a span of months to years, usually between the ages of 32 and 35. Menopause is complete when you have missed 12 consecutive menstrual periods. It is important to talk with your health care provider about some of the most common conditions that affect postmenopausal women, such as heart disease, cancer, and bone loss (osteoporosis). Adopting a healthy lifestyle and getting preventive care can help to promote your health and wellness. Those actions can also lower your chances of developing some of these common conditions. What should I know about  menopause? During menopause, you may experience a number of symptoms, such as:  Moderate-to-severe hot flashes.  Night sweats.  Decrease in sex drive.  Mood swings.  Headaches.  Tiredness.  Irritability.  Memory problems.  Insomnia.  Choosing to treat or not to treat menopausal changes is an individual decision that you make with your health care provider. What should I know about hormone replacement therapy and supplements? Hormone therapy products are effective for treating symptoms that are associated with menopause, such as hot flashes and night sweats. Hormone replacement carries certain risks, especially as you become older. If you are thinking about using estrogen or estrogen with progestin treatments, discuss the benefits and risks with your health care provider. What should I know about heart disease and stroke? Heart disease, heart attack, and stroke become more likely as you age. This may be due, in part, to the hormonal changes that your body experiences during menopause. These can affect how your body processes dietary fats, triglycerides, and cholesterol. Heart attack and stroke are both medical emergencies. There are many things that you can do to help prevent heart disease and stroke:  Have your blood pressure checked at least every 1-2 years. High blood pressure causes heart disease and increases the risk of stroke.  If you are 80-51 years old, ask your health care provider if you should take aspirin to prevent a heart attack or a stroke.  Do not use any tobacco products, including cigarettes, chewing tobacco, or electronic cigarettes. If you need help quitting, ask your health  care provider.  It is important to eat a healthy diet and maintain a healthy weight. ? Be sure to include plenty of vegetables, fruits, low-fat dairy products, and lean protein. ? Avoid eating foods that are high in solid fats, added sugars, or salt (sodium).  Get regular exercise. This  is one of the most important things that you can do for your health. ? Try to exercise for at least 150 minutes each week. The type of exercise that you do should increase your heart rate and make you sweat. This is known as moderate-intensity exercise. ? Try to do strengthening exercises at least twice each week. Do these in addition to the moderate-intensity exercise.  Know your numbers.Ask your health care provider to check your cholesterol and your blood glucose. Continue to have your blood tested as directed by your health care provider.  What should I know about cancer screening? There are several types of cancer. Take the following steps to reduce your risk and to catch any cancer development as early as possible. Breast Cancer  Practice breast self-awareness. ? This means understanding how your breasts normally appear and feel. ? It also means doing regular breast self-exams. Let your health care provider know about any changes, no matter how small.  If you are 34 or older, have a clinician do a breast exam (clinical breast exam or CBE) every year. Depending on your age, family history, and medical history, it may be recommended that you also have a yearly breast X-ray (mammogram).  If you have a family history of breast cancer, talk with your health care provider about genetic screening.  If you are at high risk for breast cancer, talk with your health care provider about having an MRI and a mammogram every year.  Breast cancer (BRCA) gene test is recommended for women who have family members with BRCA-related cancers. Results of the assessment will determine the need for genetic counseling and BRCA1 and for BRCA2 testing. BRCA-related cancers include these types: ? Breast. This occurs in males or females. ? Ovarian. ? Tubal. This may also be called fallopian tube cancer. ? Cancer of the abdominal or pelvic lining (peritoneal cancer). ? Prostate. ? Pancreatic.  Cervical,  Uterine, and Ovarian Cancer Your health care provider may recommend that you be screened regularly for cancer of the pelvic organs. These include your ovaries, uterus, and vagina. This screening involves a pelvic exam, which includes checking for microscopic changes to the surface of your cervix (Pap test).  For women ages 21-65, health care providers may recommend a pelvic exam and a Pap test every three years. For women ages 36-65, they may recommend the Pap test and pelvic exam, combined with testing for human papilloma virus (HPV), every five years. Some types of HPV increase your risk of cervical cancer. Testing for HPV may also be done on women of any age who have unclear Pap test results.  Other health care providers may not recommend any screening for nonpregnant women who are considered low risk for pelvic cancer and have no symptoms. Ask your health care provider if a screening pelvic exam is right for you.  If you have had past treatment for cervical cancer or a condition that could lead to cancer, you need Pap tests and screening for cancer for at least 20 years after your treatment. If Pap tests have been discontinued for you, your risk factors (such as having a new sexual partner) need to be reassessed to determine if you should  start having screenings again. Some women have medical problems that increase the chance of getting cervical cancer. In these cases, your health care provider may recommend that you have screening and Pap tests more often.  If you have a family history of uterine cancer or ovarian cancer, talk with your health care provider about genetic screening.  If you have vaginal bleeding after reaching menopause, tell your health care provider.  There are currently no reliable tests available to screen for ovarian cancer.  Lung Cancer Lung cancer screening is recommended for adults 50-35 years old who are at high risk for lung cancer because of a history of smoking. A  yearly low-dose CT scan of the lungs is recommended if you:  Currently smoke.  Have a history of at least 30 pack-years of smoking and you currently smoke or have quit within the past 15 years. A pack-year is smoking an average of one pack of cigarettes per day for one year.  Yearly screening should:  Continue until it has been 15 years since you quit.  Stop if you develop a health problem that would prevent you from having lung cancer treatment.  Colorectal Cancer  This type of cancer can be detected and can often be prevented.  Routine colorectal cancer screening usually begins at age 45 and continues through age 65.  If you have risk factors for colon cancer, your health care provider may recommend that you be screened at an earlier age.  If you have a family history of colorectal cancer, talk with your health care provider about genetic screening.  Your health care provider may also recommend using home test kits to check for hidden blood in your stool.  A small camera at the end of a tube can be used to examine your colon directly (sigmoidoscopy or colonoscopy). This is done to check for the earliest forms of colorectal cancer.  Direct examination of the colon should be repeated every 5-10 years until age 61. However, if early forms of precancerous polyps or small growths are found or if you have a family history or genetic risk for colorectal cancer, you may need to be screened more often.  Skin Cancer  Check your skin from head to toe regularly.  Monitor any moles. Be sure to tell your health care provider: ? About any new moles or changes in moles, especially if there is a change in a mole's shape or color. ? If you have a mole that is larger than the size of a pencil eraser.  If any of your family members has a history of skin cancer, especially at a young age, talk with your health care provider about genetic screening.  Always use sunscreen. Apply sunscreen liberally  and repeatedly throughout the day.  Whenever you are outside, protect yourself by wearing long sleeves, pants, a wide-brimmed hat, and sunglasses.  What should I know about osteoporosis? Osteoporosis is a condition in which bone destruction happens more quickly than new bone creation. After menopause, you may be at an increased risk for osteoporosis. To help prevent osteoporosis or the bone fractures that can happen because of osteoporosis, the following is recommended:  If you are 75-28 years old, get at least 1,000 mg of calcium and at least 600 mg of vitamin D per day.  If you are older than age 67 but younger than age 63, get at least 1,200 mg of calcium and at least 600 mg of vitamin D per day.  If you are older  than age 78, get at least 1,200 mg of calcium and at least 800 mg of vitamin D per day.  Smoking and excessive alcohol intake increase the risk of osteoporosis. Eat foods that are rich in calcium and vitamin D, and do weight-bearing exercises several times each week as directed by your health care provider. What should I know about how menopause affects my mental health? Depression may occur at any age, but it is more common as you become older. Common symptoms of depression include:  Low or sad mood.  Changes in sleep patterns.  Changes in appetite or eating patterns.  Feeling an overall lack of motivation or enjoyment of activities that you previously enjoyed.  Frequent crying spells.  Talk with your health care provider if you think that you are experiencing depression. What should I know about immunizations? It is important that you get and maintain your immunizations. These include:  Tetanus, diphtheria, and pertussis (Tdap) booster vaccine.  Influenza every year before the flu season begins.  Pneumonia vaccine.  Shingles vaccine.  Your health care provider may also recommend other immunizations. This information is not intended to replace advice given to you  by your health care provider. Make sure you discuss any questions you have with your health care provider. Document Released: 05/13/2005 Document Revised: 10/09/2015 Document Reviewed: 12/23/2014 Elsevier Interactive Patient Education  2018 [employer].

## 2017-04-22 ENCOUNTER — Encounter: Payer: Self-pay | Admitting: Family Medicine

## 2017-04-22 LAB — CBC WITH DIFFERENTIAL/PLATELET
Basophils Absolute: 50 cells/uL (ref 0–200)
Basophils Relative: 0.6 %
EOS ABS: 199 {cells}/uL (ref 15–500)
Eosinophils Relative: 2.4 %
HEMATOCRIT: 39.3 % (ref 35.0–45.0)
HEMOGLOBIN: 13.1 g/dL (ref 11.7–15.5)
LYMPHS ABS: 2390 {cells}/uL (ref 850–3900)
MCH: 29.3 pg (ref 27.0–33.0)
MCHC: 33.3 g/dL (ref 32.0–36.0)
MCV: 87.9 fL (ref 80.0–100.0)
MPV: 10.7 fL (ref 7.5–12.5)
Monocytes Relative: 7.3 %
NEUTROS ABS: 5055 {cells}/uL (ref 1500–7800)
Neutrophils Relative %: 60.9 %
PLATELETS: 242 10*3/uL (ref 140–400)
RBC: 4.47 10*6/uL (ref 3.80–5.10)
RDW: 12.2 % (ref 11.0–15.0)
Total Lymphocyte: 28.8 %
WBC: 8.3 10*3/uL (ref 3.8–10.8)
WBCMIX: 606 {cells}/uL (ref 200–950)

## 2017-04-22 LAB — COMPREHENSIVE METABOLIC PANEL
AG RATIO: 1.2 (calc) (ref 1.0–2.5)
ALKALINE PHOSPHATASE (APISO): 80 U/L (ref 33–130)
ALT: 15 U/L (ref 6–29)
AST: 17 U/L (ref 10–35)
Albumin: 4.1 g/dL (ref 3.6–5.1)
BILIRUBIN TOTAL: 0.4 mg/dL (ref 0.2–1.2)
BUN: 13 mg/dL (ref 7–25)
CALCIUM: 9.6 mg/dL (ref 8.6–10.4)
CHLORIDE: 103 mmol/L (ref 98–110)
CO2: 25 mmol/L (ref 20–32)
Creat: 0.7 mg/dL (ref 0.50–0.99)
GLOBULIN: 3.3 g/dL (ref 1.9–3.7)
GLUCOSE: 98 mg/dL (ref 65–99)
Potassium: 4.1 mmol/L (ref 3.5–5.3)
Sodium: 144 mmol/L (ref 135–146)
Total Protein: 7.4 g/dL (ref 6.1–8.1)

## 2017-04-22 LAB — LIPID PANEL
CHOLESTEROL: 169 mg/dL (ref <200)
HDL: 57 mg/dL (ref >50)
LDL CHOLESTEROL (CALC): 95 mg/dL
Non-HDL Cholesterol (Calc): 112 mg/dL (calc) (ref <130)
TRIGLYCERIDES: 82 mg/dL (ref <150)
Total CHOL/HDL Ratio: 3 (calc) (ref <5.0)

## 2017-04-22 LAB — VITAMIN D 25 HYDROXY (VIT D DEFICIENCY, FRACTURES): VIT D 25 HYDROXY: 25 ng/mL — AB (ref 30–100)

## 2017-04-24 ENCOUNTER — Ambulatory Visit
Admission: RE | Admit: 2017-04-24 | Discharge: 2017-04-24 | Disposition: A | Discharge status: Home / Self Care | Payer: Medicare HMO | Source: Ambulatory Visit | Attending: Family Medicine | Admitting: Family Medicine

## 2017-04-24 DIAGNOSIS — Z1231 Encounter for screening mammogram for malignant neoplasm of breast: Secondary | ICD-10-CM | POA: Diagnosis not present

## 2017-04-26 ENCOUNTER — Other Ambulatory Visit: Payer: Self-pay

## 2017-04-26 DIAGNOSIS — E559 Vitamin D deficiency, unspecified: Secondary | ICD-10-CM

## 2017-04-26 MED ORDER — VITAMIN D (ERGOCALCIFEROL) 1.25 MG (50000 UNIT) PO CAPS: 50000.0000 [IU] | ORAL_CAPSULE | ORAL | Status: AC

## 2017-05-03 ENCOUNTER — Other Ambulatory Visit: Payer: Self-pay | Admitting: Family Medicine

## 2017-05-03 DIAGNOSIS — I10 Essential (primary) hypertension: Secondary | ICD-10-CM

## 2017-07-04 ENCOUNTER — Encounter: Payer: Self-pay | Admitting: Family Medicine

## 2017-07-04 ENCOUNTER — Ambulatory Visit: Payer: Medicare HMO | Admitting: Family Medicine

## 2017-07-04 ENCOUNTER — Ambulatory Visit (HOSPITAL_BASED_OUTPATIENT_CLINIC_OR_DEPARTMENT_OTHER)
Admission: RE | Admit: 2017-07-04 | Discharge: 2017-07-04 | Disposition: A | Discharge status: Home / Self Care | Payer: Medicare HMO | Source: Ambulatory Visit | Attending: Family Medicine | Admitting: Family Medicine

## 2017-07-04 ENCOUNTER — Ambulatory Visit (INDEPENDENT_AMBULATORY_CARE_PROVIDER_SITE_OTHER): Payer: Medicare HMO | Admitting: Family Medicine

## 2017-07-04 VITALS — BP 138/88 | HR 77 | Temp 99.2°F | Resp 16

## 2017-07-04 DIAGNOSIS — S8991XA Unspecified injury of right lower leg, initial encounter: Secondary | ICD-10-CM | POA: Diagnosis not present

## 2017-07-04 DIAGNOSIS — M25561 Pain in right knee: Secondary | ICD-10-CM

## 2017-07-04 MED ORDER — KETOROLAC TROMETHAMINE 60 MG/2ML IM SOLN
60.0000 mg | Freq: Once | INTRAMUSCULAR | Status: AC
  Administered 2017-07-04: 60 mg via INTRAMUSCULAR

## 2017-07-04 NOTE — Progress Notes (Signed)
Patient ID: Mariabelen Pressly BJYNWGN, female   DOB: 09-12-1950, 67 y.o.   MRN: [id number]     Subjective:  I acted as a Education administrator for Dr. Carollee Herter.  Guerry Bruin, [place]   Patient ID: Kena Limon HQIONGE, female    DOB: Mar 30, 1951, 67 y.o.   MRN: [id number]  Chief Complaint  Patient presents with  . Knee Pain    right    Knee Pain   The incident occurred more than 1 week ago. The incident occurred at home. The injury mechanism was a fall. The pain is present in the right knee. Associated symptoms include an inability to bear weight, muscle weakness, numbness and tingling. The symptoms are aggravated by weight bearing and movement. She has tried NSAIDs and ice (ibuprofen) for the symptoms.    Patient is in today for right knee pain.   First fall was about 10 days ago and then last sat she was on her feet for a long time and it was swollen and painful.  Yesterday she was at her daughters house and se heard/ felt something pop in her r knee.  She now can not bend it   Patient Care Team: Carollee Herter, Alferd Apa, DO as PCP - General Calvert Cantor, MD as Consulting Physician (Ophthalmology)   Past Medical History:  Diagnosis Date  . Arthritis    hands, knees   . Complication of anesthesia   . Family history of adverse reaction to anesthesia    mother of pt. wakes slowly  . Hypertension   . PONV (postoperative nausea and vomiting)     Past Surgical History:  Procedure Laterality Date  . Band Aid surgery  1980's  . CHOLECYSTECTOMY N/A 06/25/2014   Procedure: LAPAROSCOPIC CHOLECYSTECTOMY;  Surgeon: Ralene Ok, MD;  Location: [facility] OR;  Service: General;  Laterality: N/A;  . TUBAL LIGATION      Family History  Problem Relation Age of Onset  . Uterine cancer Mother   . Heart disease Mother 81       chf  . Breast cancer Mother   . Cervical cancer Mother   . Cancer Mother        skin  . COPD Father   . Heart disease Father 82       MI  . Heart disease Brother        cabg with stents  . Cancer  Brother        Skin cancer  . Lung cancer Sister   . Liver cancer Sister   . Breast cancer Sister   . Colon cancer Sister   . Colon polyps Daughter   . Rectal cancer Neg Hx   . Stomach cancer Neg Hx   . Esophageal cancer Neg Hx     Social History   Socioeconomic History  . Marital status: Married    Spouse name: Not on file  . Number of children: Not on file  . Years of education: Not on file  . Highest education level: Not on file  Occupational History  . Occupation: retired  Scientific laboratory technician  . Financial resource strain: Not on file  . Food insecurity:    Worry: Not on file    Inability: Not on file  . Transportation needs:    Medical: Not on file    Non-medical: Not on file  Tobacco Use  . Smoking status: Never Smoker  . Smokeless tobacco: Never Used  Substance and Sexual Activity  . Alcohol use: No    Alcohol/week: 0.0  oz  . Drug use: No  . Sexual activity: Never    Partners: Male  Lifestyle  . Physical activity:    Days per week: Not on file    Minutes per session: Not on file  . Stress: Not on file  Relationships  . Social connections:    Talks on phone: Not on file    Gets together: Not on file    Attends religious service: Not on file    Active member of club or organization: Not on file    Attends meetings of clubs or organizations: Not on file    Relationship status: Not on file  . Intimate partner violence:    Fear of current or ex partner: Not on file    Emotionally abused: Not on file    Physically abused: Not on file    Forced sexual activity: Not on file  Other Topics Concern  . Not on file  Social History Narrative   Exercise-- no    Outpatient Medications Prior to Visit  Medication Sig Dispense Refill  . aspirin 81 MG tablet Take 81 mg by mouth daily.      . hydrochlorothiazide (HYDRODIURIL) 25 MG tablet TAKE 1 TABLET EVERY DAY  (SUBSTITUTED  FOR  HYDRODIURIL) 90 tablet 3  . ibuprofen (ADVIL,MOTRIN) 400 MG tablet Take 400 mg by mouth  every 6 (six) hours as needed for mild pain.    . Multiple Vitamins-Minerals (ONE DAILY MULTIVITAMIN WOMEN PO) Take 3 tablets by mouth 2 (two) times daily.    Marland Kitchen triamcinolone cream (KENALOG) 0.1 % Apply 1 application topically 2 (two) times daily as needed. (Patient not taking: Reported on 04/11/2016) 30 g 0   Facility-Administered Medications Prior to Visit  Medication Dose Route Frequency Provider Last Rate Last Dose  . Vitamin D (Ergocalciferol) (DRISDOL) capsule 50,000 Units  50,000 Units Oral Weekly Roma Schanz R, DO        Allergies  Allergen Reactions  . Hydrocodone Itching and Nausea And Vomiting  . Tramadol Nausea Only    Review of Systems  Constitutional: Negative for fever and malaise/fatigue.  HENT: Negative for congestion.   Eyes: Negative for blurred vision.  Respiratory: Negative for cough and shortness of breath.   Cardiovascular: Negative for chest pain, palpitations and leg swelling.  Gastrointestinal: Negative for vomiting.  Musculoskeletal: Positive for falls. Negative for back pain.  Skin: Negative for rash.  Neurological: Positive for tingling and numbness. Negative for loss of consciousness and headaches.       Objective:    Physical Exam  Constitutional: She is oriented to person, place, and time. She appears well-developed and well-nourished.  HENT:  Head: Normocephalic and atraumatic.  Eyes: Conjunctivae and EOM are normal.  Neck: Normal range of motion. Neck supple. No JVD present. Carotid bruit is not present. No thyromegaly present.  Cardiovascular: Normal rate, regular rhythm and normal heart sounds.  No murmur heard. Pulmonary/Chest: Effort normal and breath sounds normal. No respiratory distress. She has no wheezes. She has no rales. She exhibits no tenderness.  Musculoskeletal: She exhibits edema and tenderness.       Right knee: She exhibits decreased range of motion and swelling. Tenderness found. Patellar tendon tenderness noted.        Legs: Neurological: She is alert and oriented to person, place, and time.  Psychiatric: She has a normal mood and affect.  Nursing note and vitals reviewed.   BP 138/88 (BP Location: Right Arm, Cuff Size: Large)   Pulse  77   Temp 99.2 F (37.3 C) (Oral)   Resp 16   SpO2 95%  Wt Readings from Last 3 Encounters:  04/21/17 203 lb 9.6 oz (92.4 kg)  04/11/16 205 lb 12.8 oz (93.4 kg)  11/03/14 186 lb 6.4 oz (84.6 kg)   BP Readings from Last 3 Encounters:  07/04/17 138/88  04/21/17 (!) 144/86  04/11/16 138/86     Immunization History  Administered Date(s) Administered  . Influenza Split 02/09/2011  . Influenza Whole 01/09/2008, 03/11/2009, 03/09/2010, 02/03/2012  . Influenza,inj,Quad PF,6+ Mos 01/15/2014, 01/09/2015  . Influenza-Unspecified 01/29/2016  . Pneumococcal Conjugate-13 10/21/2014  . Pneumococcal Polysaccharide-23 10/17/2013  . Td 02/11/2008  . Zoster 05/18/2009    Health Maintenance  Topic Date Due  . Hepatitis C Screening  05-Feb-1951  . INFLUENZA VACCINE  11/02/2017  . TETANUS/TDAP  02/10/2018  . MAMMOGRAM  04/25/2019  . COLONOSCOPY  12/27/2023  . DEXA SCAN  Completed  . PNA vac Low Risk Adult  Completed    Lab Results  Component Value Date   WBC 8.3 04/21/2017   HGB 13.1 04/21/2017   HCT 39.3 04/21/2017   PLT 242 04/21/2017   GLUCOSE 98 04/21/2017   CHOL 169 04/21/2017   TRIG 82 04/21/2017   HDL 57 04/21/2017   LDLCALC 95 04/21/2017   ALT 15 04/21/2017   AST 17 04/21/2017   NA 144 04/21/2017   K 4.1 04/21/2017   CL 103 04/21/2017   CREATININE 0.70 04/21/2017   BUN 13 04/21/2017   CO2 25 04/21/2017   TSH 1.54 04/02/2012   MICROALBUR 0.8 04/02/2012    Lab Results  Component Value Date   TSH 1.54 04/02/2012   Lab Results  Component Value Date   WBC 8.3 04/21/2017   HGB 13.1 04/21/2017   HCT 39.3 04/21/2017   MCV 87.9 04/21/2017   PLT 242 04/21/2017   Lab Results  Component Value Date   NA 144 04/21/2017   K 4.1 04/21/2017   CO2  25 04/21/2017   GLUCOSE 98 04/21/2017   BUN 13 04/21/2017   CREATININE 0.70 04/21/2017   BILITOT 0.4 04/21/2017   ALKPHOS 71 04/11/2016   AST 17 04/21/2017   ALT 15 04/21/2017   PROT 7.4 04/21/2017   ALBUMIN 3.8 04/11/2016   CALCIUM 9.6 04/21/2017   ANIONGAP 9 06/19/2014   GFR 91.55 05/23/2016   Lab Results  Component Value Date   CHOL 169 04/21/2017   Lab Results  Component Value Date   HDL 57 04/21/2017   Lab Results  Component Value Date   LDLCALC 95 04/21/2017   Lab Results  Component Value Date   TRIG 82 04/21/2017   Lab Results  Component Value Date   CHOLHDL 3.0 04/21/2017   No results found for: HGBA1C       Assessment & Plan:   Problem List Items Addressed This Visit    None    Visit Diagnoses    Acute pain of right knee    -  Primary   Relevant Medications   ketorolac (TORADOL) injection 60 mg (Completed)   Other Relevant Orders   Ambulatory referral to Sports Medicine   DG Knee Complete 4 Views Right (Completed)    pt to see sports med today   I have discontinued Meliss Fleek. Bahri's triamcinolone cream. I am also having her maintain her aspirin, ibuprofen, Multiple Vitamins-Minerals (ONE DAILY MULTIVITAMIN WOMEN PO), and hydrochlorothiazide. We administered ketorolac. We will continue to administer Vitamin D (Ergocalciferol).  Meds  ordered this encounter  Medications  . ketorolac (TORADOL) injection 60 mg    CMA served as scribe during this visit. History, Physical and Plan performed by medical provider. Documentation and orders reviewed and attested to.  Ann Held, DO

## 2017-07-04 NOTE — Patient Instructions (Signed)

## 2017-07-04 NOTE — Patient Instructions (Signed)
You sprained you MCL (Grade 2), flared up your arthritis, and have a tear in your medial meniscus. Wear a hinged knee brace when up and walking around. Icing 15 minutes at a time 3-4 times a day. Aleve 2 tabs twice a day with food OR ibuprofen 600mg  three times a day with food for pain and inflammation - take regularly for 7-10 days then as needed. Let me know if you need something stronger for pain. Avoid twisting motions. Straight leg raises, knee extensions 3 sets of 10 once a day. Use a walker for stability to help you get around. Cortisone injection is an option but I'd rather let this heal without doing this if possible. Follow up with me in 2 weeks for reevaluation.

## 2017-07-05 ENCOUNTER — Encounter: Payer: Self-pay | Admitting: Family Medicine

## 2017-07-05 DIAGNOSIS — M25561 Pain in right knee: Secondary | ICD-10-CM | POA: Insufficient documentation

## 2017-07-05 NOTE — Progress Notes (Signed)
PCP and consultation requested by: Ann Held, DO  Subjective:   HPI: Patient is a 67 y.o. female here for right knee pain.  Patient reports about 1-1/2 weeks ago she excellently fell onto both of her knees. Since that time she has been elevating, taking ibuprofen. She states the pain was mild and improving. That she states she was at her son's house doing a lot on Sunday. Then yesterday morning she had use a cane because of pain in her anterior right knee. She then had an injury where she twisted and felt a pop within her right knee leading to swelling. She is continue to ice and take ibuprofen. Pain is a 5 out of 10 level. She has had problems with this knee previously and used a sleeve. No skin changes, numbness.  Past Medical History:  Diagnosis Date  . Arthritis    hands, knees   . Complication of anesthesia   . Family history of adverse reaction to anesthesia    mother of pt. wakes slowly  . Hypertension   . PONV (postoperative nausea and vomiting)     Current Outpatient Medications on File Prior to Visit  Medication Sig Dispense Refill  . aspirin 81 MG tablet Take 81 mg by mouth daily.      . hydrochlorothiazide (HYDRODIURIL) 25 MG tablet TAKE 1 TABLET EVERY DAY  (SUBSTITUTED  FOR  HYDRODIURIL) 90 tablet 3  . ibuprofen (ADVIL,MOTRIN) 400 MG tablet Take 400 mg by mouth every 6 (six) hours as needed for mild pain.    . Multiple Vitamins-Minerals (ONE DAILY MULTIVITAMIN WOMEN PO) Take 3 tablets by mouth 2 (two) times daily.     Current Facility-Administered Medications on File Prior to Visit  Medication Dose Route Frequency Provider Last Rate Last Dose  . Vitamin D (Ergocalciferol) (DRISDOL) capsule 50,000 Units  50,000 Units Oral Weekly Ann Held, DO        Past Surgical History:  Procedure Laterality Date  . Band Aid surgery  1980's  . CHOLECYSTECTOMY N/A 06/25/2014   Procedure: LAPAROSCOPIC CHOLECYSTECTOMY;  Surgeon: Ralene Ok, MD;   Location: [place] [place] [facility];  Service: General;  Laterality: N/A;  . TUBAL LIGATION      Allergies  Allergen Reactions  . Hydrocodone Itching and Nausea And Vomiting  . Tramadol Nausea Only    Social History   Socioeconomic History  . Marital status: Married    Spouse name: Not on file  . Number of children: Not on file  . Years of education: Not on file  . Highest education level: Not on file  Occupational History  . Occupation: retired  Scientific laboratory technician  . Financial resource strain: Not on file  . Food insecurity:    Worry: Not on file    Inability: Not on file  . Transportation needs:    Medical: Not on file    Non-medical: Not on file  Tobacco Use  . Smoking status: Never Smoker  . Smokeless tobacco: Never Used  Substance and Sexual Activity  . Alcohol use: No    Alcohol/week: 0.0 oz  . Drug use: No  . Sexual activity: Never    Partners: Male  Lifestyle  . Physical activity:    Days per week: Not on file    Minutes per session: Not on file  . Stress: Not on file  Relationships  . Social connections:    Talks on phone: Not on file    Gets together: Not on file  Attends religious service: Not on file    Active member of club or organization: Not on file    Attends meetings of clubs or organizations: Not on file    Relationship status: Not on file  . Intimate partner violence:    Fear of current or ex partner: Not on file    Emotionally abused: Not on file    Physically abused: Not on file    Forced sexual activity: Not on file  Other Topics Concern  . Not on file  Social History Narrative   Exercise-- no    Family History  Problem Relation Age of Onset  . Uterine cancer Mother   . Heart disease Mother 106       chf  . Breast cancer Mother   . Cervical cancer Mother   . Cancer Mother        skin  . COPD Father   . Heart disease Father 23       MI  . Heart disease Brother        cabg with stents  . Cancer Brother        Skin cancer  . Lung cancer Sister    . Liver cancer Sister   . Breast cancer Sister   . Colon cancer Sister   . Colon polyps Daughter   . Rectal cancer Neg Hx   . Stomach cancer Neg Hx   . Esophageal cancer Neg Hx     BP (!) 156/94   Pulse 96   Ht 5\' 5"  (1.651 m)   Wt 200 lb (90.7 kg)   BMI 33.28 kg/m   Review of Systems: See HPI above.     Objective:  Physical Exam:  Gen: NAD, comfortable in exam room  Right knee: Mod effusion.  No other gross deformity, ecchymoses. TTP medial joint line > post patellar facets. ROM 0 - 90 degrees with 5/5 strength. Negative ant/post drawers. Valgus testing with laxity, pain, and large shift.  Negative varus testing.  Negative lachmanns. Positive mcmurrays, apleys, negative patellar apprehension. NV intact distally.  Left knee: No deformity. FROM with 5/5 strength. No tenderness to palpation. NVI distally.   Assessment & Plan:  1. Right knee pain - history, exam consistent with combination of Grade 2 MCL sprain, flare of arthritis, and likely underlying medial meniscus tear.  She will wear a hinged knee brace.  Icing, aleve or ibuprofen for 7-10 days then as needed.  Avoid twisting motions.  Shown home exercises to start now.  Walker for stability.  F/u in 2 weeks.  We discussed with concurrent meniscus tear and arthritis if she doesn't improve with conservative measures arthroscopy is unlikely to provide her with benefit.

## 2017-07-05 NOTE — Assessment & Plan Note (Signed)
history, exam consistent with combination of Grade 2 MCL sprain, flare of arthritis, and likely underlying medial meniscus tear.  She will wear a hinged knee brace.  Icing, aleve or ibuprofen for 7-10 days then as needed.  Avoid twisting motions.  Shown home exercises to start now.  Walker for stability.  F/u in 2 weeks.  We discussed with concurrent meniscus tear and arthritis if she doesn't improve with conservative measures arthroscopy is unlikely to provide her with benefit.

## 2017-07-17 ENCOUNTER — Encounter: Payer: Self-pay | Admitting: Family Medicine

## 2017-07-17 ENCOUNTER — Ambulatory Visit: Payer: Medicare HMO | Admitting: Family Medicine

## 2017-07-17 DIAGNOSIS — M25561 Pain in right knee: Secondary | ICD-10-CM

## 2017-07-17 NOTE — Patient Instructions (Signed)
Use the knee brace when up and walking around for 2 more weeks.  Then it's up to you if you use for 2 additional weeks depending on your stability. Aleve, icing only if needed. Focus on knee extension and straight leg raise exercises 3 sets of 10 once a day. Add ankle weight if these become too easy. Follow up with me in 4 weeks or as needed.

## 2017-07-18 ENCOUNTER — Encounter: Payer: Self-pay | Admitting: Family Medicine

## 2017-07-18 NOTE — Assessment & Plan Note (Signed)
MCL sprain improved with only mild pain here now.  Still with some medial joint line tenderness but meniscus testing now negative.  Advised to use knee brace for 2 more weeks then potentially 2 more weeks beyond that depending on stability.  Aleve, icing only if needed.  HEP reviewed.  F/u in 4 weeks or prn.

## 2017-07-18 NOTE — Progress Notes (Signed)
PCP and consultation requested by: Ann Held, DO  Subjective:   HPI: Patient is a 67 y.o. female here for right knee pain.  4/2: Patient reports about 1-1/2 weeks ago she excellently fell onto both of her knees. Since that time she has been elevating, taking ibuprofen. She states the pain was mild and improving. That she states she was at her son's house doing a lot on Sunday. Then yesterday morning she had use a cane because of pain in her anterior right knee. She then had an injury where she twisted and felt a pop within her right knee leading to swelling. She is continue to ice and take ibuprofen. Pain is a 5 out of 10 level. She has had problems with this knee previously and used a sleeve. No skin changes, numbness.  4/15: Patient reports her right knee is doing better. Wearing knee brace. Swelling has improved. Pain level 0/10 level. Used walker initially, now cane at times for stability. Has been sleeping in recliner as more comfortable for her knee. Took aleve yesterday. No skin changes, numbness.  Past Medical History:  Diagnosis Date  . Arthritis    hands, knees   . Complication of anesthesia   . Family history of adverse reaction to anesthesia    mother of pt. wakes slowly  . Hypertension   . PONV (postoperative nausea and vomiting)     Current Outpatient Medications on File Prior to Visit  Medication Sig Dispense Refill  . aspirin 81 MG tablet Take 81 mg by mouth daily.      . hydrochlorothiazide (HYDRODIURIL) 25 MG tablet TAKE 1 TABLET EVERY DAY  (SUBSTITUTED  FOR  HYDRODIURIL) 90 tablet 3  . ibuprofen (ADVIL,MOTRIN) 400 MG tablet Take 400 mg by mouth every 6 (six) hours as needed for mild pain.    . Multiple Vitamins-Minerals (ONE DAILY MULTIVITAMIN WOMEN PO) Take 3 tablets by mouth 2 (two) times daily.     Current Facility-Administered Medications on File Prior to Visit  Medication Dose Route Frequency Provider Last Rate Last Dose  . Vitamin  D (Ergocalciferol) (DRISDOL) capsule 50,000 Units  50,000 Units Oral Weekly Ann Held, DO        Past Surgical History:  Procedure Laterality Date  . Band Aid surgery  1980's  . CHOLECYSTECTOMY N/A 06/25/2014   Procedure: LAPAROSCOPIC CHOLECYSTECTOMY;  Surgeon: Ralene Ok, MD;  Location: [place];  Service: General;  Laterality: N/A;  . TUBAL LIGATION      Allergies  Allergen Reactions  . Hydrocodone Itching and Nausea And Vomiting  . Tramadol Nausea Only    Social History   Socioeconomic History  . Marital status: Married    Spouse name: Not on file  . Number of children: Not on file  . Years of education: Not on file  . Highest education level: Not on file  Occupational History  . Occupation: retired  Scientific laboratory technician  . Financial resource strain: Not on file  . Food insecurity:    Worry: Not on file    Inability: Not on file  . Transportation needs:    Medical: Not on file    Non-medical: Not on file  Tobacco Use  . Smoking status: Never Smoker  . Smokeless tobacco: Never Used  Substance and Sexual Activity  . Alcohol use: No    Alcohol/week: 0.0 oz  . Drug use: No  . Sexual activity: Never    Partners: Male  Lifestyle  . Physical activity:  Days per week: Not on file    Minutes per session: Not on file  . Stress: Not on file  Relationships  . Social connections:    Talks on phone: Not on file    Gets together: Not on file    Attends religious service: Not on file    Active member of club or organization: Not on file    Attends meetings of clubs or organizations: Not on file    Relationship status: Not on file  . Intimate partner violence:    Fear of current or ex partner: Not on file    Emotionally abused: Not on file    Physically abused: Not on file    Forced sexual activity: Not on file  Other Topics Concern  . Not on file  Social History Narrative   Exercise-- no    Family History  Problem Relation Age of Onset  . Uterine cancer  Mother   . Heart disease Mother 47       chf  . Breast cancer Mother   . Cervical cancer Mother   . Cancer Mother        skin  . COPD Father   . Heart disease Father 10       MI  . Heart disease Brother        cabg with stents  . Cancer Brother        Skin cancer  . Lung cancer Sister   . Liver cancer Sister   . Breast cancer Sister   . Colon cancer Sister   . Colon polyps Daughter   . Rectal cancer Neg Hx   . Stomach cancer Neg Hx   . Esophageal cancer Neg Hx     BP (!) 172/95   Pulse 78   Ht 5\' 4"  (1.626 m)   Wt 200 lb (90.7 kg)   BMI 34.33 kg/m   Review of Systems: See HPI above.     Objective:  Physical Exam:  Gen: NAD, comfortable in exam room  Right knee: Minimal effusion.  No gross deformity, ecchymoses. Mild TTP medial joint line.  No other tenderness. FROM with 5/5 strength. Negative ant/post drawers. Negative valgus/varus testing without pain. Negative lachmanns. Negative mcmurrays, apleys, patellar apprehension. NV intact distally.   Assessment & Plan:  1. Right knee pain - MCL sprain improved with only mild pain here now.  Still with some medial joint line tenderness but meniscus testing now negative.  Advised to use knee brace for 2 more weeks then potentially 2 more weeks beyond that depending on stability.  Aleve, icing only if needed.  HEP reviewed.  F/u in 4 weeks or prn.

## 2017-07-26 ENCOUNTER — Other Ambulatory Visit: Payer: Medicare HMO

## 2017-08-17 ENCOUNTER — Other Ambulatory Visit (INDEPENDENT_AMBULATORY_CARE_PROVIDER_SITE_OTHER): Payer: Medicare HMO

## 2017-08-17 DIAGNOSIS — Z0000 Encounter for general adult medical examination without abnormal findings: Secondary | ICD-10-CM | POA: Diagnosis not present

## 2017-08-17 LAB — FECAL OCCULT BLOOD, IMMUNOCHEMICAL: Fecal Occult Bld: NEGATIVE

## 2017-08-18 ENCOUNTER — Telehealth: Payer: Self-pay | Admitting: Family Medicine

## 2017-08-18 NOTE — Telephone Encounter (Signed)
Copied from [id number]. Topic: Quick Communication - Lab Results >> Aug 17, 2017  3:05 PM Bartholome Bill, [place] wrote: Called patient to inform them of 08/17/17 lab results. When patient returns call, triage nurse may disclose results. >> Aug 17, 2017  3:48 PM Robina Ade, Cathyann D wrote: Patient called back to get her lab results given to her, please call patient back, thanks. >> Aug 18, 2017 11:03 AM Bea Graff, NT wrote: Pt calling back to get lab results.

## 2017-08-18 NOTE — Telephone Encounter (Signed)
See result notes. 

## 2017-11-28 NOTE — Progress Notes (Signed)
[facility] [address], [place], [place] [address] [phone]  Date:  11/29/2017   Name:  Mindy Anderson [id number]   DOB:  09/20/1950   MRN:  [id number]  PCP:  Mindy Held, DO    Chief Complaint: Sinus Problem (c/o problems with sinuses, discomfort on right side of neck x 3 months. )   History of Present Illness:  Mindy Anderson is a 67 y.o. very pleasant female patient who presents with the following:  Primary pt of Dr. Etter Anderson who is here today with concern of   She has history of HTN on HCTZ, vitamin D def I have not seen her in the past   She has noted "throat issues" for about 3 months. Seemed "like a soreness" on the right side of her neck.  Will be worse in the am She notes that she may be tender under the right side of her jaw but she does not feel any sort of mass No difficulty swallowing She does not notice any swelling She also has noted sinus congestion on the right side for a year.   It is constant She notices it most when she lays down at night  She is not sure if she might have a polyp in her nose No cough or fever She is otherwise feeling ok except for OA She did hurt her knee last year   Never a smoker and she does not chew tobacco She is not a drinker   Wt Readings from Last 3 Encounters:  11/29/17 200 lb 9.6 oz (91 kg)  07/17/17 200 lb (90.7 kg)  07/04/17 200 lb (90.7 kg)     Patient Active Problem List   Diagnosis Date Noted  . Right knee pain 07/05/2017  . Preventative health care 04/11/2016  . Vaginitis and vulvovaginitis 11/03/2014  . UNSPECIFIED VITAMIN D DEFICIENCY 05/13/2009  . ACTINIC KERATOSIS 02/11/2008  . POSTMENOPAUSAL STATUS 02/11/2008  . OSTEOARTHRITIS, KNEES, BILATERAL 09/27/2006  . HYPERTENSION 09/22/2006    Past Medical History:  Diagnosis Date  . Arthritis    hands, knees   . Complication of anesthesia   . Family history of adverse reaction to  anesthesia    mother of pt. wakes slowly  . Hypertension   . PONV (postoperative nausea and vomiting)     Past Surgical History:  Procedure Laterality Date  . Band Aid surgery  1980's  . CHOLECYSTECTOMY N/A 06/25/2014   Procedure: LAPAROSCOPIC CHOLECYSTECTOMY;  Surgeon: Ralene Ok, MD;  Location: [facility];  Service: General;  Laterality: N/A;  . TUBAL LIGATION      Social History   Tobacco Use  . Smoking status: Never Smoker  . Smokeless tobacco: Never Used  Substance Use Topics  . Alcohol use: No    Alcohol/week: 0.0 standard drinks  . Drug use: No    Family History  Problem Relation Age of Onset  . Uterine cancer Mother   . Heart disease Mother 75       chf  . Breast cancer Mother   . Cervical cancer Mother   . Cancer Mother        skin  . COPD Father   . Heart disease Father 45       MI  . Heart disease Brother        cabg with stents  . Cancer Brother        Skin cancer  . Lung cancer  Sister   . Liver cancer Sister   . Breast cancer Sister   . Colon cancer Sister   . Colon polyps Daughter   . Rectal cancer Neg Hx   . Stomach cancer Neg Hx   . Esophageal cancer Neg Hx     Allergies  Allergen Reactions  . Hydrocodone Itching and Nausea And Vomiting  . Tramadol Nausea Only    Medication list has been reviewed and updated.  Current Outpatient Medications on File Prior to Visit  Medication Sig Dispense Refill  . aspirin 81 MG tablet Take 81 mg by mouth daily.      . hydrochlorothiazide (HYDRODIURIL) 25 MG tablet TAKE 1 TABLET EVERY DAY  (SUBSTITUTED  FOR  HYDRODIURIL) 90 tablet 3  . ibuprofen (ADVIL,MOTRIN) 400 MG tablet Take 400 mg by mouth every 6 (six) hours as needed for mild pain.    . naproxen sodium (ALEVE) 220 MG tablet Take 220 mg by mouth daily as needed.     No current facility-administered medications on file prior to visit.     Review of Systems:  As per HPI- otherwise negative. Otherwise feels well except for her osteoarthritis  pain   Physical Examination: Vitals:   11/29/17 1212 11/29/17 1226  BP: (!) 152/72 (!) 142/92  Pulse:    Temp:    SpO2:     Vitals:   11/29/17 1209  Weight: 200 lb 9.6 oz (91 kg)  Height: 5\' 4"  (1.626 m)   Body mass index is 34.43 kg/m. Ideal Body Weight: Weight in (lb) to have BMI = 25: 145.3  GEN: WDWN, NAD, Non-toxic, A & O x 3, obese, looks well  HEENT: Atraumatic, Normocephalic. Neck supple. No masses, No LAD.  Bilateral TM wnl, oropharynx normal.  PEERL,EOMI.   On nasal speculum exam I do see a likely polyp in the right posterior nose Uvula displays a small ?polyp or mass on the left sided lateral aspect No mass or LAD noted in her neck or jaw Ears and Nose: No external deformity. CV: RRR, No M/G/R. No JVD. No thrill. No extra heart sounds. PULM: CTA B, no wheezes, crackles, rhonchi. No retractions. No resp. distress. No accessory muscle use. EXTR: No c/c/e NEURO Normal gait.  PSYCH: Normally interactive. Conversant. Not depressed or anxious appearing.  Calm demeanor.    Assessment and Plan: Nasal polyp - Plan: Ambulatory referral to ENT  Neoplasm of uvula  ENT referral for the concerns noted above- discussed getting a CT of her jaw for the sensation she has noted but she prefers to discuss with ENT first   Signed Lamar Blinks, MD

## 2017-11-29 ENCOUNTER — Encounter: Payer: Self-pay | Admitting: Family Medicine

## 2017-11-29 ENCOUNTER — Ambulatory Visit (INDEPENDENT_AMBULATORY_CARE_PROVIDER_SITE_OTHER): Payer: Medicare HMO | Admitting: Family Medicine

## 2017-11-29 VITALS — BP 142/92 | HR 78 | Temp 98.7°F | Ht 64.0 in | Wt 200.6 lb

## 2017-11-29 DIAGNOSIS — J339 Nasal polyp, unspecified: Secondary | ICD-10-CM

## 2017-11-29 DIAGNOSIS — D490 Neoplasm of unspecified behavior of digestive system: Secondary | ICD-10-CM | POA: Diagnosis not present

## 2017-11-29 NOTE — Patient Instructions (Signed)
I will get you set up to see [facility] on [address].  You can also call them yourself to schedule if you prefer I do think that you have a right sided nasal polyp Your uvula also appears to have a small growth on the left side.  ENT can help with both of these concerns!

## 2017-12-05 DIAGNOSIS — J339 Nasal polyp, unspecified: Secondary | ICD-10-CM | POA: Diagnosis not present

## 2017-12-05 DIAGNOSIS — R0981 Nasal congestion: Secondary | ICD-10-CM | POA: Diagnosis not present

## 2017-12-05 DIAGNOSIS — J029 Acute pharyngitis, unspecified: Secondary | ICD-10-CM | POA: Diagnosis not present

## 2017-12-13 DIAGNOSIS — R0989 Other specified symptoms and signs involving the circulatory and respiratory systems: Secondary | ICD-10-CM | POA: Diagnosis not present

## 2017-12-13 DIAGNOSIS — R0981 Nasal congestion: Secondary | ICD-10-CM | POA: Diagnosis not present

## 2017-12-13 DIAGNOSIS — J029 Acute pharyngitis, unspecified: Secondary | ICD-10-CM | POA: Diagnosis not present

## 2018-03-21 ENCOUNTER — Other Ambulatory Visit: Payer: Self-pay | Admitting: Family Medicine

## 2018-03-21 DIAGNOSIS — I10 Essential (primary) hypertension: Secondary | ICD-10-CM

## 2018-04-27 ENCOUNTER — Ambulatory Visit (INDEPENDENT_AMBULATORY_CARE_PROVIDER_SITE_OTHER): Payer: Medicare HMO | Admitting: Internal Medicine

## 2018-04-27 ENCOUNTER — Encounter: Payer: Self-pay | Admitting: Internal Medicine

## 2018-04-27 VITALS — BP 142/84 | HR 85 | Temp 98.5°F | Resp 16 | Ht 64.0 in | Wt 200.1 lb

## 2018-04-27 DIAGNOSIS — J029 Acute pharyngitis, unspecified: Secondary | ICD-10-CM | POA: Diagnosis not present

## 2018-04-27 LAB — POCT RAPID STREP A (OFFICE): Rapid Strep A Screen: NEGATIVE

## 2018-04-27 NOTE — Progress Notes (Signed)
Pre visit review using our clinic review tool, if applicable. No additional management support is needed unless otherwise documented below in the visit note. 

## 2018-04-27 NOTE — Patient Instructions (Signed)
If  cough:  Take Mucinex DM twice a day as needed until better  For nasal congestion: Use OTC   Flonase : 2 nasal sprays on each side of the nose in the morning     Call if not gradually better over the next  10 days  Call anytime if the symptoms are severe

## 2018-04-27 NOTE — Progress Notes (Signed)
Subjective:    Patient ID: Mindy Anderson, female    DOB: 1950-11-07, 68 y.o.   MRN: [id number]  DOS:  04/27/2018 Type of visit - description: acute She had mild right-sided sore throat for months, slightly worse in the last 2 days?.  She is also slightly hoarse and has to sneeze few times today.  She is concerned because she is going to babysit her grandchildren and does not like to have a strep and pass it to them.  Review of Systems Denies fever chills No sinus pain or congestion but from time to time she has some small amount of nasal discharge.  Mild cough for few days   Past Medical History:  Diagnosis Date  . Arthritis    hands, knees   . Complication of anesthesia   . Family history of adverse reaction to anesthesia    mother of pt. wakes slowly  . Hypertension   . PONV (postoperative nausea and vomiting)     Past Surgical History:  Procedure Laterality Date  . Band Aid surgery  1980's  . CHOLECYSTECTOMY N/A 06/25/2014   Procedure: LAPAROSCOPIC CHOLECYSTECTOMY;  Surgeon: Ralene Ok, MD;  Location: [facility];  Service: General;  Laterality: N/A;  . TUBAL LIGATION      Social History   Socioeconomic History  . Marital status: Married    Spouse name: Not on file  . Number of children: Not on file  . Years of education: Not on file  . Highest education level: Not on file  Occupational History  . Occupation: retired  Scientific laboratory technician  . Financial resource strain: Not on file  . Food insecurity:    Worry: Not on file    Inability: Not on file  . Transportation needs:    Medical: Not on file    Non-medical: Not on file  Tobacco Use  . Smoking status: Never Smoker  . Smokeless tobacco: Never Used  Substance and Sexual Activity  . Alcohol use: No    Alcohol/week: 0.0 standard drinks  . Drug use: No  . Sexual activity: Never    Partners: Male  Lifestyle  . Physical activity:    Days per week: Not on file    Minutes per session: Not on file  . Stress: Not on  file  Relationships  . Social connections:    Talks on phone: Not on file    Gets together: Not on file    Attends religious service: Not on file    Active member of club or organization: Not on file    Attends meetings of clubs or organizations: Not on file    Relationship status: Not on file  . Intimate partner violence:    Fear of current or ex partner: Not on file    Emotionally abused: Not on file    Physically abused: Not on file    Forced sexual activity: Not on file  Other Topics Concern  . Not on file  Social History Narrative   Exercise-- no      Allergies as of 04/27/2018      Reactions   Hydrocodone Itching, Nausea And Vomiting   Tramadol Nausea Only      Medication List       Accurate as of April 27, 2018 11:59 PM. Always use your most recent med list.        ALEVE 220 MG tablet Generic drug:  naproxen sodium Take 220 mg by mouth daily as needed.   aspirin 81  MG tablet Take 81 mg by mouth daily.   hydrochlorothiazide 25 MG tablet Commonly known as:  HYDRODIURIL TAKE 1 TABLET EVERY DAY  (SUBSTITUTED  FOR  HYDRODIURIL)   ibuprofen 400 MG tablet Commonly known as:  ADVIL,MOTRIN Take 400 mg by mouth every 6 (six) hours as needed for mild pain.   TURMERIC PO Take by mouth.           Objective:   Physical Exam BP (!) 142/84 (BP Location: Left Arm, Patient Position: Sitting, Cuff Size: Normal)   Pulse 85   Temp 98.5 F (36.9 C) (Oral)   Resp 16   Ht 5\' 4"  (1.626 m)   Wt 200 lb 2 oz (90.8 kg)   SpO2 97%   BMI 34.35 kg/m  General:   Well developed, NAD, BMI noted. HEENT:  Normocephalic . Face symmetric, atraumatic.  TMs normal, throat not red, no discharge, she has small lesion left from the uvula (previously seen and evaluated by ENT). Neck: No obvious lymphadenopathies or lumps. Lungs:  CTA B Normal respiratory effort, no intercostal retractions, no accessory muscle use. Heart: RRR,  no murmur.  No pretibial edema bilaterally    Skin: Not pale. Not jaundice Neurologic:  alert & oriented X3.  Speech normal, gait appropriate for age and unassisted Psych--  Cognition and judgment appear intact.  Cooperative with normal attention span and concentration.  Behavior appropriate. No anxious or depressed appearing.      Assessment     68 year old female, PMH includes hypertension, DJD, postmenopausal, presents with  Sore throat: The patient has sore throat for a while, slightly worse.  She is concerned about a strep pharyngitis. Strep test negative, exam is essentially benign, she does have uvular lesion previously seen by ENT few months ago and felt to be benign. Advised patient I do not believe she has a strep throat.  She does have some postnasal dripping, recommend Flonase consistently.  Call if not better.

## 2018-04-28 ENCOUNTER — Encounter: Payer: Self-pay | Admitting: Internal Medicine

## 2018-05-16 NOTE — Progress Notes (Signed)
Subjective:   Mindy Anderson is a 68 y.o. female who presents for Medicare Annual (Subsequent) preventive examination.  Review of Systems: No ROS.  Medicare Wellness Visit. Additional risk factors are reflected in the social history. Cardiac Risk Factors include: advanced age (>49men, >16 women);hypertension;sedentary lifestyle;obesity (BMI >30kg/m2) Sleep patterns: sleeps in recliner Home Safety/Smoke Alarms: Feels safe in home. Smoke alarms in place.  Lives with husband in 1 story home. Walk-in shower. Ramp entry.  Female:        Mammo- will schedule      Dexa scan-  Pt states she will schedule. ordered  CCS- next due 12/2023    Objective:     Vitals: BP (!) 150/88 (BP Location: Left Arm, Patient Position: Sitting, Cuff Size: Large) Comment: MD aware. will recheck in  appt directly following.  Pulse 78   Ht 5\' 4"  (1.626 m)   Wt 197 lb 3.2 oz (89.4 kg)   SpO2 98%   BMI 33.85 kg/m   Body mass index is 33.85 kg/m.  Advanced Directives 05/17/2018 04/21/2017 04/11/2016 06/19/2014 05/27/2014 12/26/2013 12/12/2013  Does Patient Have a Medical Advance Directive? No Yes No No No No No  Does patient want to make changes to medical advance directive? - No - Patient declined Yes (MAU/Ambulatory/Procedural Areas - Information given) - - - -  Would patient like information on creating a medical advance directive? No - Patient declined - - Yes - Scientist, clinical (histocompatibility and immunogenetics) given - - -    Tobacco Social History   Tobacco Use  Smoking Status Never Smoker  Smokeless Tobacco Never Used     Counseling given: Not Answered   Clinical Intake:     Pain : No/denies pain                 Past Medical History:  Diagnosis Date  . Arthritis    hands, knees   . Complication of anesthesia   . Family history of adverse reaction to anesthesia    mother of pt. wakes slowly  . Hypertension   . PONV (postoperative nausea and vomiting)    Past Surgical History:  Procedure Laterality Date  .  Band Aid surgery  1980's  . CHOLECYSTECTOMY N/A 06/25/2014   Procedure: LAPAROSCOPIC CHOLECYSTECTOMY;  Surgeon: Ralene Ok, MD;  Location: [facility] OR;  Service: General;  Laterality: N/A;  . TUBAL LIGATION     Family History  Problem Relation Age of Onset  . Uterine cancer Mother   . Heart disease Mother 10       chf  . Breast cancer Mother   . Cervical cancer Mother   . Cancer Mother        skin  . COPD Father   . Heart disease Father 50       MI  . Heart disease Brother        cabg with stents  . Cancer Brother        Skin cancer  . Heart disease Brother   . Lung cancer Sister   . Liver cancer Sister   . Breast cancer Sister   . Colon cancer Sister   . Colon polyps Daughter   . Rectal cancer Neg Hx   . Stomach cancer Neg Hx   . Esophageal cancer Neg Hx    Social History   Socioeconomic History  . Marital status: Married    Spouse name: Not on file  . Number of children: Not on file  . Years of education: Not on  file  . Highest education level: Not on file  Occupational History  . Occupation: retired  Scientific laboratory technician  . Financial resource strain: Not on file  . Food insecurity:    Worry: Not on file    Inability: Not on file  . Transportation needs:    Medical: Not on file    Non-medical: Not on file  Tobacco Use  . Smoking status: Never Smoker  . Smokeless tobacco: Never Used  Substance and Sexual Activity  . Alcohol use: No    Alcohol/week: 0.0 standard drinks  . Drug use: No  . Sexual activity: Never    Partners: Male  Lifestyle  . Physical activity:    Days per week: Not on file    Minutes per session: Not on file  . Stress: Not on file  Relationships  . Social connections:    Talks on phone: Not on file    Gets together: Not on file    Attends religious service: Not on file    Active member of club or organization: Not on file    Attends meetings of clubs or organizations: Not on file    Relationship status: Not on file  Other Topics Concern    . Not on file  Social History Narrative   Exercise-- no    Outpatient Encounter Medications as of 05/17/2018  Medication Sig  . aspirin 81 MG tablet Take 81 mg by mouth daily.    . Calcium Carbonate-Vit D-Min (CALCIUM 1200 PO) Take by mouth.  . Cholecalciferol (D3 HIGH POTENCY) 50 MCG (2000 UT) CAPS Take by mouth.  . Fluticasone Propionate (FLONASE NA) Place into the nose.  . hydrochlorothiazide (HYDRODIURIL) 25 MG tablet TAKE 1 TABLET EVERY DAY  (SUBSTITUTED  FOR  HYDRODIURIL)  . naproxen sodium (ALEVE) 220 MG tablet Take 220 mg by mouth daily as needed.  . TURMERIC PO Take by mouth.  Marland Kitchen ibuprofen (ADVIL,MOTRIN) 400 MG tablet Take 400 mg by mouth every 6 (six) hours as needed for mild pain.   No facility-administered encounter medications on file as of 05/17/2018.     Activities of Daily Living In your present state of health, do you have any difficulty performing the following activities: 05/17/2018 04/27/2018  Hearing? N N  Vision? N N  Difficulty concentrating or making decisions? N N  Walking or climbing stairs? N N  Dressing or bathing? N N  Doing errands, shopping? N N  Preparing Food and eating ? N -  Using the Toilet? N -  In the past six months, have you accidently leaked urine? N -  Do you have problems with loss of bowel control? N -  Managing your Medications? N -  Managing your Finances? N -  Housekeeping or managing your Housekeeping? N -  Some recent data might be hidden    Patient Care Team: Carollee Herter, Alferd Apa, DO as PCP - General Calvert Cantor, MD as Consulting Physician (Ophthalmology)    Assessment:   This is a routine wellness examination for Mindy Anderson. Physical assessment deferred to PCP.  Exercise Activities and Dietary recommendations Current Exercise Habits: The patient does not participate in regular exercise at present, Exercise limited by: None identified   Diet (meal preparation, eat out, water intake, caffeinated beverages, dairy products,  fruits and vegetables): 24 hr recall Breakfast: cinnamon toast Lunch: chicken salad and chips Dinner: hamburger and tater tots  Reports she needs to drink more water  Goals    . DIET - INCREASE WATER INTAKE    .  DIET - REDUCE SODIUM INTAKE    . Increase physical activity       Fall Risk Fall Risk  04/27/2018 04/21/2017 04/11/2016 10/21/2014 10/21/2014  Falls in the past year? 0 Yes Yes No No  Number falls in past yr: - 2 or more 2 or more - -  Injury with Fall? - No No - -  Follow up - Education provided Education provided;Falls prevention discussed - -   Depression Screen PHQ 2/9 Scores 05/17/2018 04/27/2018 04/21/2017 04/11/2016  PHQ - 2 Score 0 0 0 0     Cognitive Function MMSE - Mini Mental State Exam 05/17/2018 04/21/2017 04/11/2016  Orientation to time 5 5 5   Orientation to Place 5 5 5   Registration 3 3 3   Attention/ Calculation 4 5 5   Recall 3 2 3   Language- name 2 objects 2 2 2   Language- repeat 1 1 1   Language- follow 3 step command 3 3 3   Language- read & follow direction 1 1 1   Write a sentence 1 1 1   Copy design 1 1 1   Total score 29 29 30         Immunization History  Administered Date(s) Administered  . Influenza Inj Mdck Quad Pf 01/13/2018  . Influenza Split 02/09/2011  . Influenza Whole 01/09/2008, 03/11/2009, 03/09/2010, 02/03/2012  . Influenza,inj,Quad PF,6+ Mos 01/15/2014, 01/09/2015  . Influenza-Unspecified 01/29/2016  . Pneumococcal Conjugate-13 10/21/2014  . Pneumococcal Polysaccharide-23 10/17/2013  . Td 02/11/2008  . Zoster 05/18/2009  . Zoster Recombinat (Shingrix) 01/13/2018, 04/20/2018   Screening Tests Health Maintenance  Topic Date Due  . Hepatitis C Screening  08/11/1950  . TETANUS/TDAP  02/10/2018  . MAMMOGRAM  04/25/2019  . COLONOSCOPY  12/27/2023  . INFLUENZA VACCINE  Completed  . DEXA SCAN  Completed  . PNA vac Low Risk Adult  Completed       Plan:    Please schedule your next medicare wellness visit with me in 1 yr.  Eat heart  healthy diet (full of fruits, vegetables, whole grains, lean protein, water--limit salt, fat, and sugar intake) and increase physical activity as tolerated.  Continue doing brain stimulating activities (puzzles, reading, adult coloring books, staying active) to keep memory sharp.   I have ordered your bone density scan. Please schedule that and mammogram.  I have personally reviewed and noted the following in the patient's chart:   . Medical and social history . Use of alcohol, tobacco or illicit drugs  . Current medications and supplements . Functional ability and status . Nutritional status . Physical activity . Advanced directives . List of other physicians . Hospitalizations, surgeries, and ER visits in previous 12 months . Vitals . Screenings to include cognitive, depression, and falls . Referrals and appointments  In addition, I have reviewed and discussed with patient certain preventive protocols, quality metrics, and best practice recommendations. A written personalized care plan for preventive services as well as general preventive health recommendations were provided to patient.     Shela Nevin, [place]  05/17/2018

## 2018-05-17 ENCOUNTER — Other Ambulatory Visit: Payer: Self-pay | Admitting: Family Medicine

## 2018-05-17 ENCOUNTER — Encounter: Payer: Self-pay | Admitting: Family Medicine

## 2018-05-17 ENCOUNTER — Encounter: Payer: Self-pay | Admitting: *Deleted

## 2018-05-17 ENCOUNTER — Ambulatory Visit (INDEPENDENT_AMBULATORY_CARE_PROVIDER_SITE_OTHER): Payer: Medicare HMO | Admitting: *Deleted

## 2018-05-17 ENCOUNTER — Ambulatory Visit (HOSPITAL_BASED_OUTPATIENT_CLINIC_OR_DEPARTMENT_OTHER)
Admission: RE | Admit: 2018-05-17 | Discharge: 2018-05-17 | Disposition: A | Discharge status: Home / Self Care | Payer: Medicare HMO | Source: Ambulatory Visit | Attending: Family Medicine | Admitting: Family Medicine

## 2018-05-17 ENCOUNTER — Ambulatory Visit (INDEPENDENT_AMBULATORY_CARE_PROVIDER_SITE_OTHER): Payer: Medicare HMO | Admitting: Family Medicine

## 2018-05-17 VITALS — BP 150/88 | HR 78 | Ht 64.0 in | Wt 197.2 lb

## 2018-05-17 VITALS — BP 138/88 | HR 78 | Temp 98.4°F | Resp 16 | Ht 64.0 in | Wt 197.0 lb

## 2018-05-17 DIAGNOSIS — M25511 Pain in right shoulder: Secondary | ICD-10-CM

## 2018-05-17 DIAGNOSIS — G8929 Other chronic pain: Secondary | ICD-10-CM | POA: Diagnosis not present

## 2018-05-17 DIAGNOSIS — Z0000 Encounter for general adult medical examination without abnormal findings: Secondary | ICD-10-CM

## 2018-05-17 DIAGNOSIS — Z780 Asymptomatic menopausal state: Secondary | ICD-10-CM

## 2018-05-17 DIAGNOSIS — S4991XA Unspecified injury of right shoulder and upper arm, initial encounter: Secondary | ICD-10-CM | POA: Diagnosis not present

## 2018-05-17 DIAGNOSIS — Z1231 Encounter for screening mammogram for malignant neoplasm of breast: Secondary | ICD-10-CM

## 2018-05-17 DIAGNOSIS — J449 Chronic obstructive pulmonary disease, unspecified: Secondary | ICD-10-CM | POA: Diagnosis not present

## 2018-05-17 MED ORDER — PREDNISONE 10 MG PO TABS: ORAL_TABLET | ORAL | 0 refills | Status: DC

## 2018-05-17 NOTE — Patient Instructions (Signed)
Please schedule your next medicare wellness visit with me in 1 yr.  Eat heart healthy diet (full of fruits, vegetables, whole grains, lean protein, water--limit salt, fat, and sugar intake) and increase physical activity as tolerated.  Continue doing brain stimulating activities (puzzles, reading, adult coloring books, staying active) to keep memory sharp.   I have ordered your bone density scan. Please schedule that and mammogram.   [name] , Thank you for taking time to come for your Medicare Wellness Visit. I appreciate your ongoing commitment to your health goals. Please review the following plan we discussed and let me know if I can assist you in the future.   These are the goals we discussed: Goals    . DIET - INCREASE WATER INTAKE    . DIET - REDUCE SODIUM INTAKE    . Increase physical activity       This is a list of the screening recommended for you and due dates:  Health Maintenance  Topic Date Due  .  Hepatitis C: One time screening is recommended by Center for Disease Control  (CDC) for  adults born from [birth year] through 1967.   [birth year]  . Tetanus Vaccine  02/10/2018  . Mammogram  04/25/2019  . Colon Cancer Screening  12/27/2023  . Flu Shot  Completed  . DEXA scan (bone density measurement)  Completed  . Pneumonia vaccines  Completed    Health Maintenance After Age 44 After age 86, you are at a higher risk for certain long-term diseases and infections as well as injuries from falls. Falls are a major cause of broken bones and head injuries in people who are older than age 40. Getting regular preventive care can help to keep you healthy and well. Preventive care includes getting regular testing and making lifestyle changes as recommended by your health care provider. Talk with your health care provider about:  Which screenings and tests you should have. A screening is a test that checks for a disease when you have no symptoms.  A diet and exercise plan that is  right for you. What should I know about screenings and tests to prevent falls? Screening and testing are the best ways to find a health problem early. Early diagnosis and treatment give you the best chance of managing medical conditions that are common after age 96. Certain conditions and lifestyle choices may make you more likely to have a fall. Your health care provider may recommend:  Regular vision checks. Poor vision and conditions such as cataracts can make you more likely to have a fall. If you wear glasses, make sure to get your prescription updated if your vision changes.  Medicine review. Work with your health care provider to regularly review all of the medicines you are taking, including over-the-counter medicines. Ask your health care provider about any side effects that may make you more likely to have a fall. Tell your health care provider if any medicines that you take make you feel dizzy or sleepy.  Osteoporosis screening. Osteoporosis is a condition that causes the bones to get weaker. This can make the bones weak and cause them to break more easily.  Blood pressure screening. Blood pressure changes and medicines to control blood pressure can make you feel dizzy.  Strength and balance checks. Your health care provider may recommend certain tests to check your strength and balance while standing, walking, or changing positions.  Foot health exam. Foot pain and numbness, as well as not wearing proper footwear,  can make you more likely to have a fall.  Depression screening. You may be more likely to have a fall if you have a fear of falling, feel emotionally low, or feel unable to do activities that you used to do.  Alcohol use screening. Using too much alcohol can affect your balance and may make you more likely to have a fall. What actions can I take to lower my risk of falls? General instructions  Talk with your health care provider about your risks for falling. Tell your  health care provider if: ? You fall. Be sure to tell your health care provider about all falls, even ones that seem minor. ? You feel dizzy, sleepy, or off-balance.  Take over-the-counter and prescription medicines only as told by your health care provider. These include any supplements.  Eat a healthy diet and maintain a healthy weight. A healthy diet includes low-fat dairy products, low-fat (lean) meats, and fiber from whole grains, beans, and lots of fruits and vegetables. Home safety  Remove any tripping hazards, such as rugs, cords, and clutter.  Install safety equipment such as grab bars in bathrooms and safety rails on stairs.  Keep rooms and walkways well-lit. Activity   Follow a regular exercise program to stay fit. This will help you maintain your balance. Ask your health care provider what types of exercise are appropriate for you.  If you need a cane or walker, use it as recommended by your health care provider.  Wear supportive shoes that have nonskid soles. Lifestyle  Do not drink alcohol if your health care provider tells you not to drink.  If you drink alcohol, limit how much you have: ? 0-1 drink a day for women. ? 0-2 drinks a day for men.  Be aware of how much alcohol is in your drink. In the [place] one drink equals one typical bottle of beer (12 oz), one-half glass of wine (5 oz), or one shot of hard liquor (1 oz).  Do not use any products that contain nicotine or tobacco, such as cigarettes and e-cigarettes. If you need help quitting, ask your health care provider. Summary  Having a healthy lifestyle and getting preventive care can help to protect your health and wellness after age 66.  Screening and testing are the best way to find a health problem early and help you avoid having a fall. Early diagnosis and treatment give you the best chance for managing medical conditions that are more common for people who are older than age 74.  Falls are a major cause  of broken bones and head injuries in people who are older than age 12. Take precautions to prevent a fall at home.  Work with your health care provider to learn what changes you can make to improve your health and wellness and to prevent falls. This information is not intended to replace advice given to you by your health care provider. Make sure you discuss any questions you have with your health care provider. Document Released: 02/01/2017 Document Revised: 02/01/2017 Document Reviewed: 02/01/2017 Elsevier Interactive Patient Education  2019 [employer].

## 2018-05-17 NOTE — Patient Instructions (Signed)
Preventive Care 42 Years and Older, Female Preventive care refers to lifestyle choices and visits with your health care provider that can promote health and wellness. What does preventive care include?  A yearly physical exam. This is also called an annual well check.  Dental exams once or twice a year.  Routine eye exams. Ask your health care provider how often you should have your eyes checked.  Personal lifestyle choices, including: ? Daily care of your teeth and gums. ? Regular physical activity. ? Eating a healthy diet. ? Avoiding tobacco and drug use. ? Limiting alcohol use. ? Practicing safe sex. ? Taking low-dose aspirin every day. ? Taking vitamin and mineral supplements as recommended by your health care provider. What happens during an annual well check? The services and screenings done by your health care provider during your annual well check will depend on your age, overall health, lifestyle risk factors, and family history of disease. Counseling Your health care provider may ask you questions about your:  Alcohol use.  Tobacco use.  Drug use.  Emotional well-being.  Home and relationship well-being.  Sexual activity.  Eating habits.  History of falls.  Memory and ability to understand (cognition).  Work and work Statistician.  Reproductive health.  Screening You may have the following tests or measurements:  Height, weight, and BMI.  Blood pressure.  Lipid and cholesterol levels. These may be checked every 5 years, or more frequently if you are over 30 years old.  Skin check.  Lung cancer screening. You may have this screening every year starting at age 27 if you have a 30-pack-year history of smoking and currently smoke or have quit within the past 15 years.  Colorectal cancer screening. All adults should have this screening starting at age 33 and continuing until age 46. You will have tests every 1-10 years, depending on your results and the  type of screening test. People at increased risk should start screening at an earlier age. Screening tests may include: ? Guaiac-based fecal occult blood testing. ? Fecal immunochemical test (FIT). ? Stool DNA test. ? Virtual colonoscopy. ? Sigmoidoscopy. During this test, a flexible tube with a tiny camera (sigmoidoscope) is used to examine your rectum and lower colon. The sigmoidoscope is inserted through your anus into your rectum and lower colon. ? Colonoscopy. During this test, a long, thin, flexible tube with a tiny camera (colonoscope) is used to examine your entire colon and rectum.  Hepatitis C blood test.  Hepatitis B blood test.  Sexually transmitted disease (STD) testing.  Diabetes screening. This is done by checking your blood sugar (glucose) after you have not eaten for a while (fasting). You may have this done every 1-3 years.  Bone density scan. This is done to screen for osteoporosis. You may have this done starting at age 37.  Mammogram. This may be done every 1-2 years. Talk to your health care provider about how often you should have regular mammograms. Talk with your health care provider about your test results, treatment options, and if necessary, the need for more tests. Vaccines Your health care provider may recommend certain vaccines, such as:  Influenza vaccine. This is recommended every year.  Tetanus, diphtheria, and acellular pertussis (Tdap, Td) vaccine. You may need a Td booster every 10 years.  Varicella vaccine. You may need this if you have not been vaccinated.  Zoster vaccine. You may need this after age 38.  Measles, mumps, and rubella (MMR) vaccine. You may need at least  one dose of MMR if you were born in 1957 or later. You may also need a second dose.  Pneumococcal 13-valent conjugate (PCV13) vaccine. One dose is recommended after age 24.  Pneumococcal polysaccharide (PPSV23) vaccine. One dose is recommended after age 24.  Meningococcal  vaccine. You may need this if you have certain conditions.  Hepatitis A vaccine. You may need this if you have certain conditions or if you travel or work in places where you may be exposed to hepatitis A.  Hepatitis B vaccine. You may need this if you have certain conditions or if you travel or work in places where you may be exposed to hepatitis B.  Haemophilus influenzae type b (Hib) vaccine. You may need this if you have certain conditions. Talk to your health care provider about which screenings and vaccines you need and how often you need them. This information is not intended to replace advice given to you by your health care provider. Make sure you discuss any questions you have with your health care provider. Document Released: 04/17/2015 Document Revised: 05/11/2017 Document Reviewed: 01/20/2015 Elsevier Interactive Patient Education  2019 [employer].

## 2018-05-17 NOTE — Progress Notes (Signed)
Subjective:     Mindy Anderson is a 68 y.o. female and is here for a comprehensive physical exam. The patient reports pain in R shoulder -- she was playing with her grandchildren pulling them in the little car and hurt her shoulder.     Social History   Socioeconomic History  . Marital status: Married    Spouse name: Not on file  . Number of children: Not on file  . Years of education: Not on file  . Highest education level: Not on file  Occupational History  . Occupation: retired  Scientific laboratory technician  . Financial resource strain: Not on file  . Food insecurity:    Worry: Not on file    Inability: Not on file  . Transportation needs:    Medical: Not on file    Non-medical: Not on file  Tobacco Use  . Smoking status: Never Smoker  . Smokeless tobacco: Never Used  Substance and Sexual Activity  . Alcohol use: No    Alcohol/week: 0.0 standard drinks  . Drug use: No  . Sexual activity: Never    Partners: Male  Lifestyle  . Physical activity:    Days per week: Not on file    Minutes per session: Not on file  . Stress: Not on file  Relationships  . Social connections:    Talks on phone: Not on file    Gets together: Not on file    Attends religious service: Not on file    Active member of club or organization: Not on file    Attends meetings of clubs or organizations: Not on file    Relationship status: Not on file  . Intimate partner violence:    Fear of current or ex partner: Not on file    Emotionally abused: Not on file    Physically abused: Not on file    Forced sexual activity: Not on file  Other Topics Concern  . Not on file  Social History Narrative   Exercise-- no   Health Maintenance  Topic Date Due  . Hepatitis C Screening  1950-05-28  . TETANUS/TDAP  02/10/2018  . MAMMOGRAM  04/25/2019  . COLONOSCOPY  12/27/2023  . INFLUENZA VACCINE  Completed  . DEXA SCAN  Completed  . PNA vac Low Risk Adult  Completed    The following portions of the patient's  history were reviewed and updated as appropriate: She  has a past medical history of Arthritis, Complication of anesthesia, Family history of adverse reaction to anesthesia, Hypertension, and PONV (postoperative nausea and vomiting). She does not have any pertinent problems on file. She  has a past surgical history that includes Band Aid surgery (1980's); Tubal ligation; and Cholecystectomy (N/A, 06/25/2014). Her family history includes Breast cancer in her mother and sister; COPD in her father; Cancer in her brother and mother; Cervical cancer in her mother; Colon cancer in her sister; Colon polyps in her daughter; Heart attack (age of onset: 5) in her brother; Heart disease in her brother and brother; Heart disease (age of onset: 40) in her father; Heart disease (age of onset: 79) in her mother; Liver cancer in her sister; Lung cancer in her sister; Uterine cancer in her mother. She  reports that she has never smoked. She has never used smokeless tobacco. She reports that she does not drink alcohol or use drugs. She has a current medication list which includes the following prescription(s): aspirin, calcium carbonate-vit d-min, cholecalciferol, fluticasone propionate, hydrochlorothiazide, ibuprofen, naproxen sodium,  prednisone, and turmeric. Current Outpatient Medications on File Prior to Visit  Medication Sig Dispense Refill  . aspirin 81 MG tablet Take 81 mg by mouth daily.      . hydrochlorothiazide (HYDRODIURIL) 25 MG tablet TAKE 1 TABLET EVERY DAY  (SUBSTITUTED  FOR  HYDRODIURIL) 90 tablet 3  . ibuprofen (ADVIL,MOTRIN) 400 MG tablet Take 400 mg by mouth every 6 (six) hours as needed for mild pain.    . naproxen sodium (ALEVE) 220 MG tablet Take 220 mg by mouth daily as needed.    . TURMERIC PO Take by mouth.     No current facility-administered medications on file prior to visit.    She is allergic to hydrocodone and tramadol..  Review of Systems Review of Systems  Constitutional: Negative  for activity change, appetite change and fatigue.  HENT: Negative for hearing loss, congestion, tinnitus and ear discharge.  dentist q3m Eyes: Negative for visual disturbance (see optho q1y -- vision corrected to 20/20 with glasses).  Respiratory: Negative for cough, chest tightness and shortness of breath.   Cardiovascular: Negative for chest pain, palpitations and leg swelling.  Gastrointestinal: Negative for abdominal pain, diarrhea, constipation and abdominal distention.  Genitourinary: Negative for urgency, frequency, decreased urine volume and difficulty urinating.  Musculoskeletal: Negative for back pain, arthralgias and gait problem.  Skin: Negative for color change, pallor and rash.  Neurological: Negative for dizziness, light-headedness, numbness and headaches.  Hematological: Negative for adenopathy. Does not bruise/bleed easily.  Psychiatric/Behavioral: Negative for suicidal ideas, confusion, sleep disturbance, self-injury, dysphoric mood, decreased concentration and agitation.       Objective:    BP 138/88   Pulse 78   Temp 98.4 F (36.9 C) (Oral)   Resp 16   Ht 5\' 4"  (1.626 m)   Wt 197 lb (89.4 kg)   SpO2 98%   BMI 33.81 kg/m  General appearance: alert, cooperative, appears stated age and no distress Head: Normocephalic, without obvious abnormality, atraumatic Eyes: conjunctivae/corneas clear. PERRL, EOM's intact. Fundi benign. Ears: normal TM's and external ear canals both ears Nose: Nares normal. Septum midline. Mucosa normal. No drainage or sinus tenderness. Throat: lips, mucosa, and tongue normal; teeth and gums normal Neck: no adenopathy, no carotid bruit, no JVD, supple, symmetrical, trachea midline and thyroid not enlarged, symmetric, no tenderness/mass/nodules Back: symmetric, no curvature. ROM normal. No CVA tenderness. Lungs: clear to auscultation bilaterally Breasts: gyn Heart: regular rate and rhythm, S1, S2 normal, no murmur, click, rub or  gallop Abdomen: soft, non-tender; bowel sounds normal; no masses,  no organomegaly Pelvic: deferred Extremities: extremities normal, atraumatic, no cyanosis or edema Pulses: 2+ and symmetric Skin: Skin color, texture, turgor normal. No rashes or lesions Lymph nodes: Cervical, supraclavicular, and axillary nodes normal. Neurologic: Alert and oriented X 3, normal strength and tone. Normal symmetric reflexes. Normal coordination and gait    Assessment:    Healthy female exam.      Plan:    ghm utd Check labs See After Visit Summary for Counseling Recommendations    1. Chronic right shoulder pain Consider ortho  - DG Shoulder Right; Future - predniSONE (DELTASONE) 10 MG tablet; TAKE 3 TABLETS PO QD FOR 3 DAYS THEN TAKE 2 TABLETS PO QD FOR 3 DAYS THEN TAKE 1 TABLET PO QD FOR 3 DAYS THEN TAKE 1/2 TAB PO QD FOR 3 DAYS  Dispense: 20 tablet; Refill: 0  2. Preventative health care See above - CBC with Differential/Platelet - Lipid panel - Comprehensive metabolic panel

## 2018-05-17 NOTE — Progress Notes (Signed)
Reviewed  Yvonne R Lowne Chase, DO  

## 2018-05-18 ENCOUNTER — Other Ambulatory Visit (INDEPENDENT_AMBULATORY_CARE_PROVIDER_SITE_OTHER): Payer: Medicare HMO

## 2018-05-18 DIAGNOSIS — Z0000 Encounter for general adult medical examination without abnormal findings: Secondary | ICD-10-CM

## 2018-05-18 LAB — CBC WITH DIFFERENTIAL/PLATELET
BASOS ABS: 0 10*3/uL (ref 0.0–0.1)
Basophils Relative: 0.7 % (ref 0.0–3.0)
EOS ABS: 0.3 10*3/uL (ref 0.0–0.7)
Eosinophils Relative: 5.2 % — ABNORMAL HIGH (ref 0.0–5.0)
HEMATOCRIT: 43.1 % (ref 36.0–46.0)
HEMOGLOBIN: 14.3 g/dL (ref 12.0–15.0)
LYMPHS PCT: 29.6 % (ref 12.0–46.0)
Lymphs Abs: 1.7 10*3/uL (ref 0.7–4.0)
MCHC: 33.2 g/dL (ref 30.0–36.0)
MCV: 91.1 fl (ref 78.0–100.0)
MONO ABS: 0.5 10*3/uL (ref 0.1–1.0)
Monocytes Relative: 9 % (ref 3.0–12.0)
Neutro Abs: 3.3 10*3/uL (ref 1.4–7.7)
Neutrophils Relative %: 55.5 % (ref 43.0–77.0)
Platelets: 179 10*3/uL (ref 150.0–400.0)
RBC: 4.73 Mil/uL (ref 3.87–5.11)
RDW: 13.5 % (ref 11.5–15.5)
WBC: 5.9 10*3/uL (ref 4.0–10.5)

## 2018-05-18 LAB — COMPREHENSIVE METABOLIC PANEL
ALBUMIN: 3.9 g/dL (ref 3.5–5.2)
ALK PHOS: 74 U/L (ref 39–117)
ALT: 14 U/L (ref 0–35)
AST: 15 U/L (ref 0–37)
BILIRUBIN TOTAL: 0.7 mg/dL (ref 0.2–1.2)
BUN: 17 mg/dL (ref 6–23)
CALCIUM: 9.2 mg/dL (ref 8.4–10.5)
CHLORIDE: 103 meq/L (ref 96–112)
CO2: 33 mEq/L — ABNORMAL HIGH (ref 19–32)
Creatinine, Ser: 0.7 mg/dL (ref 0.40–1.20)
GFR: 82.82 mL/min (ref >60.00)
Glucose, Bld: 119 mg/dL — ABNORMAL HIGH (ref 70–99)
Potassium: 4.2 mEq/L (ref 3.5–5.1)
Sodium: 144 mEq/L (ref 135–145)
TOTAL PROTEIN: 6.9 g/dL (ref 6.0–8.3)

## 2018-05-18 LAB — LIPID PANEL
CHOLESTEROL: 162 mg/dL (ref 0–200)
HDL: 49.7 mg/dL (ref >39.00)
LDL CALC: 97 mg/dL (ref 0–99)
NonHDL: 111.91
TRIGLYCERIDES: 74 mg/dL (ref 0.0–149.0)
Total CHOL/HDL Ratio: 3
VLDL: 14.8 mg/dL (ref 0.0–40.0)

## 2018-05-18 NOTE — Addendum Note (Signed)
Addended by: Kelle Darting A on: 05/18/2018 07:45 AM   Modules accepted: Orders

## 2018-05-22 ENCOUNTER — Other Ambulatory Visit: Payer: Self-pay | Admitting: Family Medicine

## 2018-05-22 ENCOUNTER — Encounter: Payer: Self-pay | Admitting: *Deleted

## 2018-05-22 DIAGNOSIS — I10 Essential (primary) hypertension: Secondary | ICD-10-CM

## 2018-05-22 NOTE — Progress Notes (Signed)
Dr Carollee Herter:  Per most recent labs. Pt is supposed to return in 6 months to recheck lipids, cmet and cbc w/diff.  Will hypertension cover all or these or are there other dx codes we should use?

## 2018-05-22 NOTE — Progress Notes (Signed)
htn will cover it --- thank you

## 2018-06-19 ENCOUNTER — Ambulatory Visit: Payer: Medicare HMO

## 2018-06-19 ENCOUNTER — Other Ambulatory Visit: Payer: Medicare HMO

## 2018-12-03 ENCOUNTER — Ambulatory Visit: Payer: Self-pay | Admitting: *Deleted

## 2018-12-03 ENCOUNTER — Encounter: Payer: Self-pay | Admitting: Internal Medicine

## 2018-12-03 ENCOUNTER — Ambulatory Visit (INDEPENDENT_AMBULATORY_CARE_PROVIDER_SITE_OTHER): Payer: Medicare HMO | Admitting: Internal Medicine

## 2018-12-03 ENCOUNTER — Other Ambulatory Visit: Payer: Self-pay

## 2018-12-03 VITALS — BP 152/66 | HR 73 | Temp 96.8°F | Resp 16 | Ht 64.0 in | Wt 192.1 lb

## 2018-12-03 DIAGNOSIS — I10 Essential (primary) hypertension: Secondary | ICD-10-CM | POA: Diagnosis not present

## 2018-12-03 DIAGNOSIS — R6884 Jaw pain: Secondary | ICD-10-CM | POA: Diagnosis not present

## 2018-12-03 DIAGNOSIS — R5383 Other fatigue: Secondary | ICD-10-CM

## 2018-12-03 LAB — BASIC METABOLIC PANEL
BUN: 13 mg/dL (ref 6–23)
CO2: 34 mEq/L — ABNORMAL HIGH (ref 19–32)
Calcium: 9.5 mg/dL (ref 8.4–10.5)
Chloride: 100 mEq/L (ref 96–112)
Creatinine, Ser: 0.69 mg/dL (ref 0.40–1.20)
GFR: 84.07 mL/min (ref >60.00)
Glucose, Bld: 97 mg/dL (ref 70–99)
Potassium: 3.7 mEq/L (ref 3.5–5.1)
Sodium: 142 mEq/L (ref 135–145)

## 2018-12-03 LAB — CBC WITH DIFFERENTIAL/PLATELET
Basophils Absolute: 0 10*3/uL (ref 0.0–0.1)
Basophils Relative: 0.5 % (ref 0.0–3.0)
Eosinophils Absolute: 0.2 10*3/uL (ref 0.0–0.7)
Eosinophils Relative: 2.1 % (ref 0.0–5.0)
HCT: 44 % (ref 36.0–46.0)
Hemoglobin: 14.5 g/dL (ref 12.0–15.0)
Lymphocytes Relative: 24 % (ref 12.0–46.0)
Lymphs Abs: 2 10*3/uL (ref 0.7–4.0)
MCHC: 32.9 g/dL (ref 30.0–36.0)
MCV: 91.9 fl (ref 78.0–100.0)
Monocytes Absolute: 0.6 10*3/uL (ref 0.1–1.0)
Monocytes Relative: 7.7 % (ref 3.0–12.0)
Neutro Abs: 5.4 10*3/uL (ref 1.4–7.7)
Neutrophils Relative %: 65.7 % (ref 43.0–77.0)
Platelets: 203 10*3/uL (ref 150.0–400.0)
RBC: 4.79 Mil/uL (ref 3.87–5.11)
RDW: 13.5 % (ref 11.5–15.5)
WBC: 8.2 10*3/uL (ref 4.0–10.5)

## 2018-12-03 LAB — SEDIMENTATION RATE: Sed Rate: 6 mm/hr (ref 0–30)

## 2018-12-03 MED ORDER — AMLODIPINE BESYLATE 5 MG PO TABS: 5.0000 mg | ORAL_TABLET | Freq: Every day | ORAL | 6 refills | Status: DC

## 2018-12-03 NOTE — Progress Notes (Signed)
Pre visit review using our clinic review tool, if applicable. No additional management support is needed unless otherwise documented below in the visit note. 

## 2018-12-03 NOTE — Telephone Encounter (Signed)
Patient is having elevated BP- 163/94, slight headache that comes and goes. Patient thought she got overheated on Wednesday- 160/99- she has been checking it daily-160/92,151/104,154/87. Patient has been having other body pains-patient also had body pain, jaw and shoulder pain. Patient had been taking aspirin since Wednesday and reports her symptoms are decreased- she refused ED/UC due to COVID concerns and only wants office appointment. Call to office for appointment.  Reason for Disposition . Systolic BP  >= 99991111 OR Diastolic >= [id number]  Answer Assessment - Initial Assessment Questions 1. BLOOD PRESSURE: "What is the blood pressure?" "Did you take at least two measurements 5 minutes apart?"     163/94, 181/95 P 88 2. ONSET: "When did you take your blood pressure?"     7:00 3. HOW: "How did you obtain the blood pressure?" (e.g., visiting nurse, automatic home BP monitor)     Manual cuff 4. HISTORY: "Do you have a history of high blood pressure?"     yes 5. MEDICATIONS: "Are you taking any medications for blood pressure?" "Have you missed any doses recently?"     Yes- HCTZ 6. OTHER SYMPTOMS: "Do you have any symptoms?" (e.g., headache, chest pain, blurred vision, difficulty breathing, weakness)     Slight jaw pain, slight headache on R 7. PREGNANCY: "Is there any chance you are pregnant?" "When was your last menstrual period?"     n/a  Protocols used: HIGH BLOOD PRESSURE-A-AH

## 2018-12-03 NOTE — Telephone Encounter (Signed)
Appt scheduled

## 2018-12-03 NOTE — Patient Instructions (Signed)
GO TO THE LAB : Get the blood work     GO TO THE FRONT DESK Schedule your next appointment   to see your primary doctor in 4 weeks  Add amlodipine 5 mg: 1 tablet daily for BP control.   Check the  blood pressure 2 or 3 times a week BP GOAL is between 110/65 and  135/85. If it is consistently higher or lower, let me know

## 2018-12-03 NOTE — Progress Notes (Signed)
Subjective:    Patient ID: Mindy Anderson, female    DOB: [birth year], 68 y.o.   MRN: [id number]  DOS:  12/03/2018 Type of visit - description: Acute 5 days ago, she woke up and fell a right jaw pain, pain did not change with exertion or with chewing. That day she also had some ill-defined discomfort at the right side of the neck & right shoulder blade. Since then, symptoms are steady but now are gradually resolving.  After she woke up, 5 days ago she did some yard work, it was a very hot day. After that she fell exhausted, "I was not feeling right". On further questioning she denies chest pain, difficulty breathing, palpitations. No nausea or vomiting No cough She had a mild headache.  Since then, she is gradually getting better and is close to baseline now.  Because of all of the above she started to check her blood pressures and he has been in the high side. 160/99, 145/82, today 163/94 she recheck at home and it was 181/95.   BP Readings from Last 3 Encounters:  12/03/18 (!) 152/66  05/17/18 (!) 150/88  05/17/18 138/88    Review of Systems  As above Denies any dental type of pain. Not aware of any swelling in the neck  Past Medical History:  Diagnosis Date  . Arthritis    hands, knees   . Complication of anesthesia   . Family history of adverse reaction to anesthesia    mother of pt. wakes slowly  . Hypertension   . PONV (postoperative nausea and vomiting)     Past Surgical History:  Procedure Laterality Date  . Band Aid surgery  1980's  . CHOLECYSTECTOMY N/A 06/25/2014   Procedure: LAPAROSCOPIC CHOLECYSTECTOMY;  Surgeon: Ralene Ok, MD;  Location: [place];  Service: General;  Laterality: N/A;  . TUBAL LIGATION      Social History   Socioeconomic History  . Marital status: Married    Spouse name: Not on file  . Number of children: Not on file  . Years of education: Not on file  . Highest education level: Not on file  Occupational History  .  Occupation: retired  Scientific laboratory technician  . Financial resource strain: Not on file  . Food insecurity    Worry: Not on file    Inability: Not on file  . Transportation needs    Medical: Not on file    Non-medical: Not on file  Tobacco Use  . Smoking status: Never Smoker  . Smokeless tobacco: Never Used  Substance and Sexual Activity  . Alcohol use: No    Alcohol/week: 0.0 standard drinks  . Drug use: No  . Sexual activity: Never    Partners: Male  Lifestyle  . Physical activity    Days per week: Not on file    Minutes per session: Not on file  . Stress: Not on file  Relationships  . Social Herbalist on phone: Not on file    Gets together: Not on file    Attends religious service: Not on file    Active member of club or organization: Not on file    Attends meetings of clubs or organizations: Not on file    Relationship status: Not on file  . Intimate partner violence    Fear of current or ex partner: Not on file    Emotionally abused: Not on file    Physically abused: Not on file    Forced  sexual activity: Not on file  Other Topics Concern  . Not on file  Social History Narrative   Exercise-- no      Allergies as of 12/03/2018      Reactions   Hydrocodone Itching, Nausea And Vomiting   Tramadol Nausea Only      Medication List       Accurate as of December 03, 2018  4:59 PM. If you have any questions, ask your nurse or doctor.        STOP taking these medications   predniSONE 10 MG tablet Commonly known as: DELTASONE Stopped by: Kathlene November, MD     TAKE these medications   Aleve 220 MG tablet Generic drug: naproxen sodium Take 220 mg by mouth daily as needed.   amLODipine 5 MG tablet Commonly known as: NORVASC Take 1 tablet (5 mg total) by mouth daily. Started by: Kathlene November, MD   aspirin 81 MG tablet Take 81 mg by mouth daily.   CALCIUM 1200 PO Take by mouth.   D3 High Potency 50 MCG (2000 UT) Caps Generic drug: Cholecalciferol Take by  mouth.   FLONASE NA Place into the nose.   hydrochlorothiazide 25 MG tablet Commonly known as: HYDRODIURIL TAKE 1 TABLET EVERY DAY  (SUBSTITUTED  FOR  HYDRODIURIL)   ibuprofen 400 MG tablet Commonly known as: ADVIL Take 400 mg by mouth every 6 (six) hours as needed for mild pain.   TURMERIC PO Take by mouth.           Objective:   Physical Exam BP (!) 152/66 (BP Location: Left Arm, Patient Position: Sitting, Cuff Size: Normal)   Pulse 73   Temp (!) 96.8 F (36 C) (Temporal)   Resp 16   Ht 5\' 4"  (1.626 m)   Wt 192 lb 2 oz (87.1 kg)   SpO2 96%   BMI 32.98 kg/m  General:   Well developed, NAD, BMI noted. HEENT:  Normocephalic . Face symmetric, atraumatic. Neck: Symmetric, no TTP Throat and mouth, symmetric, no obvious oral/gum abscess or dental infection Neck: No TTP at the cervical spine Lungs:  CTA B Normal respiratory effort, no intercostal retractions, no accessory muscle use. Heart: RRR,  no murmur.  No pretibial edema bilaterally  Skin: Not pale. Not jaundice Neurologic:  alert & oriented X3.  Speech normal, gait appropriate for age and unassisted Psych--  Cognition and judgment appear intact.  Cooperative with normal attention span and concentration.  Behavior appropriate. No anxious or depressed appearing.      Assessment    68 year old female, PMH includes hypertension, DJD, postmenopausal, presents with  Jaw pain: Right-sided, atypical for CAD equivalent.  No jaw claudication.  No dental infection clinical grounds.  Will check CBC and sed rate Fatigue : Felt exhausted after she did yard work outdoors on a very hot day.  While I encouraged her to be active, recommend to avoid very hot environments and drink plenty of water. HTN: EKG today: NSR.  No acute changes We will check a BMP Needs better control, add amlodipine 5 mg, see PCP in 1 month.  Continue monitoring BPs, watch for low BP Follow-up by PCP in 4 weeks

## 2018-12-20 ENCOUNTER — Other Ambulatory Visit: Payer: Self-pay | Admitting: Family Medicine

## 2018-12-20 MED ORDER — AMLODIPINE BESYLATE 5 MG PO TABS: 5.0000 mg | ORAL_TABLET | Freq: Every day | ORAL | 6 refills | Status: DC

## 2018-12-20 NOTE — Telephone Encounter (Signed)
Medication Refill - Medication: amLODipine (NORVASC) 5 MG tablet    Has the patient contacted their pharmacy? Yes.   (Agent: If no, request that the patient contact the pharmacy for the refill.) (Agent: If yes, when and what did the pharmacy advise?)  Preferred Pharmacy (with phone number or street name):  [facility] ([address]), [place] [id number] (Phone) [phone] (Fax)     Agent: Please be advised that RX refills may take up to 3 business days. We ask that you follow-up with your pharmacy.

## 2018-12-25 MED ORDER — AMLODIPINE BESYLATE 5 MG PO TABS: 5.0000 mg | ORAL_TABLET | Freq: Every day | ORAL | 1 refills | Status: DC

## 2018-12-25 NOTE — Addendum Note (Signed)
Addended by: Dimple Nanas on: 12/25/2018 04:43 PM   Modules accepted: Orders

## 2018-12-25 NOTE — Telephone Encounter (Signed)
Patient request Rx for amLODipine (NORVASC) 5 MG tablet  To be sent to her [facility] mail order pharmacy for a 90 day supply ASAP please she will not pick up Rx from [place].

## 2018-12-28 ENCOUNTER — Other Ambulatory Visit: Payer: Self-pay

## 2018-12-31 ENCOUNTER — Encounter: Payer: Self-pay | Admitting: Family Medicine

## 2018-12-31 ENCOUNTER — Other Ambulatory Visit: Payer: Self-pay

## 2018-12-31 ENCOUNTER — Encounter: Payer: Self-pay | Admitting: Gastroenterology

## 2018-12-31 ENCOUNTER — Ambulatory Visit (INDEPENDENT_AMBULATORY_CARE_PROVIDER_SITE_OTHER): Payer: Medicare HMO | Admitting: Family Medicine

## 2018-12-31 VITALS — BP 140/84 | HR 80 | Temp 97.7°F | Resp 18 | Ht 64.0 in | Wt 193.4 lb

## 2018-12-31 DIAGNOSIS — I10 Essential (primary) hypertension: Secondary | ICD-10-CM

## 2018-12-31 DIAGNOSIS — Z23 Encounter for immunization: Secondary | ICD-10-CM

## 2018-12-31 DIAGNOSIS — R197 Diarrhea, unspecified: Secondary | ICD-10-CM | POA: Diagnosis not present

## 2018-12-31 DIAGNOSIS — Z1211 Encounter for screening for malignant neoplasm of colon: Secondary | ICD-10-CM

## 2018-12-31 LAB — CBC WITH DIFFERENTIAL/PLATELET
Basophils Absolute: 0 10*3/uL (ref 0.0–0.1)
Basophils Relative: 0.7 % (ref 0.0–3.0)
Eosinophils Absolute: 0.2 10*3/uL (ref 0.0–0.7)
Eosinophils Relative: 2.6 % (ref 0.0–5.0)
HCT: 41.2 % (ref 36.0–46.0)
Hemoglobin: 13.8 g/dL (ref 12.0–15.0)
Lymphocytes Relative: 27.2 % (ref 12.0–46.0)
Lymphs Abs: 1.9 10*3/uL (ref 0.7–4.0)
MCHC: 33.6 g/dL (ref 30.0–36.0)
MCV: 91.4 fl (ref 78.0–100.0)
Monocytes Absolute: 0.6 10*3/uL (ref 0.1–1.0)
Monocytes Relative: 8.2 % (ref 3.0–12.0)
Neutro Abs: 4.4 10*3/uL (ref 1.4–7.7)
Neutrophils Relative %: 61.3 % (ref 43.0–77.0)
Platelets: 201 10*3/uL (ref 150.0–400.0)
RBC: 4.5 Mil/uL (ref 3.87–5.11)
RDW: 13.2 % (ref 11.5–15.5)
WBC: 7.2 10*3/uL (ref 4.0–10.5)

## 2018-12-31 LAB — COMPREHENSIVE METABOLIC PANEL
ALT: 15 U/L (ref 0–35)
AST: 15 U/L (ref 0–37)
Albumin: 4.1 g/dL (ref 3.5–5.2)
Alkaline Phosphatase: 72 U/L (ref 39–117)
BUN: 13 mg/dL (ref 6–23)
CO2: 33 mEq/L — ABNORMAL HIGH (ref 19–32)
Calcium: 9.4 mg/dL (ref 8.4–10.5)
Chloride: 99 mEq/L (ref 96–112)
Creatinine, Ser: 0.68 mg/dL (ref 0.40–1.20)
GFR: 85.48 mL/min (ref >60.00)
Glucose, Bld: 97 mg/dL (ref 70–99)
Potassium: 3.7 mEq/L (ref 3.5–5.1)
Sodium: 142 mEq/L (ref 135–145)
Total Bilirubin: 0.6 mg/dL (ref 0.2–1.2)
Total Protein: 7.2 g/dL (ref 6.0–8.3)

## 2018-12-31 LAB — LIPID PANEL
Cholesterol: 160 mg/dL (ref 0–200)
HDL: 59.5 mg/dL (ref >39.00)
LDL Cholesterol: 88 mg/dL (ref 0–99)
NonHDL: 100.17
Total CHOL/HDL Ratio: 3
Triglycerides: 59 mg/dL (ref 0.0–149.0)
VLDL: 11.8 mg/dL (ref 0.0–40.0)

## 2018-12-31 MED ORDER — LISINOPRIL-HYDROCHLOROTHIAZIDE 10-12.5 MG PO TABS: 1.0000 | ORAL_TABLET | Freq: Every day | ORAL | 1 refills | Status: DC

## 2018-12-31 MED FILL — LISINOPRIL-HCTZ 10-12.5 MG: 10-12.5 | 30 days supply | Qty: 30 | Fill #0

## 2018-12-31 NOTE — Assessment & Plan Note (Addendum)
Poorly controlled will alter medications, encouraged DASH diet, minimize caffeine and obtain adequate sleep. Report concerning symptoms and follow up as directed and as needed 

## 2018-12-31 NOTE — Assessment & Plan Note (Signed)
Since GB surgery Refer to GI

## 2018-12-31 NOTE — Patient Instructions (Signed)

## 2018-12-31 NOTE — Progress Notes (Signed)
Patient ID: Arayah Nickolas [id number], female    DOB: May 01, 1950  Age: 68 y.o. MRN: [id number]    Subjective:  Subjective  HPI Deyanira Lins [id number] presents for f/u bp  She is feeling much better----  Still running high at home   Review of Systems  Constitutional: Negative for appetite change, diaphoresis, fatigue and unexpected weight change.  Eyes: Negative for pain, redness and visual disturbance.  Respiratory: Negative for cough, chest tightness, shortness of breath and wheezing.   Cardiovascular: Negative for chest pain, palpitations and leg swelling.  Endocrine: Negative for cold intolerance, heat intolerance, polydipsia, polyphagia and polyuria.  Genitourinary: Negative for difficulty urinating, dysuria and frequency.  Neurological: Negative for dizziness, light-headedness, numbness and headaches.    History Past Medical History:  Diagnosis Date  . Arthritis    hands, knees   . Complication of anesthesia   . Family history of adverse reaction to anesthesia    mother of pt. wakes slowly  . Hypertension   . PONV (postoperative nausea and vomiting)     She has a past surgical history that includes Band Aid surgery (1980's); Tubal ligation; and Cholecystectomy (N/A, 06/25/2014).   Her family history includes Breast cancer in her mother and sister; COPD in her father; Cancer in her brother and mother; Cervical cancer in her mother; Colon cancer in her sister; Colon polyps in her daughter; Heart attack (age of onset: 19) in her brother; Heart disease in her brother and brother; Heart disease (age of onset: 47) in her father; Heart disease (age of onset: 73) in her mother; Liver cancer in her sister; Lung cancer in her sister; Uterine cancer in her mother.She reports that she has never smoked. She has never used smokeless tobacco. She reports that she does not drink alcohol or use drugs.  Current Outpatient Medications on File Prior to Visit  Medication Sig Dispense Refill  . amLODipine (NORVASC)  5 MG tablet Take 1 tablet (5 mg total) by mouth daily. 90 tablet 1  . aspirin 81 MG tablet Take 81 mg by mouth daily.      . Calcium Carbonate-Vit D-Min (CALCIUM 1200 PO) Take by mouth.    . Cholecalciferol (D3 HIGH POTENCY) 50 MCG (2000 UT) CAPS Take by mouth.    . Fluticasone Propionate (FLONASE NA) Place into the nose.    . ibuprofen (ADVIL,MOTRIN) 400 MG tablet Take 400 mg by mouth every 6 (six) hours as needed for mild pain.    . naproxen sodium (ALEVE) 220 MG tablet Take 220 mg by mouth daily as needed.    . TURMERIC PO Take by mouth.     No current facility-administered medications on file prior to visit.      Objective:  Objective  Physical Exam Vitals signs and nursing note reviewed.  Constitutional:      Appearance: She is well-developed.  HENT:     Head: Normocephalic and atraumatic.  Eyes:     Conjunctiva/sclera: Conjunctivae normal.  Neck:     Musculoskeletal: Normal range of motion and neck supple.     Thyroid: No thyromegaly.     Vascular: No carotid bruit or JVD.  Cardiovascular:     Rate and Rhythm: Normal rate and regular rhythm.     Heart sounds: Normal heart sounds. No murmur.  Pulmonary:     Effort: Pulmonary effort is normal. No respiratory distress.     Breath sounds: Normal breath sounds. No wheezing or rales.  Chest:     Chest wall: No tenderness.  Neurological:     Mental Status: She is alert and oriented to person, place, and time.    BP 140/84 (BP Location: Left Arm, Patient Position: Sitting, Cuff Size: Normal)   Pulse 80   Temp 97.7 F (36.5 C) (Temporal)   Resp 18   Ht 5\' 4"  (1.626 m)   Wt 193 lb 6.4 oz (87.7 kg)   SpO2 97%   BMI 33.20 kg/m  Wt Readings from Last 3 Encounters:  12/31/18 193 lb 6.4 oz (87.7 kg)  12/03/18 192 lb 2 oz (87.1 kg)  05/17/18 197 lb 3.2 oz (89.4 kg)     Lab Results  Component Value Date   WBC 8.2 12/03/2018   HGB 14.5 12/03/2018   HCT 44.0 12/03/2018   PLT 203.0 12/03/2018   GLUCOSE 97 12/03/2018    CHOL 162 05/18/2018   TRIG 74.0 05/18/2018   HDL 49.70 05/18/2018   LDLCALC 97 05/18/2018   ALT 14 05/18/2018   AST 15 05/18/2018   NA 142 12/03/2018   K 3.7 12/03/2018   CL 100 12/03/2018   CREATININE 0.69 12/03/2018   BUN 13 12/03/2018   CO2 34 (H) 12/03/2018   TSH 1.54 04/02/2012   MICROALBUR 0.8 04/02/2012    Dg Shoulder Right  Result Date: 05/17/2018 CLINICAL DATA:  Patient injured her right shoulder playing with her grandchildren 6 months ago. Persistent pain. EXAM: RIGHT SHOULDER - 2+ VIEW COMPARISON:  None. FINDINGS: No fracture or bone lesion. Glenohumeral joint normally spaced and aligned. Mild narrowing of the [facility] joint with minor marginal osteophytes. Bones are demineralized. Surrounding soft tissues are unremarkable. IMPRESSION: 1. No fracture or acute finding. 2. Mild AC joint osteoarthritis. Electronically Signed   By: Lajean Manes M.D.   On: 05/17/2018 16:58     Assessment & Plan:  Plan  I have discontinued Jla Fabris. Battie's hydrochlorothiazide. I am also having her start on lisinopril-hydrochlorothiazide. Additionally, I am having her maintain her aspirin, ibuprofen, naproxen sodium, TURMERIC PO, Calcium Carbonate-Vit D-Min (CALCIUM 1200 PO), Cholecalciferol, Fluticasone Propionate (FLONASE NA), and amLODipine.  Meds ordered this encounter  Medications  . lisinopril-hydrochlorothiazide (ZESTORETIC) 10-12.5 MG tablet    Sig: Take 1 tablet by mouth daily.    Dispense:  30 tablet    Refill:  1    Problem List Items Addressed This Visit      Unprioritized   Diarrhea    Since GB surgery Refer to GI       Relevant Orders   Ambulatory referral to Gastroenterology   Essential hypertension - Primary    Poorly controlled will alter medications, encouraged DASH diet, minimize caffeine and obtain adequate sleep. Report concerning symptoms and follow up as directed and as needed      Relevant Medications   lisinopril-hydrochlorothiazide (ZESTORETIC) 10-12.5 MG  tablet   Other Relevant Orders   Lipid panel   CBC with Differential/Platelet   Comprehensive metabolic panel    Other Visit Diagnoses    Need for influenza vaccination       Relevant Orders   Flu Vaccine QUAD High Dose(Fluad)   Colon cancer screening       Relevant Orders   Fecal occult blood, imunochemical      Follow-up: Return in about 2 weeks (around 01/14/2019), or if symptoms worsen or fail to improve, for hypertension.  Ann Held, DO

## 2019-01-01 ENCOUNTER — Encounter: Payer: Self-pay | Admitting: *Deleted

## 2019-01-02 ENCOUNTER — Other Ambulatory Visit (INDEPENDENT_AMBULATORY_CARE_PROVIDER_SITE_OTHER): Payer: Medicare HMO

## 2019-01-02 DIAGNOSIS — Z1211 Encounter for screening for malignant neoplasm of colon: Secondary | ICD-10-CM | POA: Diagnosis not present

## 2019-01-02 LAB — FECAL OCCULT BLOOD, IMMUNOCHEMICAL: Fecal Occult Bld: NEGATIVE

## 2019-01-11 ENCOUNTER — Other Ambulatory Visit: Payer: Self-pay

## 2019-01-14 ENCOUNTER — Other Ambulatory Visit: Payer: Self-pay

## 2019-01-14 ENCOUNTER — Encounter: Payer: Self-pay | Admitting: Family Medicine

## 2019-01-14 ENCOUNTER — Ambulatory Visit (INDEPENDENT_AMBULATORY_CARE_PROVIDER_SITE_OTHER): Payer: Medicare HMO | Admitting: Family Medicine

## 2019-01-14 VITALS — BP 128/47 | HR 77 | Temp 97.3°F | Resp 12 | Ht 64.0 in | Wt 195.4 lb

## 2019-01-14 DIAGNOSIS — I10 Essential (primary) hypertension: Secondary | ICD-10-CM | POA: Diagnosis not present

## 2019-01-14 LAB — BASIC METABOLIC PANEL
BUN: 15 mg/dL (ref 6–23)
CO2: 30 mEq/L (ref 19–32)
Calcium: 9.4 mg/dL (ref 8.4–10.5)
Chloride: 103 mEq/L (ref 96–112)
Creatinine, Ser: 0.75 mg/dL (ref 0.40–1.20)
GFR: 76.33 mL/min (ref >60.00)
Glucose, Bld: 103 mg/dL — ABNORMAL HIGH (ref 70–99)
Potassium: 4.2 mEq/L (ref 3.5–5.1)
Sodium: 142 mEq/L (ref 135–145)

## 2019-01-14 MED ORDER — LISINOPRIL-HYDROCHLOROTHIAZIDE 10-12.5 MG PO TABS: 1.0000 | ORAL_TABLET | Freq: Every day | ORAL | 1 refills | Status: DC

## 2019-01-14 NOTE — Patient Instructions (Signed)

## 2019-01-14 NOTE — Assessment & Plan Note (Signed)
Well controlled, no changes to meds. Encouraged heart healthy diet such as the DASH diet and exercise as tolerated.  °

## 2019-01-14 NOTE — Progress Notes (Signed)
Patient ID: Mindy Anderson [id number], female    DOB: 01/04/1951  Age: 68 y.o. MRN: [id number]    Subjective:  Subjective  HPI Mindy Anderson [id number] presents for f/u bp   No complaintsl  Review of Systems  Constitutional: Negative for appetite change, diaphoresis, fatigue and unexpected weight change.  Eyes: Negative for pain, redness and visual disturbance.  Respiratory: Negative for cough, chest tightness, shortness of breath and wheezing.   Cardiovascular: Negative for chest pain, palpitations and leg swelling.  Endocrine: Negative for cold intolerance, heat intolerance, polydipsia, polyphagia and polyuria.  Genitourinary: Negative for difficulty urinating, dysuria and frequency.  Neurological: Negative for dizziness, light-headedness, numbness and headaches.    History Past Medical History:  Diagnosis Date  . Arthritis    hands, knees   . Complication of anesthesia   . Family history of adverse reaction to anesthesia    mother of pt. wakes slowly  . Hypertension   . PONV (postoperative nausea and vomiting)     She has a past surgical history that includes Band Aid surgery (1980's); Tubal ligation; and Cholecystectomy (N/A, 06/25/2014).   Her family history includes Breast cancer in her mother and sister; COPD in her father; Cancer in her brother and mother; Cervical cancer in her mother; Colon cancer in her sister; Colon polyps in her daughter; Heart attack (age of onset: 31) in her brother; Heart disease in her brother and brother; Heart disease (age of onset: 26) in her father; Heart disease (age of onset: 57) in her mother; Liver cancer in her sister; Lung cancer in her sister; Uterine cancer in her mother.She reports that she has never smoked. She has never used smokeless tobacco. She reports that she does not drink alcohol or use drugs.  Current Outpatient Medications on File Prior to Visit  Medication Sig Dispense Refill  . amLODipine (NORVASC) 5 MG tablet Take 1 tablet (5 mg total) by  mouth daily. 90 tablet 1  . aspirin 81 MG tablet Take 81 mg by mouth daily.      . Calcium Carbonate-Vit D-Min (CALCIUM 1200 PO) Take by mouth.    . Cholecalciferol (D3 HIGH POTENCY) 50 MCG (2000 UT) CAPS Take by mouth.    . Fluticasone Propionate (FLONASE NA) Place into the nose.    . ibuprofen (ADVIL,MOTRIN) 400 MG tablet Take 400 mg by mouth every 6 (six) hours as needed for mild pain.    . naproxen sodium (ALEVE) 220 MG tablet Take 220 mg by mouth daily as needed.    . TURMERIC PO Take by mouth.     No current facility-administered medications on file prior to visit.      Objective:  Objective  Physical Exam Vitals signs and nursing note reviewed.  Constitutional:      Appearance: She is well-developed.  HENT:     Head: Normocephalic and atraumatic.  Eyes:     Conjunctiva/sclera: Conjunctivae normal.  Neck:     Musculoskeletal: Normal range of motion and neck supple.     Thyroid: No thyromegaly.     Vascular: No carotid bruit or JVD.  Cardiovascular:     Rate and Rhythm: Normal rate and regular rhythm.     Heart sounds: Normal heart sounds. No murmur.  Pulmonary:     Effort: Pulmonary effort is normal. No respiratory distress.     Breath sounds: Normal breath sounds. No wheezing or rales.  Chest:     Chest wall: No tenderness.  Neurological:     Mental Status: She  is alert and oriented to person, place, and time.    BP (!) 128/47 (BP Location: Left Arm, Cuff Size: Large)   Pulse 77   Temp (!) 97.3 F (36.3 C) (Temporal)   Resp 12   Ht 5\' 4"  (1.626 m)   Wt 195 lb 6.4 oz (88.6 kg)   SpO2 99%   BMI 33.54 kg/m  Wt Readings from Last 3 Encounters:  01/14/19 195 lb 6.4 oz (88.6 kg)  12/31/18 193 lb 6.4 oz (87.7 kg)  12/03/18 192 lb 2 oz (87.1 kg)     Lab Results  Component Value Date   WBC 7.2 12/31/2018   HGB 13.8 12/31/2018   HCT 41.2 12/31/2018   PLT 201.0 12/31/2018   GLUCOSE 97 12/31/2018   CHOL 160 12/31/2018   TRIG 59.0 12/31/2018   HDL 59.50  12/31/2018   LDLCALC 88 12/31/2018   ALT 15 12/31/2018   AST 15 12/31/2018   NA 142 12/31/2018   K 3.7 12/31/2018   CL 99 12/31/2018   CREATININE 0.68 12/31/2018   BUN 13 12/31/2018   CO2 33 (H) 12/31/2018   TSH 1.54 04/02/2012   MICROALBUR 0.8 04/02/2012    Dg Shoulder Right  Result Date: 05/17/2018 CLINICAL DATA:  Patient injured her right shoulder playing with her grandchildren 6 months ago. Persistent pain. EXAM: RIGHT SHOULDER - 2+ VIEW COMPARISON:  None. FINDINGS: No fracture or bone lesion. Glenohumeral joint normally spaced and aligned. Mild narrowing of the [facility] joint with minor marginal osteophytes. Bones are demineralized. Surrounding soft tissues are unremarkable. IMPRESSION: 1. No fracture or acute finding. 2. Mild AC joint osteoarthritis. Electronically Signed   By: Lajean Manes M.D.   On: 05/17/2018 16:58     Assessment & Plan:  Plan  I am having Mindy Anderson maintain her aspirin, ibuprofen, naproxen sodium, TURMERIC PO, Calcium Carbonate-Vit D-Min (CALCIUM 1200 PO), Cholecalciferol, Fluticasone Propionate (FLONASE NA), amLODipine, and lisinopril-hydrochlorothiazide.  Meds ordered this encounter  Medications  . lisinopril-hydrochlorothiazide (ZESTORETIC) 10-12.5 MG tablet    Sig: Take 1 tablet by mouth daily.    Dispense:  90 tablet    Refill:  1    Problem List Items Addressed This Visit      Unprioritized   Essential hypertension - Primary    Well controlled, no changes to meds. Encouraged heart healthy diet such as the DASH diet and exercise as tolerated.       Relevant Medications   lisinopril-hydrochlorothiazide (ZESTORETIC) 10-12.5 MG tablet   Other Relevant Orders   Basic metabolic panel      Follow-up: Return in about 3 months (around 04/16/2019), or if symptoms worsen or fail to improve, for hypertension.  Ann Held, DO

## 2019-02-05 ENCOUNTER — Ambulatory Visit: Payer: Medicare HMO | Admitting: Gastroenterology

## 2019-05-15 ENCOUNTER — Other Ambulatory Visit: Payer: Self-pay | Admitting: Family Medicine

## 2019-05-19 NOTE — Progress Notes (Signed)
Virtual Visit via Audio Note  I connected with patient on 05/21/19 at  3:15 PM EST by audio enabled telemedicine application and verified that I am speaking with the correct person using two identifiers.   THIS ENCOUNTER IS A VIRTUAL VISIT DUE TO COVID-19 - PATIENT WAS NOT SEEN IN THE OFFICE. PATIENT HAS CONSENTED TO VIRTUAL VISIT / TELEMEDICINE VISIT   Location of patient: home  Location of provider: office  I discussed the limitations of evaluation and management by telemedicine and the availability of in person appointments. The patient expressed understanding and agreed to proceed.   Subjective:   Mindy Anderson is a 69 y.o. female who presents for Medicare Annual (Subsequent) preventive examination.  Review of Systems:  Cardiac Risk Factors include: advanced age (>56men, >77 women);hypertension   Home Safety/Smoke Alarms: Feels safe in home. Smoke alarms in place.  Lives w/ husband in 1 story home. Walk-in shower. Ramp entry.  Female:       Mammo- active order      Dexa scan- active order [facility]- next due 12/2023    Objective:     Vitals: Unable to assess. This visit is enabled though telemedicine due to Covid 19.   Advanced Directives 05/21/2019 05/17/2018 04/21/2017 04/11/2016 06/19/2014 05/27/2014 12/26/2013  Does Patient Have a Medical Advance Directive? No No Yes No No No No  Does patient want to make changes to medical advance directive? - - No - Patient declined Yes (MAU/Ambulatory/Procedural Areas - Information given) - - -  Would patient like information on creating a medical advance directive? No - Patient declined No - Patient declined - - Yes - Scientist, clinical (histocompatibility and immunogenetics) given - -    Tobacco Social History   Tobacco Use  Smoking Status Never Smoker  Smokeless Tobacco Never Used     Counseling given: Not Answered   Clinical Intake: Pain : No/denies pain     Past Medical History:  Diagnosis Date  . Arthritis    hands, knees   . Complication of anesthesia     . Family history of adverse reaction to anesthesia    mother of pt. wakes slowly  . Hypertension   . PONV (postoperative nausea and vomiting)    Past Surgical History:  Procedure Laterality Date  . Band Aid surgery  1980's  . CHOLECYSTECTOMY N/A 06/25/2014   Procedure: LAPAROSCOPIC CHOLECYSTECTOMY;  Surgeon: Ralene Ok, MD;  Location: [facility] OR;  Service: General;  Laterality: N/A;  . TUBAL LIGATION     Family History  Problem Relation Age of Onset  . Uterine cancer Mother   . Heart disease Mother 42       chf  . Breast cancer Mother   . Cervical cancer Mother   . Cancer Mother        skin  . COPD Father   . Heart disease Father 5       MI  . Heart disease Brother        cabg with stents  . Cancer Brother        Skin cancer  . Heart disease Brother   . Heart attack Brother 11  . Lung cancer Sister   . Liver cancer Sister   . Breast cancer Sister   . Colon cancer Sister   . Colon polyps Daughter   . Rectal cancer Neg Hx   . Stomach cancer Neg Hx   . Esophageal cancer Neg Hx    Social History   Socioeconomic History  . Marital status: Married  Spouse name: Not on file  . Number of children: Not on file  . Years of education: Not on file  . Highest education level: Not on file  Occupational History  . Occupation: retired  Tobacco Use  . Smoking status: Never Smoker  . Smokeless tobacco: Never Used  Substance and Sexual Activity  . Alcohol use: No    Alcohol/week: 0.0 standard drinks  . Drug use: No  . Sexual activity: Never    Partners: Male  Other Topics Concern  . Not on file  Social History Narrative   Exercise-- no   Social Determinants of Health   Financial Resource Strain:   . Difficulty of Paying Living Expenses: Not on file  Food Insecurity:   . Worried About Charity fundraiser in the Last Year: Not on file  . Ran Out of Food in the Last Year: Not on file  Transportation Needs:   . Lack of Transportation (Medical): Not on file  . Lack  of Transportation (Non-Medical): Not on file  Physical Activity:   . Days of Exercise per Week: Not on file  . Minutes of Exercise per Session: Not on file  Stress:   . Feeling of Stress : Not on file  Social Connections:   . Frequency of Communication with Friends and Family: Not on file  . Frequency of Social Gatherings with Friends and Family: Not on file  . Attends Religious Services: Not on file  . Active Member of Clubs or Organizations: Not on file  . Attends Archivist Meetings: Not on file  . Marital Status: Not on file    Outpatient Encounter Medications as of 05/21/2019  Medication Sig  . amLODipine (NORVASC) 5 MG tablet TAKE 1 TABLET EVERY DAY  . aspirin 81 MG tablet Take 81 mg by mouth daily.    . Calcium Carbonate-Vit D-Min (CALCIUM 1200 PO) Take by mouth.  . Cholecalciferol (D3 HIGH POTENCY) 50 MCG (2000 UT) CAPS Take by mouth.  . diclofenac Sodium (VOLTAREN) 1 % GEL Apply 4 g topically 4 (four) times daily.  . Fluticasone Propionate (FLONASE NA) Place into the nose.  . ibuprofen (ADVIL,MOTRIN) 400 MG tablet Take 400 mg by mouth every 6 (six) hours as needed for mild pain.  Marland Kitchen lisinopril-hydrochlorothiazide (ZESTORETIC) 10-12.5 MG tablet Take 1 tablet by mouth daily.  . naproxen sodium (ALEVE) 220 MG tablet Take 220 mg by mouth daily as needed.  . TURMERIC PO Take by mouth.   No facility-administered encounter medications on file as of 05/21/2019.    Activities of Daily Living In your present state of health, do you have any difficulty performing the following activities: 05/21/2019 05/21/2019  Hearing? N N  Vision? N N  Difficulty concentrating or making decisions? N N  Walking or climbing stairs? N Y  Dressing or bathing? N N  Doing errands, shopping? N N  Preparing Food and eating ? N -  Using the Toilet? N -  In the past six months, have you accidently leaked urine? N -  Do you have problems with loss of bowel control? N -  Managing your Medications?  N -  Managing your Finances? N -  Housekeeping or managing your Housekeeping? N -  Some recent data might be hidden    Patient Care Team: Carollee Herter, Alferd Apa, DO as PCP - General Calvert Cantor, MD as Consulting Physician (Ophthalmology)    Assessment:   This is a routine wellness examination for Arijah. Physical assessment deferred  to PCP.  Exercise Activities and Dietary recommendations Current Exercise Habits: The patient does not participate in regular exercise at present, Exercise limited by: None identified Diet (meal preparation, eat out, water intake, caffeinated beverages, dairy products, fruits and vegetables): well balanced  Goals    . DIET - INCREASE WATER INTAKE    . DIET - REDUCE SODIUM INTAKE    . Increase physical activity       Fall Risk Fall Risk  05/21/2019 05/21/2019 04/27/2018 04/21/2017 04/11/2016  Falls in the past year? 0 0 0 Yes Yes  Number falls in past yr: 0 0 - 2 or more 2 or more  Injury with Fall? 0 0 - No No  Follow up Education provided;Falls prevention discussed Falls evaluation completed - Education provided Education provided;Falls prevention discussed   Depression Screen PHQ 2/9 Scores 05/21/2019 05/21/2019 05/17/2018 04/27/2018  PHQ - 2 Score 0 0 0 0     Cognitive Function Ad8 score reviewed for issues:  Issues making decisions:no  Less interest in hobbies / activities:no  Repeats questions, stories (family complaining):no  Trouble using ordinary gadgets (microwave, computer, phone):no  Forgets the month or year: no  Mismanaging finances: no  Remembering appts:no  Daily problems with thinking and/or memory:no Ad8 score is=0   MMSE - Mini Mental State Exam 05/17/2018 04/21/2017 04/11/2016  Orientation to time 5 5 5   Orientation to Place 5 5 5   Registration 3 3 3   Attention/ Calculation 4 5 5   Recall 3 2 3   Language- name 2 objects 2 2 2   Language- repeat 1 1 1   Language- follow 3 step command 3 3 3   Language- read & follow  direction 1 1 1   Write a sentence 1 1 1   Copy design 1 1 1   Total score 29 29 30         Immunization History  Administered Date(s) Administered  . Fluad Quad(high Dose 65+) 12/31/2018  . Influenza Inj Mdck Quad Pf 01/13/2018  . Influenza Split 02/09/2011  . Influenza Whole 01/09/2008, 03/11/2009, 03/09/2010, 02/03/2012  . Influenza,inj,Quad PF,6+ Mos 01/15/2014, 01/09/2015  . Influenza-Unspecified 01/29/2016  . Pneumococcal Conjugate-13 10/21/2014  . Pneumococcal Polysaccharide-23 10/17/2013  . Td 02/11/2008  . Zoster 05/18/2009  . Zoster Recombinat (Shingrix) 01/13/2018, 04/20/2018   Screening Tests Health Maintenance  Topic Date Due  . Hepatitis C Screening  1950/08/04  . TETANUS/TDAP  02/10/2018  . MAMMOGRAM  04/25/2019  . COLONOSCOPY  12/27/2023  . INFLUENZA VACCINE  Completed  . DEXA SCAN  Completed  . PNA vac Low Risk Adult  Completed      Plan:    Please schedule your next medicare wellness visit with me in 1 yr.  Continue to eat heart healthy diet (full of fruits, vegetables, whole grains, lean protein, water--limit salt, fat, and sugar intake) and increase physical activity as tolerated.  Continue doing brain stimulating activities (puzzles, reading, adult coloring books, staying active) to keep memory sharp.   Please schedule bone density scan and mammogram.   I have personally reviewed and noted the following in the patient's chart:   . Medical and social history . Use of alcohol, tobacco or illicit drugs  . Current medications and supplements . Functional ability and status . Nutritional status . Physical activity . Advanced directives . List of other physicians . Hospitalizations, surgeries, and ER visits in previous 12 months . Vitals . Screenings to include cognitive, depression, and falls . Referrals and appointments  In addition, I have reviewed and discussed  with patient certain preventive protocols, quality metrics, and best practice  recommendations. A written personalized care plan for preventive services as well as general preventive health recommendations were provided to patient.     Shela Nevin, [place]  05/21/2019

## 2019-05-20 ENCOUNTER — Other Ambulatory Visit: Payer: Self-pay

## 2019-05-21 ENCOUNTER — Encounter: Payer: Self-pay | Admitting: *Deleted

## 2019-05-21 ENCOUNTER — Ambulatory Visit (INDEPENDENT_AMBULATORY_CARE_PROVIDER_SITE_OTHER): Payer: Medicare HMO | Admitting: *Deleted

## 2019-05-21 ENCOUNTER — Ambulatory Visit (INDEPENDENT_AMBULATORY_CARE_PROVIDER_SITE_OTHER): Payer: Medicare HMO | Admitting: Family Medicine

## 2019-05-21 ENCOUNTER — Encounter: Payer: Self-pay | Admitting: Family Medicine

## 2019-05-21 VITALS — BP 128/80 | HR 85 | Temp 97.6°F | Resp 18 | Ht 64.0 in | Wt 208.0 lb

## 2019-05-21 DIAGNOSIS — I10 Essential (primary) hypertension: Secondary | ICD-10-CM | POA: Diagnosis not present

## 2019-05-21 DIAGNOSIS — M170 Bilateral primary osteoarthritis of knee: Secondary | ICD-10-CM

## 2019-05-21 DIAGNOSIS — Z0000 Encounter for general adult medical examination without abnormal findings: Secondary | ICD-10-CM

## 2019-05-21 DIAGNOSIS — J449 Chronic obstructive pulmonary disease, unspecified: Secondary | ICD-10-CM | POA: Diagnosis not present

## 2019-05-21 LAB — COMPREHENSIVE METABOLIC PANEL
ALT: 16 U/L (ref 0–35)
AST: 16 U/L (ref 0–37)
Albumin: 3.8 g/dL (ref 3.5–5.2)
Alkaline Phosphatase: 80 U/L (ref 39–117)
BUN: 12 mg/dL (ref 6–23)
CO2: 32 mEq/L (ref 19–32)
Calcium: 9.1 mg/dL (ref 8.4–10.5)
Chloride: 102 mEq/L (ref 96–112)
Creatinine, Ser: 0.73 mg/dL (ref 0.40–1.20)
GFR: 78.67 mL/min (ref >60.00)
Glucose, Bld: 105 mg/dL — ABNORMAL HIGH (ref 70–99)
Potassium: 4.3 mEq/L (ref 3.5–5.1)
Sodium: 140 mEq/L (ref 135–145)
Total Bilirubin: 0.5 mg/dL (ref 0.2–1.2)
Total Protein: 6.8 g/dL (ref 6.0–8.3)

## 2019-05-21 LAB — LIPID PANEL
Cholesterol: 155 mg/dL (ref 0–200)
HDL: 54.6 mg/dL (ref >39.00)
LDL Cholesterol: 89 mg/dL (ref 0–99)
NonHDL: 100.41
Total CHOL/HDL Ratio: 3
Triglycerides: 58 mg/dL (ref 0.0–149.0)
VLDL: 11.6 mg/dL (ref 0.0–40.0)

## 2019-05-21 MED ORDER — DICLOFENAC SODIUM 1 % EX GEL: 4.0000 g | Freq: Four times a day (QID) | CUTANEOUS | 2 refills | Status: DC

## 2019-05-21 MED FILL — DICLOFENAC SODIUM 1 % GEL: 1 | 13 days supply | Qty: 200 | Fill #0

## 2019-05-21 NOTE — Progress Notes (Addendum)
+ Subjective:     Mindy Anderson is a 69 y.o. female and is here for a comprehensive physical exam. The patient reports no problems.  Social History   Socioeconomic History  . Marital status: Married    Spouse name: Not on file  . Number of children: Not on file  . Years of education: Not on file  . Highest education level: Not on file  Occupational History  . Occupation: retired  Tobacco Use  . Smoking status: Never Smoker  . Smokeless tobacco: Never Used  Substance and Sexual Activity  . Alcohol use: No    Alcohol/week: 0.0 standard drinks  . Drug use: No  . Sexual activity: Never    Partners: Male  Other Topics Concern  . Not on file  Social History Narrative   Exercise-- no   Social Determinants of Health   Financial Resource Strain: Low Risk   . Difficulty of Paying Living Expenses: Not hard at all  Food Insecurity:   . Worried About Charity fundraiser in the Last Year:   . Arboriculturist in the Last Year:   Transportation Needs: No Transportation Needs  . Lack of Transportation (Medical): No  . Lack of Transportation (Non-Medical): No  Physical Activity:   . Days of Exercise per Week:   . Minutes of Exercise per Session:   Stress:   . Feeling of Stress :   Social Connections:   . Frequency of Communication with Friends and Family:   . Frequency of Social Gatherings with Friends and Family:   . Attends Religious Services:   . Active Member of Clubs or Organizations:   . Attends Archivist Meetings:   Marland Kitchen Marital Status:   Intimate Partner Violence:   . Fear of Current or Ex-Partner:   . Emotionally Abused:   Marland Kitchen Physically Abused:   . Sexually Abused:    Health Maintenance  Topic Date Due  . Hepatitis C Screening  Never done  . TETANUS/TDAP  02/10/2018  . MAMMOGRAM  04/25/2019  . COLONOSCOPY  12/27/2023  . INFLUENZA VACCINE  Completed  . DEXA SCAN  Completed  . PNA vac Low Risk Adult  Completed    The following portions of the  patient's history were reviewed and updated as appropriate:  She  has a past medical history of Arthritis, Complication of anesthesia, Family history of adverse reaction to anesthesia, Hypertension, and PONV (postoperative nausea and vomiting). She does not have any pertinent problems on file. She  has a past surgical history that includes Band Aid surgery (1980's); Tubal ligation; and Cholecystectomy (N/A, 06/25/2014). Her family history includes Breast cancer in her mother and sister; COPD in her father; Cancer in her brother and mother; Cervical cancer in her mother; Colon cancer in her sister; Colon polyps in her daughter; Heart attack (age of onset: 27) in her brother; Heart disease in her brother and brother; Heart disease (age of onset: 6) in her father; Heart disease (age of onset: 34) in her mother; Liver cancer in her sister; Lung cancer in her sister; Uterine cancer in her mother. She  reports that she has never smoked. She has never used smokeless tobacco. She reports that she does not drink alcohol or use drugs. She has a current medication list which includes the following prescription(s): amlodipine, aspirin, calcium carbonate-vit d-min, cholecalciferol, fluticasone propionate, ibuprofen, lisinopril-hydrochlorothiazide, naproxen sodium, turmeric, and diclofenac sodium. Current Outpatient Medications on File Prior to Visit  Medication Sig Dispense Refill  .  amLODipine (NORVASC) 5 MG tablet TAKE 1 TABLET EVERY DAY 90 tablet 1  . aspirin 81 MG tablet Take 81 mg by mouth daily.      . Calcium Carbonate-Vit D-Min (CALCIUM 1200 PO) Take by mouth.    . Cholecalciferol (D3 HIGH POTENCY) 50 MCG (2000 UT) CAPS Take by mouth.    . Fluticasone Propionate (FLONASE NA) Place into the nose.    . ibuprofen (ADVIL,MOTRIN) 400 MG tablet Take 400 mg by mouth every 6 (six) hours as needed for mild pain.    Marland Kitchen lisinopril-hydrochlorothiazide (ZESTORETIC) 10-12.5 MG tablet Take 1 tablet by mouth daily. 90  tablet 1  . naproxen sodium (ALEVE) 220 MG tablet Take 220 mg by mouth daily as needed.    . TURMERIC PO Take by mouth.     No current facility-administered medications on file prior to visit.   She is allergic to hydrocodone and tramadol..  Review of Systems  Review of Systems  Constitutional: Negative for activity change, appetite change and fatigue.  HENT: Negative for hearing loss, congestion, tinnitus and ear discharge.   Eyes: Negative for visual disturbance (see optho q1y -- vision corrected to 20/20 with glasses).  Respiratory: Negative for cough, chest tightness and shortness of breath.   Cardiovascular: Negative for chest pain, palpitations and leg swelling.  Gastrointestinal: Negative for abdominal pain, diarrhea, constipation and abdominal distention.  Genitourinary: Negative for urgency, frequency, decreased urine volume and difficulty urinating.  Musculoskeletal: Negative for back pain, arthralgias and gait problem.  Skin: Negative for color change, pallor and rash.  Neurological: Negative for dizziness, light-headedness, numbness and headaches.  Hematological: Negative for adenopathy. Does not bruise/bleed easily.  Psychiatric/Behavioral: Negative for suicidal ideas, confusion, sleep disturbance, self-injury, dysphoric mood, decreased concentration and agitation.  Pt is able to read and write and can do all ADLs No risk for falling No abuse/ violence in home    Objective:    BP 128/80 (BP Location: Right Arm, Patient Position: Sitting, Cuff Size: Large)   Pulse 85   Temp 97.6 F (36.4 C) (Temporal)   Resp 18   Ht 5\' 4"  (1.626 m)   Wt 208 lb (94.3 kg)   SpO2 95%   BMI 35.70 kg/m  General appearance: alert, cooperative, appears stated age and no distress Head: Normocephalic, without obvious abnormality, atraumatic Eyes: negative findings: lids and lashes normal, conjunctivae and sclerae normal and pupils equal, round, reactive to light and accomodation Ears:  normal TM's and external ear canals both ears Neck: no adenopathy, no carotid bruit, no JVD, supple, symmetrical, trachea midline and thyroid not enlarged, symmetric, no tenderness/mass/nodules Back: symmetric, no curvature. ROM normal. No CVA tenderness. Lungs: clear to auscultation bilaterally Breasts: normal appearance, no masses or tenderness Heart: regular rate and rhythm, S1, S2 normal, no murmur, click, rub or gallop Abdomen: soft, non-tender; bowel sounds normal; no masses,  no organomegaly Pelvic: deferred---gyn Extremities: extremities normal, atraumatic, no cyanosis or edema Pulses: 2+ and symmetric Skin: Skin color, texture, turgor normal. No rashes or lesions Lymph nodes: Cervical, supraclavicular, and axillary nodes normal. Neurologic: Alert and oriented X 3, normal strength and tone. Normal symmetric reflexes. Normal coordination and gait   Assessment:    Healthy female exam.      Plan:    ghm utd Check labs  See After Visit Summary for Counseling Recommendations    1. Essential hypertension Well controlled, no changes to meds. Encouraged heart healthy diet such as the DASH diet and exercise as tolerated.   -  Lipid panel - Comprehensive metabolic panel  2. Primary osteoarthritis of both knees   - diclofenac Sodium (VOLTAREN) 1 % GEL; Apply 4 g topically 4 (four) times daily.  Dispense: 150 g; Refill: 2  3. Preventative health care See above

## 2019-05-21 NOTE — Patient Instructions (Signed)
Please schedule your next medicare wellness visit with me in 1 yr.  Continue to eat heart healthy diet (full of fruits, vegetables, whole grains, lean protein, water--limit salt, fat, and sugar intake) and increase physical activity as tolerated.  Continue doing brain stimulating activities (puzzles, reading, adult coloring books, staying active) to keep memory sharp.   Please schedule bone density scan and mammogram.    Mindy Anderson , Thank you for taking time to come for your Medicare Wellness Visit. I appreciate your ongoing commitment to your health goals. Please review the following plan we discussed and let me know if I can assist you in the future.   These are the goals we discussed: Goals    . DIET - INCREASE WATER INTAKE    . DIET - REDUCE SODIUM INTAKE    . Increase physical activity       This is a list of the screening recommended for you and due dates:  Health Maintenance  Topic Date Due  .  Hepatitis C: One time screening is recommended by Center for Disease Control  (CDC) for  adults born from [birth year] through 1967.   1950-12-14  . Tetanus Vaccine  02/10/2018  . Mammogram  04/25/2019  . Colon Cancer Screening  12/27/2023  . Flu Shot  Completed  . DEXA scan (bone density measurement)  Completed  . Pneumonia vaccines  Completed    Preventive Care 47 Years and Older, Female Preventive care refers to lifestyle choices and visits with your health care provider that can promote health and wellness. This includes:  A yearly physical exam. This is also called an annual well check.  Regular dental and eye exams.  Immunizations.  Screening for certain conditions.  Healthy lifestyle choices, such as diet and exercise. What can I expect for my preventive care visit? Physical exam Your health care provider will check:  Height and weight. These may be used to calculate body mass index (BMI), which is a measurement that tells if you are at a healthy weight.  Heart rate  and blood pressure.  Your skin for abnormal spots. Counseling Your health care provider may ask you questions about:  Alcohol, tobacco, and drug use.  Emotional well-being.  Home and relationship well-being.  Sexual activity.  Eating habits.  History of falls.  Memory and ability to understand (cognition).  Work and work Statistician.  Pregnancy and menstrual history. What immunizations do I need?  Influenza (flu) vaccine  This is recommended every year. Tetanus, diphtheria, and pertussis (Tdap) vaccine  You may need a Td booster every 10 years. Varicella (chickenpox) vaccine  You may need this vaccine if you have not already been vaccinated. Zoster (shingles) vaccine  You may need this after age 35. Pneumococcal conjugate (PCV13) vaccine  One dose is recommended after age 54. Pneumococcal polysaccharide (PPSV23) vaccine  One dose is recommended after age 63. Measles, mumps, and rubella (MMR) vaccine  You may need at least one dose of MMR if you were born in 1957 or later. You may also need a second dose. Meningococcal conjugate (MenACWY) vaccine  You may need this if you have certain conditions. Hepatitis A vaccine  You may need this if you have certain conditions or if you travel or work in places where you may be exposed to hepatitis A. Hepatitis B vaccine  You may need this if you have certain conditions or if you travel or work in places where you may be exposed to hepatitis B. Haemophilus influenzae type  b (Hib) vaccine  You may need this if you have certain conditions. You may receive vaccines as individual doses or as more than one vaccine together in one shot (combination vaccines). Talk with your health care provider about the risks and benefits of combination vaccines. What tests do I need? Blood tests  Lipid and cholesterol levels. These may be checked every 5 years, or more frequently depending on your overall health.  Hepatitis C  test.  Hepatitis B test. Screening  Lung cancer screening. You may have this screening every year starting at age 79 if you have a 30-pack-year history of smoking and currently smoke or have quit within the past 15 years.  Colorectal cancer screening. All adults should have this screening starting at age 30 and continuing until age 29. Your health care provider may recommend screening at age 19 if you are at increased risk. You will have tests every 1-10 years, depending on your results and the type of screening test.  Diabetes screening. This is done by checking your blood sugar (glucose) after you have not eaten for a while (fasting). You may have this done every 1-3 years.  Mammogram. This may be done every 1-2 years. Talk with your health care provider about how often you should have regular mammograms.  BRCA-related cancer screening. This may be done if you have a family history of breast, ovarian, tubal, or peritoneal cancers. Other tests  Sexually transmitted disease (STD) testing.  Bone density scan. This is done to screen for osteoporosis. You may have this done starting at age 40. Follow these instructions at home: Eating and drinking  Eat a diet that includes fresh fruits and vegetables, whole grains, lean protein, and low-fat dairy products. Limit your intake of foods with high amounts of sugar, saturated fats, and salt.  Take vitamin and mineral supplements as recommended by your health care provider.  Do not drink alcohol if your health care provider tells you not to drink.  If you drink alcohol: ? Limit how much you have to 0-1 drink a day. ? Be aware of how much alcohol is in your drink. In the [place] one drink equals one 12 oz bottle of beer (355 mL), one 5 oz glass of wine (148 mL), or one 1 oz glass of hard liquor (44 mL). Lifestyle  Take daily care of your teeth and gums.  Stay active. Exercise for at least 30 minutes on 5 or more days each week.  Do not use  any products that contain nicotine or tobacco, such as cigarettes, e-cigarettes, and chewing tobacco. If you need help quitting, ask your health care provider.  If you are sexually active, practice safe sex. Use a condom or other form of protection in order to prevent STIs (sexually transmitted infections).  Talk with your health care provider about taking a low-dose aspirin or statin. What's next?  Go to your health care provider once a year for a well check visit.  Ask your health care provider how often you should have your eyes and teeth checked.  Stay up to date on all vaccines. This information is not intended to replace advice given to you by your health care provider. Make sure you discuss any questions you have with your health care provider. Document Revised: 03/15/2018 Document Reviewed: 03/15/2018 Elsevier Patient Education  2020 [employer].

## 2019-05-21 NOTE — Patient Instructions (Signed)
Preventive Care 38 Years and Older, Female Preventive care refers to lifestyle choices and visits with your health care provider that can promote health and wellness. This includes:  A yearly physical exam. This is also called an annual well check.  Regular dental and eye exams.  Immunizations.  Screening for certain conditions.  Healthy lifestyle choices, such as diet and exercise. What can I expect for my preventive care visit? Physical exam Your health care provider will check:  Height and weight. These may be used to calculate body mass index (BMI), which is a measurement that tells if you are at a healthy weight.  Heart rate and blood pressure.  Your skin for abnormal spots. Counseling Your health care provider may ask you questions about:  Alcohol, tobacco, and drug use.  Emotional well-being.  Home and relationship well-being.  Sexual activity.  Eating habits.  History of falls.  Memory and ability to understand (cognition).  Work and work Statistician.  Pregnancy and menstrual history. What immunizations do I need?  Influenza (flu) vaccine  This is recommended every year. Tetanus, diphtheria, and pertussis (Tdap) vaccine  You may need a Td booster every 10 years. Varicella (chickenpox) vaccine  You may need this vaccine if you have not already been vaccinated. Zoster (shingles) vaccine  You may need this after age 33. Pneumococcal conjugate (PCV13) vaccine  One dose is recommended after age 33. Pneumococcal polysaccharide (PPSV23) vaccine  One dose is recommended after age 72. Measles, mumps, and rubella (MMR) vaccine  You may need at least one dose of MMR if you were born in 1957 or later. You may also need a second dose. Meningococcal conjugate (MenACWY) vaccine  You may need this if you have certain conditions. Hepatitis A vaccine  You may need this if you have certain conditions or if you travel or work in places where you may be exposed  to hepatitis A. Hepatitis B vaccine  You may need this if you have certain conditions or if you travel or work in places where you may be exposed to hepatitis B. Haemophilus influenzae type b (Hib) vaccine  You may need this if you have certain conditions. You may receive vaccines as individual doses or as more than one vaccine together in one shot (combination vaccines). Talk with your health care provider about the risks and benefits of combination vaccines. What tests do I need? Blood tests  Lipid and cholesterol levels. These may be checked every 5 years, or more frequently depending on your overall health.  Hepatitis C test.  Hepatitis B test. Screening  Lung cancer screening. You may have this screening every year starting at age 39 if you have a 30-pack-year history of smoking and currently smoke or have quit within the past 15 years.  Colorectal cancer screening. All adults should have this screening starting at age 36 and continuing until age 15. Your health care provider may recommend screening at age 23 if you are at increased risk. You will have tests every 1-10 years, depending on your results and the type of screening test.  Diabetes screening. This is done by checking your blood sugar (glucose) after you have not eaten for a while (fasting). You may have this done every 1-3 years.  Mammogram. This may be done every 1-2 years. Talk with your health care provider about how often you should have regular mammograms.  BRCA-related cancer screening. This may be done if you have a family history of breast, ovarian, tubal, or peritoneal cancers.  Other tests  Sexually transmitted disease (STD) testing.  Bone density scan. This is done to screen for osteoporosis. You may have this done starting at age 44. Follow these instructions at home: Eating and drinking  Eat a diet that includes fresh fruits and vegetables, whole grains, lean protein, and low-fat dairy products. Limit  your intake of foods with high amounts of sugar, saturated fats, and salt.  Take vitamin and mineral supplements as recommended by your health care provider.  Do not drink alcohol if your health care provider tells you not to drink.  If you drink alcohol: ? Limit how much you have to 0-1 drink a day. ? Be aware of how much alcohol is in your drink. In the [place] one drink equals one 12 oz bottle of beer (355 mL), one 5 oz glass of wine (148 mL), or one 1 oz glass of hard liquor (44 mL). Lifestyle  Take daily care of your teeth and gums.  Stay active. Exercise for at least 30 minutes on 5 or more days each week.  Do not use any products that contain nicotine or tobacco, such as cigarettes, e-cigarettes, and chewing tobacco. If you need help quitting, ask your health care provider.  If you are sexually active, practice safe sex. Use a condom or other form of protection in order to prevent STIs (sexually transmitted infections).  Talk with your health care provider about taking a low-dose aspirin or statin. What's next?  Go to your health care provider once a year for a well check visit.  Ask your health care provider how often you should have your eyes and teeth checked.  Stay up to date on all vaccines. This information is not intended to replace advice given to you by your health care provider. Make sure you discuss any questions you have with your health care provider. Document Revised: 03/15/2018 Document Reviewed: 03/15/2018 Elsevier Patient Education  2020 [employer].

## 2019-06-19 NOTE — Assessment & Plan Note (Signed)
ghm utd Check labs  See above 

## 2019-07-01 ENCOUNTER — Encounter: Payer: Self-pay | Admitting: Family Medicine

## 2019-07-01 ENCOUNTER — Other Ambulatory Visit: Payer: Self-pay

## 2019-07-01 ENCOUNTER — Other Ambulatory Visit (HOSPITAL_COMMUNITY)
Admission: RE | Admit: 2019-07-01 | Discharge: 2019-07-01 | Disposition: A | Discharge status: Home / Self Care | Payer: Medicare HMO | Source: Ambulatory Visit | Attending: Family Medicine | Admitting: Family Medicine

## 2019-07-01 ENCOUNTER — Ambulatory Visit (INDEPENDENT_AMBULATORY_CARE_PROVIDER_SITE_OTHER): Payer: Medicare HMO | Admitting: Family Medicine

## 2019-07-01 VITALS — BP 130/80 | HR 79 | Temp 97.7°F | Resp 18 | Ht 64.0 in | Wt 208.4 lb

## 2019-07-01 DIAGNOSIS — N898 Other specified noninflammatory disorders of vagina: Secondary | ICD-10-CM | POA: Diagnosis not present

## 2019-07-01 DIAGNOSIS — B354 Tinea corporis: Secondary | ICD-10-CM | POA: Diagnosis not present

## 2019-07-01 DIAGNOSIS — N760 Acute vaginitis: Secondary | ICD-10-CM

## 2019-07-01 LAB — POC URINALSYSI DIPSTICK (AUTOMATED)
Bilirubin, UA: NEGATIVE
Blood, UA: NEGATIVE
Glucose, UA: NEGATIVE
Ketones, UA: NEGATIVE
Leukocytes, UA: NEGATIVE
Nitrite, UA: NEGATIVE
Protein, UA: NEGATIVE
Spec Grav, UA: 1.02 (ref 1.010–1.025)
Urobilinogen, UA: 0.2 E.U./dL
pH, UA: 6.5 (ref 5.0–8.0)

## 2019-07-01 MED ORDER — FLUCONAZOLE 150 MG PO TABS: ORAL_TABLET | ORAL | 0 refills | Status: DC

## 2019-07-01 MED ORDER — NYSTATIN 100000 UNIT/GM EX CREA: 1.0000 "application " | TOPICAL_CREAM | Freq: Two times a day (BID) | CUTANEOUS | 0 refills | Status: DC

## 2019-07-01 MED FILL — NYSTATIN 100,000 UNIT/GM CR: 100000 | 10 days supply | Qty: 30 | Fill #0

## 2019-07-01 MED FILL — FLUCONAZOLE 150 MG TABS: 150 | 2 days supply | Qty: 2 | Fill #0

## 2019-07-01 NOTE — Progress Notes (Signed)
Patient ID: Mindy Anderson [id number], female    DOB: [birth year]  Age: 69 y.o. MRN: [id number]    Subjective:  Subjective  HPI Mindy Anderson [id number] presents for rash in groin and under breasts  She also c/o white vaginal d/c.  + itchy  Review of Systems  Constitutional: Negative for appetite change, diaphoresis, fatigue and unexpected weight change.  Eyes: Negative for pain, redness and visual disturbance.  Respiratory: Negative for cough, chest tightness, shortness of breath and wheezing.   Cardiovascular: Negative for chest pain, palpitations and leg swelling.  Endocrine: Negative for cold intolerance, heat intolerance, polydipsia, polyphagia and polyuria.  Genitourinary: Positive for vaginal discharge. Negative for difficulty urinating, dysuria and frequency.  Skin: Positive for color change and rash.  Neurological: Negative for dizziness, light-headedness, numbness and headaches.    History Past Medical History:  Diagnosis Date  . Arthritis    hands, knees   . Complication of anesthesia   . Family history of adverse reaction to anesthesia    mother of pt. wakes slowly  . Hypertension   . PONV (postoperative nausea and vomiting)     She has a past surgical history that includes Band Aid surgery (1980's); Tubal ligation; and Cholecystectomy (N/A, 06/25/2014).   Her family history includes Breast cancer in her mother and sister; COPD in her father; Cancer in her brother and mother; Cervical cancer in her mother; Colon cancer in her sister; Colon polyps in her daughter; Heart attack (age of onset: 29) in her brother; Heart disease in her brother and brother; Heart disease (age of onset: 16) in her father; Heart disease (age of onset: 73) in her mother; Liver cancer in her sister; Lung cancer in her sister; Uterine cancer in her mother.She reports that she has never smoked. She has never used smokeless tobacco. She reports that she does not drink alcohol or use drugs.  Current Outpatient  Medications on File Prior to Visit  Medication Sig Dispense Refill  . amLODipine (NORVASC) 5 MG tablet TAKE 1 TABLET EVERY DAY 90 tablet 1  . aspirin 81 MG tablet Take 81 mg by mouth daily.      . Calcium Carbonate-Vit D-Min (CALCIUM 1200 PO) Take by mouth.    . Cholecalciferol (D3 HIGH POTENCY) 50 MCG (2000 UT) CAPS Take by mouth.    . diclofenac Sodium (VOLTAREN) 1 % GEL Apply 4 g topically 4 (four) times daily. 150 g 2  . Fluticasone Propionate (FLONASE NA) Place into the nose.    . ibuprofen (ADVIL,MOTRIN) 400 MG tablet Take 400 mg by mouth every 6 (six) hours as needed for mild pain.    Marland Kitchen lisinopril-hydrochlorothiazide (ZESTORETIC) 10-12.5 MG tablet Take 1 tablet by mouth daily. 90 tablet 1  . naproxen sodium (ALEVE) 220 MG tablet Take 220 mg by mouth daily as needed.    . TURMERIC PO Take by mouth.     No current facility-administered medications on file prior to visit.     Objective:  Objective  Physical Exam Vitals and nursing note reviewed.  Constitutional:      Appearance: She is well-developed.  HENT:     Head: Normocephalic and atraumatic.  Eyes:     Conjunctiva/sclera: Conjunctivae normal.  Neck:     Thyroid: No thyromegaly.     Vascular: No carotid bruit or JVD.  Cardiovascular:     Rate and Rhythm: Normal rate and regular rhythm.     Heart sounds: Normal heart sounds. No murmur.  Pulmonary:     Effort:  Pulmonary effort is normal. No respiratory distress.     Breath sounds: Normal breath sounds. No wheezing or rales.  Chest:     Chest wall: No tenderness.  Musculoskeletal:     Cervical back: Normal range of motion and neck supple.  Skin:    Findings: Erythema and rash present.       Neurological:     Mental Status: She is alert and oriented to person, place, and time.    BP 130/80 (BP Location: Right Arm, Patient Position: Sitting, Cuff Size: Large)   Pulse 79   Temp 97.7 F (36.5 C) (Temporal)   Resp 18   Ht 5\' 4"  (1.626 m)   Wt 208 lb 6.4 oz (94.5  kg)   SpO2 97%   BMI 35.77 kg/m  Wt Readings from Last 3 Encounters:  07/01/19 208 lb 6.4 oz (94.5 kg)  05/21/19 208 lb (94.3 kg)  01/14/19 195 lb 6.4 oz (88.6 kg)     Lab Results  Component Value Date   WBC 7.2 12/31/2018   HGB 13.8 12/31/2018   HCT 41.2 12/31/2018   PLT 201.0 12/31/2018   GLUCOSE 105 (H) 05/21/2019   CHOL 155 05/21/2019   TRIG 58.0 05/21/2019   HDL 54.60 05/21/2019   LDLCALC 89 05/21/2019   ALT 16 05/21/2019   AST 16 05/21/2019   NA 140 05/21/2019   K 4.3 05/21/2019   CL 102 05/21/2019   CREATININE 0.73 05/21/2019   BUN 12 05/21/2019   CO2 32 05/21/2019   TSH 1.54 04/02/2012   MICROALBUR 0.8 04/02/2012    DG Shoulder Right  Result Date: 05/17/2018 CLINICAL DATA:  Patient injured her right shoulder playing with her grandchildren 6 months ago. Persistent pain. EXAM: RIGHT SHOULDER - 2+ VIEW COMPARISON:  None. FINDINGS: No fracture or bone lesion. Glenohumeral joint normally spaced and aligned. Mild narrowing of the [facility] joint with minor marginal osteophytes. Bones are demineralized. Surrounding soft tissues are unremarkable. IMPRESSION: 1. No fracture or acute finding. 2. Mild AC joint osteoarthritis. Electronically Signed   By: Lajean Manes M.D.   On: 05/17/2018 16:58     Assessment & Plan:  Plan  I am having [place] start on fluconazole and nystatin cream. I am also having her maintain her aspirin, ibuprofen, naproxen sodium, TURMERIC PO, Calcium Carbonate-Vit D-Min (CALCIUM 1200 PO), Cholecalciferol, Fluticasone Propionate (FLONASE NA), lisinopril-hydrochlorothiazide, amLODipine, and diclofenac Sodium.  Meds ordered this encounter  Medications  . fluconazole (DIFLUCAN) 150 MG tablet    Sig: 1 po x1, may repeat in 3 days prn    Dispense:  2 tablet    Refill:  0  . nystatin cream (MYCOSTATIN)    Sig: Apply 1 application topically 2 (two) times daily.    Dispense:  30 g    Refill:  0    Problem List Items Addressed This Visit    None      Visit Diagnoses    Vaginal itching    -  Primary   Relevant Orders   Urine cytology ancillary only([facility])   POCT Urinalysis Dipstick (Automated) (Completed)   Tinea corporis       Relevant Medications   fluconazole (DIFLUCAN) 150 MG tablet   nystatin cream (MYCOSTATIN)   Acute vaginitis       Relevant Medications   fluconazole (DIFLUCAN) 150 MG tablet      Urine ancillary pending   Follow-up: Return in about 5 months (around 12/01/2019), or if symptoms worsen or fail to  improve, for hypertension.  Ann Held, DO

## 2019-07-01 NOTE — Patient Instructions (Signed)

## 2019-07-02 LAB — URINE CYTOLOGY ANCILLARY ONLY
Bacterial Vaginitis (gardnerella): NEGATIVE
Candida Glabrata: NEGATIVE
Candida Vaginitis: NEGATIVE
Chlamydia: NEGATIVE
Comment: NEGATIVE
Comment: NEGATIVE
Comment: NEGATIVE
Comment: NEGATIVE
Comment: NEGATIVE
Comment: NORMAL
Neisseria Gonorrhea: NEGATIVE
Trichomonas: NEGATIVE

## 2019-07-04 LAB — URINE CYTOLOGY ANCILLARY ONLY
Bacterial vaginitis: NEGATIVE
Candida vaginitis: NEGATIVE

## 2019-07-15 ENCOUNTER — Other Ambulatory Visit: Payer: Self-pay | Admitting: Family Medicine

## 2019-07-15 DIAGNOSIS — I10 Essential (primary) hypertension: Secondary | ICD-10-CM

## 2019-07-23 ENCOUNTER — Other Ambulatory Visit: Payer: Self-pay | Admitting: Family Medicine

## 2019-07-23 DIAGNOSIS — Z1231 Encounter for screening mammogram for malignant neoplasm of breast: Secondary | ICD-10-CM

## 2019-12-20 ENCOUNTER — Other Ambulatory Visit: Payer: Self-pay | Admitting: Family Medicine

## 2019-12-20 DIAGNOSIS — I10 Essential (primary) hypertension: Secondary | ICD-10-CM

## 2019-12-23 ENCOUNTER — Telehealth: Payer: Self-pay | Admitting: Family Medicine

## 2019-12-23 MED ORDER — AMLODIPINE BESYLATE 5 MG PO TABS: 5.0000 mg | ORAL_TABLET | Freq: Every day | ORAL | 0 refills | Status: DC

## 2019-12-23 NOTE — Telephone Encounter (Signed)
Refill sent.

## 2019-12-23 NOTE — Telephone Encounter (Signed)
Medication: amLODipine (NORVASC) 5 MG tablet [[id number]]       Has the patient contacted their pharmacy?  (If no, request that the patient contact the pharmacy for the refill.) (If yes, when and what did the pharmacy advise?)     Preferred Pharmacy (with phone number or street name): [facility] [address]  [place], [place] [address]  Phone:  [phone] Fax:  [phone]      Agent: Please be advised that RX refills may take up to 3 business days. We ask that you follow-up with your pharmacy.

## 2019-12-31 MED ORDER — AMLODIPINE BESYLATE 5 MG PO TABS: 5.0000 mg | ORAL_TABLET | Freq: Every day | ORAL | 0 refills | Status: DC

## 2019-12-31 NOTE — Telephone Encounter (Signed)
Refill sent to correct to pharmacy

## 2019-12-31 NOTE — Addendum Note (Signed)
Addended by: Sanda Linger on: 12/31/2019 11:46 AM   Modules accepted: Orders

## 2019-12-31 NOTE — Telephone Encounter (Signed)
Medication was sent to the wrong pharmacy please re-send to [facility]. Patient would like a 90 days supply.

## 2020-01-23 ENCOUNTER — Ambulatory Visit (INDEPENDENT_AMBULATORY_CARE_PROVIDER_SITE_OTHER): Payer: Medicare HMO | Admitting: Family Medicine

## 2020-01-23 ENCOUNTER — Other Ambulatory Visit: Payer: Self-pay

## 2020-01-23 ENCOUNTER — Encounter: Payer: Self-pay | Admitting: Family Medicine

## 2020-01-23 ENCOUNTER — Other Ambulatory Visit: Payer: Self-pay | Admitting: Family Medicine

## 2020-01-23 ENCOUNTER — Ambulatory Visit (HOSPITAL_BASED_OUTPATIENT_CLINIC_OR_DEPARTMENT_OTHER)
Admission: RE | Admit: 2020-01-23 | Discharge: 2020-01-23 | Disposition: A | Discharge status: Home / Self Care | Payer: Medicare HMO | Source: Ambulatory Visit | Attending: Family Medicine | Admitting: Family Medicine

## 2020-01-23 VITALS — BP 128/72 | HR 76 | Temp 98.5°F | Resp 18 | Ht 64.0 in | Wt 201.4 lb

## 2020-01-23 DIAGNOSIS — M545 Low back pain, unspecified: Secondary | ICD-10-CM

## 2020-01-23 DIAGNOSIS — I10 Essential (primary) hypertension: Secondary | ICD-10-CM

## 2020-01-23 DIAGNOSIS — M25551 Pain in right hip: Secondary | ICD-10-CM | POA: Diagnosis not present

## 2020-01-23 DIAGNOSIS — R768 Other specified abnormal immunological findings in serum: Secondary | ICD-10-CM | POA: Diagnosis not present

## 2020-01-23 DIAGNOSIS — Z23 Encounter for immunization: Secondary | ICD-10-CM | POA: Diagnosis not present

## 2020-01-23 MED ORDER — AMLODIPINE BESYLATE 5 MG PO TABS: 5.0000 mg | ORAL_TABLET | Freq: Every day | ORAL | 3 refills | Status: DC

## 2020-01-23 MED ORDER — MELOXICAM 7.5 MG PO TABS: ORAL_TABLET | ORAL | 1 refills | Status: DC

## 2020-01-23 MED ORDER — LISINOPRIL-HYDROCHLOROTHIAZIDE 10-12.5 MG PO TABS: 1.0000 | ORAL_TABLET | Freq: Every day | ORAL | 3 refills | Status: DC

## 2020-01-23 MED FILL — MELOXICAM 7.5 MG TABLET: 7.5 | 30 days supply | Qty: 60 | Fill #0

## 2020-01-23 NOTE — Progress Notes (Signed)
Patient ID: Mindy Anderson [id number], female    DOB: 1950-12-01  Age: 69 y.o. MRN: [id number]    Subjective:  Subjective  HPI Mindy Anderson [id number] presents for f/u bp and c/o low back pain and hip pain on r ---- about 4 weeks ago a chair gave way and she fell to floor -- it is not improving  When she taking naprosyn with some relief  --- it hurts to walk  She also donated blood a while back and was told her rpr was positive ---- but then got a letter saying it was neg and to see her pcp.  No other problems .    Review of Systems  Constitutional: Negative for appetite change, diaphoresis, fatigue and unexpected weight change.  Eyes: Negative for pain, redness and visual disturbance.  Respiratory: Negative for cough, chest tightness, shortness of breath and wheezing.   Cardiovascular: Negative for chest pain, palpitations and leg swelling.  Endocrine: Negative for cold intolerance, heat intolerance, polydipsia, polyphagia and polyuria.  Genitourinary: Negative for difficulty urinating, dysuria and frequency.  Musculoskeletal: Positive for arthralgias, back pain and gait problem.  Neurological: Negative for dizziness, light-headedness, numbness and headaches.    History Past Medical History:  Diagnosis Date  . Arthritis    hands, knees   . Complication of anesthesia   . Family history of adverse reaction to anesthesia    mother of pt. wakes slowly  . Hypertension   . PONV (postoperative nausea and vomiting)     She has a past surgical history that includes Band Aid surgery (1980's); Tubal ligation; and Cholecystectomy (N/A, 06/25/2014).   Her family history includes Breast cancer in her mother and sister; COPD in her father; Cancer in her brother and mother; Cervical cancer in her mother; Colon cancer in her sister; Colon polyps in her daughter; Heart attack (age of onset: 82) in her brother; Heart disease in her brother and brother; Heart disease (age of onset: 73) in her father; Heart disease (age  of onset: 70) in her mother; Liver cancer in her sister; Lung cancer in her sister; Uterine cancer in her mother.She reports that she has never smoked. She has never used smokeless tobacco. She reports that she does not drink alcohol and does not use drugs.  Current Outpatient Medications on File Prior to Visit  Medication Sig Dispense Refill  . aspirin 81 MG tablet Take 81 mg by mouth daily.      . Calcium Carbonate-Vit D-Min (CALCIUM 1200 PO) Take by mouth.    . Cholecalciferol (D3 HIGH POTENCY) 50 MCG (2000 UT) CAPS Take by mouth.    . Fluticasone Propionate (FLONASE NA) Place into the nose.    . nystatin cream (MYCOSTATIN) Apply 1 application topically 2 (two) times daily. 30 g 0  . TURMERIC PO Take by mouth.     No current facility-administered medications on file prior to visit.     Objective:  Objective  Physical Exam Vitals and nursing note reviewed.  Constitutional:      Appearance: She is well-developed.  HENT:     Head: Normocephalic and atraumatic.  Eyes:     Conjunctiva/sclera: Conjunctivae normal.  Neck:     Thyroid: No thyromegaly.     Vascular: No carotid bruit or JVD.  Cardiovascular:     Rate and Rhythm: Normal rate and regular rhythm.     Heart sounds: Normal heart sounds. No murmur heard.   Pulmonary:     Effort: Pulmonary effort is normal. No respiratory distress.  Breath sounds: Normal breath sounds. No wheezing or rales.  Chest:     Chest wall: No tenderness.  Musculoskeletal:     Cervical back: Normal range of motion and neck supple.  Neurological:     Mental Status: She is alert and oriented to person, place, and time.    BP 128/72 (BP Location: Right Arm, Patient Position: Sitting, Cuff Size: Large)   Pulse 76   Temp 98.5 F (36.9 C) (Oral)   Resp 18   Ht 5\' 4"  (1.626 m)   Wt 201 lb 6.4 oz (91.4 kg)   BMI 34.57 kg/m  Wt Readings from Last 3 Encounters:  01/23/20 201 lb 6.4 oz (91.4 kg)  07/01/19 208 lb 6.4 oz (94.5 kg)  05/21/19 208  lb (94.3 kg)     Lab Results  Component Value Date   WBC 7.2 12/31/2018   HGB 13.8 12/31/2018   HCT 41.2 12/31/2018   PLT 201.0 12/31/2018   GLUCOSE 105 (H) 05/21/2019   CHOL 155 05/21/2019   TRIG 58.0 05/21/2019   HDL 54.60 05/21/2019   LDLCALC 89 05/21/2019   ALT 16 05/21/2019   AST 16 05/21/2019   NA 140 05/21/2019   K 4.3 05/21/2019   CL 102 05/21/2019   CREATININE 0.73 05/21/2019   BUN 12 05/21/2019   CO2 32 05/21/2019   TSH 1.54 04/02/2012   MICROALBUR 0.8 04/02/2012    No results found.   Assessment & Plan:  Plan  I have discontinued Mindy Anderson. Mindy Anderson's ibuprofen, naproxen sodium, diclofenac Sodium, and fluconazole. I have also changed her lisinopril-hydrochlorothiazide. Additionally, I am having her start on meloxicam. Lastly, I am having her maintain her aspirin, TURMERIC PO, Calcium Carbonate-Vit D-Min (CALCIUM 1200 PO), Cholecalciferol, Fluticasone Propionate (FLONASE NA), nystatin cream, and amLODipine.  Meds ordered this encounter  Medications  . amLODipine (NORVASC) 5 MG tablet    Sig: Take 1 tablet (5 mg total) by mouth daily. Pt needs a OV for further refills    Dispense:  90 tablet    Refill:  3  . lisinopril-hydrochlorothiazide (ZESTORETIC) 10-12.5 MG tablet    Sig: Take 1 tablet by mouth daily.    Dispense:  90 tablet    Refill:  3  . meloxicam (MOBIC) 7.5 MG tablet    Sig: 1-2 po qd prn    Dispense:  60 tablet    Refill:  1    Problem List Items Addressed This Visit      Unprioritized   Essential hypertension - Primary    Well controlled, no changes to meds. Encouraged heart healthy diet such as the DASH diet and exercise as tolerated.  con't lisinopril and norvasc       Relevant Medications   amLODipine (NORVASC) 5 MG tablet   lisinopril-hydrochlorothiazide (ZESTORETIC) 10-12.5 MG tablet   Low back pain with radiation    mobic--- f/u ortho prn       Relevant Medications   meloxicam (MOBIC) 7.5 MG tablet   Other Relevant Orders    DG Lumbar Spine Complete   Right hip pain    mobic sent to pharmacy----f/u ortho       Relevant Medications   meloxicam (MOBIC) 7.5 MG tablet   Other Relevant Orders   DG Hip Unilat W OR W/O Pelvis 2-3 Views Right    Other Visit Diagnoses    Need for influenza vaccination       Relevant Orders   Flu Vaccine QUAD High Dose(Fluad) (Completed)   Biological false  positive RPR test       Relevant Orders   RPR   Primary hypertension       Relevant Medications   amLODipine (NORVASC) 5 MG tablet   lisinopril-hydrochlorothiazide (ZESTORETIC) 10-12.5 MG tablet   Other Relevant Orders   Microalbumin / creatinine urine ratio   Lipid panel   Comprehensive metabolic panel      Follow-up: Return in about 6 months (around 07/23/2020), or if symptoms worsen or fail to improve, for annual exam, fasting.  Ann Held, DO

## 2020-01-23 NOTE — Assessment & Plan Note (Signed)
mobic--- f/u ortho prn

## 2020-01-23 NOTE — Assessment & Plan Note (Signed)
mobic sent to pharmacy----f/u ortho

## 2020-01-23 NOTE — Patient Instructions (Addendum)
1.  We gave you your flu shot today 2. Have labs done today 3 get xrays on the first floor in radiology  4. Pick up your prescriptions in the pharmacy downstairs      [place] stands for "Dietary Approaches to Stop Hypertension." The DASH eating plan is a healthy eating plan that has been shown to reduce high blood pressure (hypertension). It may also reduce your risk for type 2 diabetes, heart disease, and stroke. The DASH eating plan may also help with weight loss. What are tips for following this plan?  General guidelines  Avoid eating more than 2,300 mg (milligrams) of salt (sodium) a day. If you have hypertension, you may need to reduce your sodium intake to 1,500 mg a day.  Limit alcohol intake to no more than 1 drink a day for nonpregnant women and 2 drinks a day for men. One drink equals 12 oz of beer, 5 oz of wine, or 1 oz of hard liquor.  Work with your health care provider to maintain a healthy body weight or to lose weight. Ask what an ideal weight is for you.  Get at least 30 minutes of exercise that causes your heart to beat faster (aerobic exercise) most days of the week. Activities may include walking, swimming, or biking.  Work with your health care provider or diet and nutrition specialist (dietitian) to adjust your eating plan to your individual calorie needs. Reading food labels   Check food labels for the amount of sodium per serving. Choose foods with less than 5 percent of the Daily Value of sodium. Generally, foods with less than 300 mg of sodium per serving fit into this eating plan.  To find whole grains, look for the word "whole" as the first word in the ingredient list. Shopping  Buy products labeled as "low-sodium" or "no salt added."  Buy fresh foods. Avoid canned foods and premade or frozen meals. Cooking  Avoid adding salt when cooking. Use salt-free seasonings or herbs instead of table salt or sea salt. Check with your health care  provider or pharmacist before using salt substitutes.  Do not fry foods. Cook foods using healthy methods such as baking, boiling, grilling, and broiling instead.  Cook with heart-healthy oils, such as olive, canola, soybean, or sunflower oil. Meal planning  Eat a balanced diet that includes: ? 5 or more servings of fruits and vegetables each day. At each meal, try to fill half of your plate with fruits and vegetables. ? Up to 6-8 servings of whole grains each day. ? Less than 6 oz of lean meat, poultry, or fish each day. A 3-oz serving of meat is about the same size as a deck of cards. One egg equals 1 oz. ? 2 servings of low-fat dairy each day. ? A serving of nuts, seeds, or beans 5 times each week. ? Heart-healthy fats. Healthy fats called Omega-3 fatty acids are found in foods such as flaxseeds and coldwater fish, like sardines, salmon, and mackerel.  Limit how much you eat of the following: ? Canned or prepackaged foods. ? Food that is high in trans fat, such as fried foods. ? Food that is high in saturated fat, such as fatty meat. ? Sweets, desserts, sugary drinks, and other foods with added sugar. ? Full-fat dairy products.  Do not salt foods before eating.  Try to eat at least 2 vegetarian meals each week.  Eat more home-cooked food and less restaurant, buffet, and fast food.  When eating at a restaurant, ask that your food be prepared with less salt or no salt, if possible. What foods are recommended? The items listed may not be a complete list. Talk with your dietitian about what dietary choices are best for you. Grains Whole-grain or whole-wheat bread. Whole-grain or whole-wheat pasta. Brown rice. [place]. Bulgur. Whole-grain and low-sodium cereals. Pita bread. Low-fat, low-sodium crackers. Whole-wheat flour tortillas. Vegetables Fresh or frozen vegetables (raw, steamed, roasted, or grilled). Low-sodium or reduced-sodium tomato and vegetable juice. Low-sodium or  reduced-sodium tomato sauce and tomato paste. Low-sodium or reduced-sodium canned vegetables. Fruits All fresh, dried, or frozen fruit. Canned fruit in natural juice (without added sugar). Meat and other protein foods Skinless chicken or [place]. Ground chicken or [place]. Pork with fat trimmed off. Fish and seafood. Egg whites. Dried beans, peas, or lentils. Unsalted nuts, nut butters, and seeds. Unsalted canned beans. Lean cuts of beef with fat trimmed off. Low-sodium, lean deli meat. Dairy Low-fat (1%) or fat-free (skim) milk. Fat-free, low-fat, or reduced-fat cheeses. Nonfat, low-sodium ricotta or cottage cheese. Low-fat or nonfat yogurt. Low-fat, low-sodium cheese. Fats and oils Soft margarine without trans fats. Vegetable oil. Low-fat, reduced-fat, or light mayonnaise and salad dressings (reduced-sodium). Canola, safflower, olive, soybean, and sunflower oils. Avocado. Seasoning and other foods Herbs. Spices. Seasoning mixes without salt. Unsalted popcorn and pretzels. Fat-free sweets. What foods are not recommended? The items listed may not be a complete list. Talk with your dietitian about what dietary choices are best for you. Grains Baked goods made with fat, such as croissants, muffins, or some breads. Dry pasta or rice meal packs. Vegetables Creamed or fried vegetables. Vegetables in a cheese sauce. Regular canned vegetables (not low-sodium or reduced-sodium). Regular canned tomato sauce and paste (not low-sodium or reduced-sodium). Regular tomato and vegetable juice (not low-sodium or reduced-sodium). Angie Fava. Olives. Fruits Canned fruit in a light or heavy syrup. Fried fruit. Fruit in cream or butter sauce. Meat and other protein foods Fatty cuts of meat. Ribs. Fried meat. Berniece Salines. Sausage. Bologna and other processed lunch meats. Salami. Fatback. Hotdogs. Bratwurst. Salted nuts and seeds. Canned beans with added salt. Canned or smoked fish. Whole eggs or egg yolks. Chicken or [place]  with skin. Dairy Whole or 2% milk, cream, and half-and-half. Whole or full-fat cream cheese. Whole-fat or sweetened yogurt. Full-fat cheese. Nondairy creamers. Whipped toppings. Processed cheese and cheese spreads. Fats and oils Butter. Stick margarine. Lard. Shortening. Ghee. Bacon fat. Tropical oils, such as coconut, palm kernel, or palm oil. Seasoning and other foods Salted popcorn and pretzels. Onion salt, garlic salt, seasoned salt, table salt, and sea salt. Worcestershire sauce. Tartar sauce. Barbecue sauce. Teriyaki sauce. Soy sauce, including reduced-sodium. Steak sauce. Canned and packaged gravies. Fish sauce. Oyster sauce. Cocktail sauce. Horseradish that you find on the shelf. Ketchup. Mustard. Meat flavorings and tenderizers. Bouillon cubes. Hot sauce and Tabasco sauce. Premade or packaged marinades. Premade or packaged taco seasonings. Relishes. Regular salad dressings. Where to find more information:  National Heart, Lung, and [place]: https://wilson-eaton.com/  American Heart Association: www.heart.org Summary  The DASH eating plan is a healthy eating plan that has been shown to reduce high blood pressure (hypertension). It may also reduce your risk for type 2 diabetes, heart disease, and stroke.  With the DASH eating plan, you should limit salt (sodium) intake to 2,300 mg a day. If you have hypertension, you may need to reduce your sodium intake to 1,500 mg a day.  When on the DASH eating plan,  aim to eat more fresh fruits and vegetables, whole grains, lean proteins, low-fat dairy, and heart-healthy fats.  Work with your health care provider or diet and nutrition specialist (dietitian) to adjust your eating plan to your individual calorie needs. This information is not intended to replace advice given to you by your health care provider. Make sure you discuss any questions you have with your health care provider. Document Revised: 03/03/2017 Document Reviewed:  03/14/2016 Elsevier Patient Education  2020 [employer].

## 2020-01-23 NOTE — Assessment & Plan Note (Signed)
Well controlled, no changes to meds. Encouraged heart healthy diet such as the DASH diet and exercise as tolerated.  con't lisinopril and norvasc

## 2020-01-24 LAB — MICROALBUMIN / CREATININE URINE RATIO
Creatinine, Urine: 72 mg/dL (ref 20–275)
Microalb Creat Ratio: 8 mcg/mg creat (ref <30)
Microalb, Ur: 0.6 mg/dL

## 2020-01-24 LAB — COMPREHENSIVE METABOLIC PANEL
AG Ratio: 1.3 (calc) (ref 1.0–2.5)
ALT: 16 U/L (ref 6–29)
AST: 20 U/L (ref 10–35)
Albumin: 4.3 g/dL (ref 3.6–5.1)
Alkaline phosphatase (APISO): 85 U/L (ref 37–153)
BUN: 12 mg/dL (ref 7–25)
CO2: 30 mmol/L (ref 20–32)
Calcium: 9.9 mg/dL (ref 8.6–10.4)
Chloride: 101 mmol/L (ref 98–110)
Creat: 0.79 mg/dL (ref 0.60–0.93)
Globulin: 3.4 g/dL (calc) (ref 1.9–3.7)
Glucose, Bld: 106 mg/dL — ABNORMAL HIGH (ref 65–99)
Potassium: 3.9 mmol/L (ref 3.5–5.3)
Sodium: 140 mmol/L (ref 135–146)
Total Bilirubin: 0.5 mg/dL (ref 0.2–1.2)
Total Protein: 7.7 g/dL (ref 6.1–8.1)

## 2020-01-24 LAB — LIPID PANEL
Cholesterol: 173 mg/dL (ref <200)
HDL: 58 mg/dL (ref >=50)
LDL Cholesterol (Calc): 97 mg/dL (calc)
Non-HDL Cholesterol (Calc): 115 mg/dL (calc) (ref <130)
Total CHOL/HDL Ratio: 3 (calc) (ref <5.0)
Triglycerides: 86 mg/dL (ref <150)

## 2020-01-24 LAB — RPR: RPR Ser Ql: NONREACTIVE

## 2020-01-24 LAB — EXTRA LAV TOP TUBE

## 2020-02-17 ENCOUNTER — Telehealth: Payer: Self-pay | Admitting: Family Medicine

## 2020-02-17 DIAGNOSIS — M25551 Pain in right hip: Secondary | ICD-10-CM

## 2020-02-17 DIAGNOSIS — M545 Low back pain, unspecified: Secondary | ICD-10-CM

## 2020-02-17 MED ORDER — MELOXICAM 7.5 MG PO TABS: ORAL_TABLET | ORAL | 1 refills | Status: DC

## 2020-02-17 NOTE — Telephone Encounter (Signed)
Medication: meloxicam (MOBIC) 7.5 MG tablet [[id number]]    Has the patient contacted their pharmacy? No. (If no, request that the patient contact the pharmacy for the refill.) (If yes, when and what did the pharmacy advise?)  Preferred Pharmacy (with phone number or street name): [facility], [address]  [place], [place] [address]  Phone:  [phone] Fax:  [phone]   Agent: Please be advised that RX refills may take up to 3 business days. We ask that you follow-up with your pharmacy.

## 2020-02-17 NOTE — Telephone Encounter (Signed)
Refill sent.

## 2020-07-23 ENCOUNTER — Encounter: Payer: Medicare HMO | Admitting: Family Medicine

## 2020-07-28 ENCOUNTER — Encounter: Payer: Medicare HMO | Admitting: Family Medicine

## 2020-08-05 ENCOUNTER — Other Ambulatory Visit: Payer: Self-pay

## 2020-08-05 NOTE — Progress Notes (Signed)
Subjective:   Mindy Anderson is a 70 y.o. female who presents for Medicare Annual (Subsequent) preventive examination.  Review of Systems     Cardiac Risk Factors include: advanced age (>6men, >61 women);hypertension;sedentary lifestyle;obesity (BMI >30kg/m2)     Objective:    Today's Vitals   08/06/20 0952  BP: 128/80  Pulse: 79  Resp: 18  Temp: 98.1 F (36.7 C)  TempSrc: Temporal  SpO2: 97%  Weight: 203 lb (92.1 kg)  Height: 5\' 4"  (1.626 m)   Body mass index is 34.84 kg/m.  Advanced Directives 08/06/2020 05/21/2019 05/17/2018 04/21/2017 04/11/2016 06/19/2014 05/27/2014  Does Patient Have a Medical Advance Directive? No No No Yes No No No  Does patient want to make changes to medical advance directive? - - - No - Patient declined Yes (MAU/Ambulatory/Procedural Areas - Information given) - -  Would patient like information on creating a medical advance directive? No - Patient declined No - Patient declined No - Patient declined - - Yes - Educational materials given -    Current Medications (verified) Outpatient Encounter Medications as of 08/06/2020  Medication Sig  . amLODipine (NORVASC) 5 MG tablet Take 1 tablet (5 mg total) by mouth daily. Pt needs a OV for further refills  . aspirin 81 MG tablet Take 81 mg by mouth daily.  . Fluticasone Propionate (FLONASE NA) Place into the nose.  Marland Kitchen lisinopril-hydrochlorothiazide (ZESTORETIC) 10-12.5 MG tablet Take 1 tablet by mouth daily.  . meloxicam (MOBIC) 7.5 MG tablet 1-2 po qd prn  . [DISCONTINUED] Calcium Carbonate-Vit D-Min (CALCIUM 1200 PO) Take by mouth. (Patient not taking: Reported on 08/06/2020)  . [DISCONTINUED] Cholecalciferol 50 MCG (2000 UT) CAPS Take by mouth. (Patient not taking: Reported on 08/06/2020)  . [DISCONTINUED] meloxicam (MOBIC) 7.5 MG tablet 1-2 po qd prn  . [DISCONTINUED] nystatin cream (MYCOSTATIN) Apply 1 application topically 2 (two) times daily. (Patient not taking: Reported on 08/06/2020)  . [DISCONTINUED]  TURMERIC PO Take by mouth. (Patient not taking: Reported on 08/06/2020)   No facility-administered encounter medications on file as of 08/06/2020.    Allergies (verified) Hydrocodone and Tramadol   History: Past Medical History:  Diagnosis Date  . Arthritis    hands, knees   . Complication of anesthesia   . Family history of adverse reaction to anesthesia    mother of pt. wakes slowly  . Hypertension   . PONV (postoperative nausea and vomiting)    Past Surgical History:  Procedure Laterality Date  . Band Aid surgery  1980's  . CHOLECYSTECTOMY N/A 06/25/2014   Procedure: LAPAROSCOPIC CHOLECYSTECTOMY;  Surgeon: Ralene Ok, MD;  [facility] OR;  Service: General;  Laterality: N/A;  . TUBAL LIGATION     Family History  Problem Relation Age of Onset  . Uterine cancer Mother   . Heart disease Mother 73       chf  . Breast cancer Mother   . Cervical cancer Mother   . Cancer Mother        skin  . COPD Father   . Heart disease Father 64       MI  . Heart disease Brother        cabg with stents  . Cancer Brother        Skin cancer  . Heart disease Brother   . Heart attack Brother 5  . Lung cancer Sister   . Liver cancer Sister   . Breast cancer Sister   . Colon cancer Sister   .  Colon polyps Daughter   . Rectal cancer Neg Hx   . Stomach cancer Neg Hx   . Esophageal cancer Neg Hx    Social History   Socioeconomic History  . Marital status: Married    Spouse name: Not on file  . Number of children: Not on file  . Years of education: Not on file  . Highest education level: Not on file  Occupational History  . Occupation: retired  Tobacco Use  . Smoking status: Never Smoker  . Smokeless tobacco: Never Used  Substance and Sexual Activity  . Alcohol use: No    Alcohol/week: 0.0 standard drinks  . Drug use: No  . Sexual activity: Never    Partners: Male  Other Topics Concern  . Not on file  Social History Narrative   Exercise-- no   Social Determinants of  Health   Financial Resource Strain: Low Risk   . Difficulty of Paying Living Expenses: Not hard at all  Food Insecurity: No Food Insecurity  . Worried About Programme researcher, broadcasting/film/video in the Last Year: Never true  . Ran Out of Food in the Last Year: Never true  Transportation Needs: No Transportation Needs  . Lack of Transportation (Medical): No  . Lack of Transportation (Non-Medical): No  Physical Activity: Inactive  . Days of Exercise per Week: 0 days  . Minutes of Exercise per Session: 0 min  Stress: No Stress Concern Present  . Feeling of Stress : Not at all  Social Connections: Moderately Isolated  . Frequency of Communication with Friends and Family: More than three times a week  . Frequency of Social Gatherings with Friends and Family: Once a week  . Attends Religious Services: Never  . Active Member of Clubs or Organizations: No  . Attends Banker Meetings: Never  . Marital Status: Married    Tobacco Counseling Counseling given: Not Answered   Clinical Intake:  Pre-visit preparation completed: Yes  Pain : 0-10 Pain Type: Chronic pain Pain Location: Knee     Nutritional Status: BMI > 30  Obese  How often do you need to have someone help you when you read instructions, pamphlets, or other written materials from your doctor or pharmacy?: 1 - Never  Diabetic?No  Interpreter Needed?: No  Information entered by :: Thomasenia Sales LPN   Activities of Daily Living In your present state of health, do you have any difficulty performing the following activities: 08/06/2020 08/06/2020  Hearing? N N  Vision? N N  Difficulty concentrating or making decisions? N N  Walking or climbing stairs? N Y  Dressing or bathing? N N  Doing errands, shopping? N N  Preparing Food and eating ? N -  Using the Toilet? N -  In the past six months, have you accidently leaked urine? N -  Do you have problems with loss of bowel control? N -  Managing your Medications? N -   Managing your Finances? N -  Housekeeping or managing your Housekeeping? N -  Some recent data might be hidden    Patient Care Team: Zola Button, Grayling Congress, DO as PCP - General Nelson Chimes, MD as Consulting Physician (Ophthalmology) Lenda Kelp, MD as Consulting Physician (Sports Medicine)  Indicate any recent Medical Services you may have received from other than [facility] providers in the past year (date may be approximate).     Assessment:   This is a routine wellness examination for Mindy Anderson.  Hearing/Vision screen  Hearing Screening  125Hz  250Hz  500Hz  1000Hz  2000Hz  3000Hz  4000Hz  6000Hz  8000Hz   Right ear:           Left ear:           Comments: No issues  Vision Screening Comments: Reading glasses Last eye exam-years ago  Dietary issues and exercise activities discussed: Current Exercise Habits: The patient does not participate in regular exercise at present, Exercise limited by: orthopedic condition(s) (knee pain)  Goals Addressed            This Visit's Progress   . DIET - INCREASE WATER INTAKE   On track   . DIET - REDUCE SODIUM INTAKE   Not on track   . Increase physical activity   Not on track     Depression Screen PHQ 2/9 Scores 08/06/2020 08/06/2020 05/21/2019 05/21/2019 05/17/2018 04/27/2018 04/21/2017  PHQ - 2 Score 0 0 0 0 0 0 0    Fall Risk Fall Risk  08/06/2020 08/06/2020 05/21/2019 05/21/2019 04/27/2018  Falls in the past year? 0 1 0 0 0  Number falls in past yr: 0 0 0 0 -  Injury with Fall? 0 0 0 0 -  Follow up Falls prevention discussed Falls evaluation completed Education provided;Falls prevention discussed Falls evaluation completed -    FALL RISK PREVENTION PERTAINING TO THE HOME:  Any stairs in or around the home? Yes  If so, are there any without handrails? No  Home free of loose throw rugs in walkways, pet beds, electrical cords, etc? Yes  Adequate lighting in your home to reduce risk of falls? Yes   ASSISTIVE DEVICES UTILIZED TO PREVENT  FALLS:  Life alert? No  Use of a cane, walker or w/c? Yes occasionally Grab bars in the bathroom? Yes  Shower chair or bench in shower? No  Elevated toilet seat or a handicapped toilet? No   TIMED UP AND GO:  Was the test performed? Yes .  Length of time to ambulate 10 feet: 11 sec.   Gait slow and steady without use of assistive device  Cognitive Function: MMSE - Mini Mental State Exam 05/17/2018 04/21/2017 04/11/2016  Orientation to time 5 5 5   Orientation to Place 5 5 5   Registration 3 3 3   Attention/ Calculation 4 5 5   Recall 3 2 3   Language- name 2 objects 2 2 2   Language- repeat 1 1 1   Language- follow 3 step command 3 3 3   Language- read & follow direction 1 1 1   Write a sentence 1 1 1   Copy design 1 1 1   Total score 29 29 30         Immunizations Immunization History  Administered Date(s) Administered  . Fluad Quad(high Dose 65+) 12/31/2018, 01/23/2020  . Influenza Inj Mdck Quad Pf 01/13/2018  . Influenza Split 02/09/2011  . Influenza Whole 01/09/2008, 03/11/2009, 03/09/2010, 02/03/2012  . Influenza,inj,Quad PF,6+ Mos 01/15/2014, 01/09/2015  . Influenza-Unspecified 01/29/2016  . PFIZER(Purple Top)SARS-COV-2 Vaccination 04/27/2019, 05/18/2019, 01/29/2020  . Pneumococcal Conjugate-13 10/21/2014  . Pneumococcal Polysaccharide-23 10/17/2013  . Td 02/11/2008  . Zoster 05/18/2009  . Zoster Recombinat (Shingrix) 01/13/2018, 04/20/2018    TDAP status: Due, Education has been provided regarding the importance of this vaccine. Advised may receive this vaccine at local pharmacy or Health Dept. Aware to provide a copy of the vaccination record if obtained from local pharmacy or Health Dept. Verbalized acceptance and understanding.  Flu Vaccine status: Up to date  Pneumococcal vaccine status: Up to date  Covid-19 vaccine status: Completed vaccines  Qualifies for Shingles Vaccine? No   Zostavax completed Yes   Shingrix Completed?: Yes  Screening Tests Health  Maintenance  Topic Date Due  . Hepatitis C Screening  Never done  . TETANUS/TDAP  02/10/2018  . MAMMOGRAM  04/25/2019  . COVID-19 Vaccine (4 - Booster for Pfizer series) 07/29/2020  . INFLUENZA VACCINE  11/02/2020  . COLONOSCOPY (Pts 45-73yrs Insurance coverage will need to be confirmed)  12/27/2023  . DEXA SCAN  Completed  . PNA vac Low Risk Adult  Completed  . HPV VACCINES  Aged Out    Health Maintenance  Health Maintenance Due  Topic Date Due  . Hepatitis C Screening  Never done  . TETANUS/TDAP  02/10/2018  . MAMMOGRAM  04/25/2019  . COVID-19 Vaccine (4 - Booster for Pfizer series) 07/29/2020    Colorectal cancer screening: Type of screening: Colonoscopy. Completed 12/26/2013. Repeat every 10 years  Mammogram status: Due-patient plans to call herself to schedule  Bone Density status: Ordered today. Pt provided with contact info and advised to call to schedule appt.  Lung Cancer Screening: (Low Dose CT Chest recommended if Age 37-80 years, 30 pack-year currently smoking OR have quit w/in 15years.) does not qualify.    Additional Screening:  Hepatitis C Screening: does qualify; Discuss with PCP  Vision Screening: Recommended annual ophthalmology exams for early detection of glaucoma and other disorders of the eye. Is the patient up to date with their annual eye exam?  No  Who is the provider or what is the name of the office in which the patient attends annual eye exams? unknown Patient to make an appt soon  Dental Screening: Recommended annual dental exams for proper oral hygiene  Community Resource Referral / Chronic Care Management: CRR required this visit?  No   CCM required this visit?  No      Plan:     I have personally reviewed and noted the following in the patient's chart:   . Medical and social history . Use of alcohol, tobacco or illicit drugs  . Current medications and supplements including opioid prescriptions.  . Functional ability and  status . Nutritional status . Physical activity . Advanced directives . List of other physicians . Hospitalizations, surgeries, and ER visits in previous 12 months . Vitals . Screenings to include cognitive, depression, and falls . Referrals and appointments  In addition, I have reviewed and discussed with patient certain preventive protocols, quality metrics, and best practice recommendations. A written personalized care plan for preventive services as well as general preventive health recommendations were provided to patient.     Marta Antu, LPN   10/07/1605  Nurse Health Advisor  Nurse Notes: None

## 2020-08-06 ENCOUNTER — Ambulatory Visit (HOSPITAL_BASED_OUTPATIENT_CLINIC_OR_DEPARTMENT_OTHER)
Admission: RE | Admit: 2020-08-06 | Discharge: 2020-08-06 | Disposition: A | Discharge status: Home / Self Care | Payer: Medicare HMO | Source: Ambulatory Visit | Attending: Family Medicine | Admitting: Family Medicine

## 2020-08-06 ENCOUNTER — Encounter: Payer: Self-pay | Admitting: Family Medicine

## 2020-08-06 ENCOUNTER — Ambulatory Visit: Payer: Medicare HMO | Attending: Internal Medicine

## 2020-08-06 ENCOUNTER — Ambulatory Visit (INDEPENDENT_AMBULATORY_CARE_PROVIDER_SITE_OTHER): Payer: Medicare HMO | Admitting: Family Medicine

## 2020-08-06 ENCOUNTER — Ambulatory Visit (INDEPENDENT_AMBULATORY_CARE_PROVIDER_SITE_OTHER): Payer: Medicare HMO

## 2020-08-06 VITALS — BP 128/80 | HR 79 | Temp 98.1°F | Resp 18 | Ht 64.0 in | Wt 203.0 lb

## 2020-08-06 VITALS — BP 128/80 | HR 79 | Temp 98.1°F | Resp 18 | Ht 64.0 in | Wt 203.8 lb

## 2020-08-06 DIAGNOSIS — M545 Low back pain, unspecified: Secondary | ICD-10-CM | POA: Diagnosis not present

## 2020-08-06 DIAGNOSIS — E559 Vitamin D deficiency, unspecified: Secondary | ICD-10-CM | POA: Diagnosis not present

## 2020-08-06 DIAGNOSIS — M25562 Pain in left knee: Secondary | ICD-10-CM

## 2020-08-06 DIAGNOSIS — Z0000 Encounter for general adult medical examination without abnormal findings: Secondary | ICD-10-CM

## 2020-08-06 DIAGNOSIS — M25561 Pain in right knee: Secondary | ICD-10-CM

## 2020-08-06 DIAGNOSIS — I10 Essential (primary) hypertension: Secondary | ICD-10-CM | POA: Diagnosis not present

## 2020-08-06 DIAGNOSIS — M1711 Unilateral primary osteoarthritis, right knee: Secondary | ICD-10-CM | POA: Diagnosis not present

## 2020-08-06 DIAGNOSIS — E2839 Other primary ovarian failure: Secondary | ICD-10-CM | POA: Diagnosis not present

## 2020-08-06 DIAGNOSIS — G8929 Other chronic pain: Secondary | ICD-10-CM | POA: Insufficient documentation

## 2020-08-06 DIAGNOSIS — M25551 Pain in right hip: Secondary | ICD-10-CM

## 2020-08-06 DIAGNOSIS — Z1159 Encounter for screening for other viral diseases: Secondary | ICD-10-CM | POA: Diagnosis not present

## 2020-08-06 DIAGNOSIS — Z23 Encounter for immunization: Secondary | ICD-10-CM

## 2020-08-06 DIAGNOSIS — M1712 Unilateral primary osteoarthritis, left knee: Secondary | ICD-10-CM | POA: Diagnosis not present

## 2020-08-06 LAB — CBC WITH DIFFERENTIAL/PLATELET
Basophils Absolute: 0 10*3/uL (ref 0.0–0.1)
Basophils Relative: 0.6 % (ref 0.0–3.0)
Eosinophils Absolute: 0.2 10*3/uL (ref 0.0–0.7)
Eosinophils Relative: 2.2 % (ref 0.0–5.0)
HCT: 41 % (ref 36.0–46.0)
Hemoglobin: 13.6 g/dL (ref 12.0–15.0)
Lymphocytes Relative: 22.1 % (ref 12.0–46.0)
Lymphs Abs: 1.5 10*3/uL (ref 0.7–4.0)
MCHC: 33.2 g/dL (ref 30.0–36.0)
MCV: 90.7 fl (ref 78.0–100.0)
Monocytes Absolute: 0.6 10*3/uL (ref 0.1–1.0)
Monocytes Relative: 8.8 % (ref 3.0–12.0)
Neutro Abs: 4.6 10*3/uL (ref 1.4–7.7)
Neutrophils Relative %: 66.3 % (ref 43.0–77.0)
Platelets: 204 10*3/uL (ref 150.0–400.0)
RBC: 4.52 Mil/uL (ref 3.87–5.11)
RDW: 13.8 % (ref 11.5–15.5)
WBC: 7 10*3/uL (ref 4.0–10.5)

## 2020-08-06 LAB — LIPID PANEL
Cholesterol: 164 mg/dL (ref 0–200)
HDL: 48.6 mg/dL (ref >39.00)
LDL Cholesterol: 96 mg/dL (ref 0–99)
NonHDL: 115.22
Total CHOL/HDL Ratio: 3
Triglycerides: 95 mg/dL (ref 0.0–149.0)
VLDL: 19 mg/dL (ref 0.0–40.0)

## 2020-08-06 LAB — COMPREHENSIVE METABOLIC PANEL
ALT: 16 U/L (ref 0–35)
AST: 14 U/L (ref 0–37)
Albumin: 4.1 g/dL (ref 3.5–5.2)
Alkaline Phosphatase: 84 U/L (ref 39–117)
BUN: 10 mg/dL (ref 6–23)
CO2: 31 mEq/L (ref 19–32)
Calcium: 9.4 mg/dL (ref 8.4–10.5)
Chloride: 103 mEq/L (ref 96–112)
Creatinine, Ser: 0.72 mg/dL (ref 0.40–1.20)
GFR: 83.87 mL/min (ref >60.00)
Glucose, Bld: 116 mg/dL — ABNORMAL HIGH (ref 70–99)
Potassium: 4.2 mEq/L (ref 3.5–5.1)
Sodium: 142 mEq/L (ref 135–145)
Total Bilirubin: 0.6 mg/dL (ref 0.2–1.2)
Total Protein: 7.4 g/dL (ref 6.0–8.3)

## 2020-08-06 LAB — VITAMIN D 25 HYDROXY (VIT D DEFICIENCY, FRACTURES): VITD: 28.08 ng/mL — ABNORMAL LOW (ref 30.00–100.00)

## 2020-08-06 MED ORDER — MELOXICAM 7.5 MG PO TABS
ORAL_TABLET | ORAL | 1 refills | Status: DC
Start: 2020-08-06 — End: 2020-09-29

## 2020-08-06 NOTE — Assessment & Plan Note (Signed)
Well controlled, no changes to meds. Encouraged heart healthy diet such as the DASH diet and exercise as tolerated.  °

## 2020-08-06 NOTE — Progress Notes (Signed)
Subjective:   By signing my name below, I, Shehryar Baig, attest that this documentation has been prepared under the direction and in the presence of Dr. Roma Schanz, DO. 08/06/2020    Patient ID: Mindy Anderson, female    DOB: December 20, 1950, 70 y.o.   MRN: [id number]  Chief Complaint  Patient presents with  . Annual Exam    Pt states fasting     HPI Patient is in today for a comprehensive physical exam. She is complaining of pain on both of her knees for the past couple of years. Her knees occasionally feel like they buckle while she is walking. She fell down and injured her knee in 2019. She has not seen another provider to manage these issues recently. She is not willing to get surgery to manage her knee issues at this time. She is also complaining of both hands and forearms becoming numb while bending over  last week. The numbness shortly resolved itself. She has no prior history of episode occuring. She has stopped taking her vitamin D recently and has not had any symptoms. She is requesting a refill for her pain medication and 7.5 Meloxicam. She denies having any fever, chills, ear pain, congestion, sinus pain, sore throat, eye pain, chest pain, palpations, leg swelling, cough, shortness of breath, wheezing, nausea, vomiting, diarrhea, constipation, blood in stool, dysuria, frequency, hematuria, dizziness, headaches at this time. She does not participate in exercise at this time. She has not seen her optometrist and is due for an appointment. She is due for a tetanus vaccination. She has received the Covid 19 vaccination and first booster and is willing to receive her second booster at this time. She is also willing to get a hepatitis screening at this time.    Past Medical History:  Diagnosis Date  . Arthritis    hands, knees   . Complication of anesthesia   . Family history of adverse reaction to anesthesia    mother of pt. wakes slowly  . Hypertension   . PONV  (postoperative nausea and vomiting)     Past Surgical History:  Procedure Laterality Date  . Band Aid surgery  1980's  . CHOLECYSTECTOMY N/A 06/25/2014   Procedure: LAPAROSCOPIC CHOLECYSTECTOMY;  Surgeon: Ralene Ok, MD;  Location: [facility] OR;  Service: General;  Laterality: N/A;  . TUBAL LIGATION      Family History  Problem Relation Age of Onset  . Uterine cancer Mother   . Heart disease Mother 75       chf  . Breast cancer Mother   . Cervical cancer Mother   . Cancer Mother        skin  . COPD Father   . Heart disease Father 61       MI  . Heart disease Brother        cabg with stents  . Cancer Brother        Skin cancer  . Heart disease Brother   . Heart attack Brother 32  . Lung cancer Sister   . Liver cancer Sister   . Breast cancer Sister   . Colon cancer Sister   . Colon polyps Daughter   . Rectal cancer Neg Hx   . Stomach cancer Neg Hx   . Esophageal cancer Neg Hx     Social History   Socioeconomic History  . Marital status: Married    Spouse name: Not on file  . Number of children: Not on file  . Years  of education: Not on file  . Highest education level: Not on file  Occupational History  . Occupation: retired  Tobacco Use  . Smoking status: Never Smoker  . Smokeless tobacco: Never Used  Substance and Sexual Activity  . Alcohol use: No    Alcohol/week: 0.0 standard drinks  . Drug use: No  . Sexual activity: Never    Partners: Male  Other Topics Concern  . Not on file  Social History Narrative   Exercise-- no   Social Determinants of Health   Financial Resource Strain: Low Risk   . Difficulty of Paying Living Expenses: Not hard at all  Food Insecurity: No Food Insecurity  . Worried About Programme researcher, broadcasting/film/video in the Last Year: Never true  . Ran Out of Food in the Last Year: Never true  Transportation Needs: No Transportation Needs  . Lack of Transportation (Medical): No  . Lack of Transportation (Non-Medical): No  Physical Activity:  Inactive  . Days of Exercise per Week: 0 days  . Minutes of Exercise per Session: 0 min  Stress: No Stress Concern Present  . Feeling of Stress : Not at all  Social Connections: Moderately Isolated  . Frequency of Communication with Friends and Family: More than three times a week  . Frequency of Social Gatherings with Friends and Family: Once a week  . Attends Religious Services: Never  . Active Member of Clubs or Organizations: No  . Attends Banker Meetings: Never  . Marital Status: Married  Catering manager Violence: Not At Risk  . Fear of Current or Ex-Partner: No  . Emotionally Abused: No  . Physically Abused: No  . Sexually Abused: No    Outpatient Medications Prior to Visit  Medication Sig Dispense Refill  . amLODipine (NORVASC) 5 MG tablet Take 1 tablet (5 mg total) by mouth daily. Pt needs a OV for further refills 90 tablet 3  . aspirin 81 MG tablet Take 81 mg by mouth daily.    . Fluticasone Propionate (FLONASE NA) Place into the nose.    Marland Kitchen lisinopril-hydrochlorothiazide (ZESTORETIC) 10-12.5 MG tablet Take 1 tablet by mouth daily. 90 tablet 3  . meloxicam (MOBIC) 7.5 MG tablet 1-2 po qd prn 60 tablet 1  . Calcium Carbonate-Vit D-Min (CALCIUM 1200 PO) Take by mouth. (Patient not taking: Reported on 08/06/2020)    . Cholecalciferol 50 MCG (2000 UT) CAPS Take by mouth. (Patient not taking: Reported on 08/06/2020)    . nystatin cream (MYCOSTATIN) Apply 1 application topically 2 (two) times daily. (Patient not taking: Reported on 08/06/2020) 30 g 0  . TURMERIC PO Take by mouth. (Patient not taking: Reported on 08/06/2020)     No facility-administered medications prior to visit.    Allergies  Allergen Reactions  . Hydrocodone Itching and Nausea And Vomiting  . Tramadol Nausea Only    Review of Systems  Constitutional: Negative for chills and fever.  HENT: Negative for congestion, ear pain, sinus pain and sore throat.   Eyes: Negative for pain.  Respiratory:  Negative for cough, shortness of breath and wheezing.   Cardiovascular: Negative for chest pain, palpitations and leg swelling.  Gastrointestinal: Negative for blood in stool, constipation, diarrhea, nausea and vomiting.  Genitourinary: Negative for dysuria, frequency and hematuria.  Neurological: Negative for dizziness and headaches.       Objective:    Physical Exam Constitutional:      Appearance: Normal appearance.  HENT:     Head: Normocephalic and atraumatic.  Right Ear: Tympanic membrane and external ear normal.     Left Ear: Tympanic membrane and external ear normal.  Eyes:     Extraocular Movements: Extraocular movements intact.     Pupils: Pupils are equal, round, and reactive to light.  Cardiovascular:     Rate and Rhythm: Normal rate and regular rhythm.     Pulses: Normal pulses.     Heart sounds: Normal heart sounds. No murmur heard.   Pulmonary:     Effort: Pulmonary effort is normal. No respiratory distress.     Breath sounds: Normal breath sounds. No wheezing, rhonchi or rales.  Chest:  Breasts:     Right: Normal.     Left: Normal.    Abdominal:     General: Bowel sounds are normal. There is no distension.     Palpations: Abdomen is soft. There is no mass.     Tenderness: There is no abdominal tenderness. There is no guarding or rebound.  Musculoskeletal:     Comments: 5/5 strength in both upper and lower extremities.  Skin:    General: Skin is warm and dry.  Neurological:     Mental Status: She is alert and oriented to person, place, and time.  Psychiatric:        Behavior: Behavior normal.     BP 128/80 (BP Location: Right Arm, Patient Position: Sitting, Cuff Size: Large)   Pulse 79   Temp 98.1 F (36.7 C) (Oral)   Resp 18   Ht 5\' 4"  (1.626 m)   Wt 203 lb 12.8 oz (92.4 kg)   SpO2 97%   BMI 34.98 kg/m  Wt Readings from Last 3 Encounters:  08/06/20 203 lb (92.1 kg)  08/06/20 203 lb 12.8 oz (92.4 kg)  01/23/20 201 lb 6.4 oz (91.4 kg)     Diabetic Foot Exam - Simple   No data filed    Lab Results  Component Value Date   WBC 7.2 12/31/2018   HGB 13.8 12/31/2018   HCT 41.2 12/31/2018   PLT 201.0 12/31/2018   GLUCOSE 106 (H) 01/23/2020   CHOL 173 01/23/2020   TRIG 86 01/23/2020   HDL 58 01/23/2020   LDLCALC 97 01/23/2020   ALT 16 01/23/2020   AST 20 01/23/2020   NA 140 01/23/2020   K 3.9 01/23/2020   CL 101 01/23/2020   CREATININE 0.79 01/23/2020   BUN 12 01/23/2020   CO2 30 01/23/2020   TSH 1.54 04/02/2012   MICROALBUR 0.6 01/23/2020    Lab Results  Component Value Date   TSH 1.54 04/02/2012   Lab Results  Component Value Date   WBC 7.2 12/31/2018   HGB 13.8 12/31/2018   HCT 41.2 12/31/2018   MCV 91.4 12/31/2018   PLT 201.0 12/31/2018   Lab Results  Component Value Date   NA 140 01/23/2020   K 3.9 01/23/2020   CO2 30 01/23/2020   GLUCOSE 106 (H) 01/23/2020   BUN 12 01/23/2020   CREATININE 0.79 01/23/2020   BILITOT 0.5 01/23/2020   ALKPHOS 80 05/21/2019   AST 20 01/23/2020   ALT 16 01/23/2020   PROT 7.7 01/23/2020   ALBUMIN 3.8 05/21/2019   CALCIUM 9.9 01/23/2020   ANIONGAP 9 06/19/2014   GFR 78.67 05/21/2019   Lab Results  Component Value Date   CHOL 173 01/23/2020   Lab Results  Component Value Date   HDL 58 01/23/2020   Lab Results  Component Value Date   LDLCALC 97 01/23/2020  Lab Results  Component Value Date   TRIG 86 01/23/2020   Lab Results  Component Value Date   CHOLHDL 3.0 01/23/2020   No results found for: HGBA1C  Colonoscopy- Last completed 12/26/2013. Results showed mild diverticulosis in sigmoid colon, otherwise normal findings. Dexa- Last completed 04/13/2016. Results showed osteoporosis, otherwise normal findings. Repeat in 2 years. Pap Smear- Last completed 10/17/2013. Results normal.  Mammogram- Last completed 04/24/2017. Results normal. Repeat in 1 year.    Assessment & Plan:   Problem List Items Addressed This Visit      Unprioritized    Chronic pain of both knees    Use voltaren gel regularly Refer to sport med Xray both knees       Relevant Medications   meloxicam (MOBIC) 7.5 MG tablet   Other Relevant Orders   DG Knee Complete 4 Views Left   DG Knee Complete 4 Views Right   Ambulatory referral to Sports Medicine   Essential hypertension    Well controlled, no changes to meds. Encouraged heart healthy diet such as the DASH diet and exercise as tolerated.       Low back pain with radiation   Relevant Medications   meloxicam (MOBIC) 7.5 MG tablet   Preventative health care - Primary   Right hip pain   Relevant Medications   meloxicam (MOBIC) 7.5 MG tablet    Other Visit Diagnoses    Primary hypertension       Relevant Orders   Lipid panel   CBC with Differential/Platelet   Comprehensive metabolic panel   Vitamin D deficiency       Relevant Orders   Vitamin D (25 hydroxy)   Estrogen deficiency       Relevant Orders   DG Bone Density   Need for hepatitis C screening test       Relevant Orders   Hepatitis C antibody       Meds ordered this encounter  Medications  . meloxicam (MOBIC) 7.5 MG tablet    Sig: 1-2 po qd prn    Dispense:  60 tablet    Refill:  1    I, Ann Held, DO, personally preformed the services described in this documentation.  All medical record entries made by the scribe were at my direction and in my presence.  I have reviewed the chart and discharge instructions (if applicable) and agree that the record reflects my personal performance and is accurate and complete. 08/06/2020   I,Shehryar Baig,acting as a scribe for Ann Held, DO.,have documented all relevant documentation on the behalf of Ann Held, DO,as directed by  Ann Held, DO while in the presence of Ann Held, DO.   Ann Held, DO

## 2020-08-06 NOTE — Patient Instructions (Signed)
Preventive Care 72 Years and Older, Female Preventive care refers to lifestyle choices and visits with your health care provider that can promote health and wellness. This includes:  A yearly physical exam. This is also called an annual wellness visit.  Regular dental and eye exams.  Immunizations.  Screening for certain conditions.  Healthy lifestyle choices, such as: ? Eating a healthy diet. ? Getting regular exercise. ? Not using drugs or products that contain nicotine and tobacco. ? Limiting alcohol use. What can I expect for my preventive care visit? Physical exam Your health care provider will check your:  Height and weight. These may be used to calculate your BMI (body mass index). BMI is a measurement that tells if you are at a healthy weight.  Heart rate and blood pressure.  Body temperature.  Skin for abnormal spots. Counseling Your health care provider may ask you questions about your:  Past medical problems.  Family's medical history.  Alcohol, tobacco, and drug use.  Emotional well-being.  Home life and relationship well-being.  Sexual activity.  Diet, exercise, and sleep habits.  History of falls.  Memory and ability to understand (cognition).  Work and work Statistician.  Pregnancy and menstrual history.  Access to firearms. What immunizations do I need? Vaccines are usually given at various ages, according to a schedule. Your health care provider will recommend vaccines for you based on your age, medical history, and lifestyle or other factors, such as travel or where you work.   What tests do I need? Blood tests  Lipid and cholesterol levels. These may be checked every 5 years, or more often depending on your overall health.  Hepatitis C test.  Hepatitis B test. Screening  Lung cancer screening. You may have this screening every year starting at age 72 if you have a 30-pack-year history of smoking and currently smoke or have quit within  the past 15 years.  Colorectal cancer screening. ? All adults should have this screening starting at age 72 and continuing until age 58. ? Your health care provider may recommend screening at age 2 if you are at increased risk. ? You will have tests every 1-10 years, depending on your results and the type of screening test.  Diabetes screening. ? This is done by checking your blood sugar (glucose) after you have not eaten for a while (fasting). ? You may have this done every 1-3 years.  Mammogram. ? This may be done every 1-2 years. ? Talk with your health care provider about how often you should have regular mammograms.  Abdominal aortic aneurysm (AAA) screening. You may need this if you are a current or former smoker.  BRCA-related cancer screening. This may be done if you have a family history of breast, ovarian, tubal, or peritoneal cancers. Other tests  STD (sexually transmitted disease) testing, if you are at risk.  Bone density scan. This is done to screen for osteoporosis. You may have this done starting at age 72. Talk with your health care provider about your test results, treatment options, and if necessary, the need for more tests. Follow these instructions at home: Eating and drinking  Eat a diet that includes fresh fruits and vegetables, whole grains, lean protein, and low-fat dairy products. Limit your intake of foods with high amounts of sugar, saturated fats, and salt.  Take vitamin and mineral supplements as recommended by your health care provider.  Do not drink alcohol if your health care provider tells you not to drink.  If you drink alcohol: ? Limit how much you have to 0-1 drink a day. ? Be aware of how much alcohol is in your drink. In the [place] one drink equals one 12 oz bottle of beer (355 mL), one 5 oz glass of wine (148 mL), or one 1 oz glass of hard liquor (44 mL).   Lifestyle  Take daily care of your teeth and gums. Brush your teeth every morning  and night with fluoride toothpaste. Floss one time each day.  Stay active. Exercise for at least 30 minutes 5 or more days each week.  Do not use any products that contain nicotine or tobacco, such as cigarettes, e-cigarettes, and chewing tobacco. If you need help quitting, ask your health care provider.  Do not use drugs.  If you are sexually active, practice safe sex. Use a condom or other form of protection in order to prevent STIs (sexually transmitted infections).  Talk with your health care provider about taking a low-dose aspirin or statin.  Find healthy ways to cope with stress, such as: ? Meditation, yoga, or listening to music. ? Journaling. ? Talking to a trusted person. ? Spending time with friends and family. Safety  Always wear your seat belt while driving or riding in a vehicle.  Do not drive: ? If you have been drinking alcohol. Do not ride with someone who has been drinking. ? When you are tired or distracted. ? While texting.  Wear a helmet and other protective equipment during sports activities.  If you have firearms in your house, make sure you follow all gun safety procedures. What's next?  Visit your health care provider once a year for an annual wellness visit.  Ask your health care provider how often you should have your eyes and teeth checked.  Stay up to date on all vaccines. This information is not intended to replace advice given to you by your health care provider. Make sure you discuss any questions you have with your health care provider. Document Revised: 03/11/2020 Document Reviewed: 03/15/2018 Elsevier Patient Education  2021 Elsevier Inc.  

## 2020-08-06 NOTE — Progress Notes (Signed)
   Covid-[facility] Vaccination [facility]  Name:  Mindy Anderson [facility]    MRN: [id number] DOB: April 09, 1950  08/06/2020  [name] was observed post Covid-19 immunization for 15 minutes without incident. She was provided with Vaccine Information Sheet and instruction to access the V-Safe system.   [name] was instructed to call 911 with any severe reactions post vaccine: Marland Kitchen Difficulty breathing  . Swelling of face and throat  . A fast heartbeat  . A bad rash all over body  . Dizziness and weakness   Immunizations Administered    Name Date Dose VIS Date Route   PFIZER Comrnaty(Gray TOP) Covid-19 Vaccine 08/06/2020 10:28 AM 0.3 mL 03/12/2020 Intramuscular   Manufacturer: [employer], [employer]   Lot: [id number]   NDC: [phone]

## 2020-08-06 NOTE — Assessment & Plan Note (Signed)
Use voltaren gel regularly Refer to sport med Xray both knees

## 2020-08-06 NOTE — Patient Instructions (Signed)
Mindy Anderson , Thank you for taking time to come for your Medicare Wellness Visit. I appreciate your ongoing commitment to your health goals. Please review the following plan we discussed and let me know if I can assist you in the future.   Screening recommendations/referrals: Colonoscopy: Completed 12/26/2013-Due 12/27/2023 Mammogram: Due-Per our conversation toady. You will call & schedule. Bone Density: Ordered today by Dr. Etter Sjogren. Recommended yearly ophthalmology/optometry visit for glaucoma screening and checkup Recommended yearly dental visit for hygiene and checkup  Vaccinations: Influenza vaccine: Up to date Pneumococcal vaccine: Completed vaccines Tdap vaccine: Discuss with pharmacy Shingles vaccine: Completed vaccines   Covid-19:Up to date  Advanced directives: Declined information today  Conditions/risks identified: See problem list  Next appointment: Follow up in one year for your annual wellness visit    Preventive Care 72 Years and Older, Female Preventive care refers to lifestyle choices and visits with your health care provider that can promote health and wellness. What does preventive care include?  A yearly physical exam. This is also called an annual well check.  Dental exams once or twice a year.  Routine eye exams. Ask your health care provider how often you should have your eyes checked.  Personal lifestyle choices, including:  Daily care of your teeth and gums.  Regular physical activity.  Eating a healthy diet.  Avoiding tobacco and drug use.  Limiting alcohol use.  Practicing safe sex.  Taking low-dose aspirin every day.  Taking vitamin and mineral supplements as recommended by your health care provider. What happens during an annual well check? The services and screenings done by your health care provider during your annual well check will depend on your age, overall health, lifestyle risk factors, and family history of disease. Counseling   Your health care provider may ask you questions about your:  Alcohol use.  Tobacco use.  Drug use.  Emotional well-being.  Home and relationship well-being.  Sexual activity.  Eating habits.  History of falls.  Memory and ability to understand (cognition).  Work and work Statistician.  Reproductive health. Screening  You may have the following tests or measurements:  Height, weight, and BMI.  Blood pressure.  Lipid and cholesterol levels. These may be checked every 5 years, or more frequently if you are over 60 years old.  Skin check.  Lung cancer screening. You may have this screening every year starting at age 33 if you have a 30-pack-year history of smoking and currently smoke or have quit within the past 15 years.  Fecal occult blood test (FOBT) of the stool. You may have this test every year starting at age 43.  Flexible sigmoidoscopy or colonoscopy. You may have a sigmoidoscopy every 5 years or a colonoscopy every 10 years starting at age 11.  Hepatitis C blood test.  Hepatitis B blood test.  Sexually transmitted disease (STD) testing.  Diabetes screening. This is done by checking your blood sugar (glucose) after you have not eaten for a while (fasting). You may have this done every 1-3 years.  Bone density scan. This is done to screen for osteoporosis. You may have this done starting at age 25.  Mammogram. This may be done every 1-2 years. Talk to your health care provider about how often you should have regular mammograms. Talk with your health care provider about your test results, treatment options, and if necessary, the need for more tests. Vaccines  Your health care provider may recommend certain vaccines, such as:  Influenza vaccine. This is recommended  every year.  Tetanus, diphtheria, and acellular pertussis (Tdap, Td) vaccine. You may need a Td booster every 10 years.  Zoster vaccine. You may need this after age 33.  Pneumococcal 13-valent  conjugate (PCV13) vaccine. One dose is recommended after age 73.  Pneumococcal polysaccharide (PPSV23) vaccine. One dose is recommended after age 70. Talk to your health care provider about which screenings and vaccines you need and how often you need them. This information is not intended to replace advice given to you by your health care provider. Make sure you discuss any questions you have with your health care provider. Document Released: 04/17/2015 Document Revised: 12/09/2015 Document Reviewed: 01/20/2015 Elsevier Interactive Patient Education  2017 [place] Prevention in the Home Falls can cause injuries. They can happen to people of all ages. There are many things you can do to make your home safe and to help prevent falls. What can I do on the outside of my home?  Regularly fix the edges of walkways and driveways and fix any cracks.  Remove anything that might make you trip as you walk through a door, such as a raised step or threshold.  Trim any bushes or trees on the path to your home.  Use bright outdoor lighting.  Clear any walking paths of anything that might make someone trip, such as rocks or tools.  Regularly check to see if handrails are loose or broken. Make sure that both sides of any steps have handrails.  Any raised decks and porches should have guardrails on the edges.  Have any leaves, snow, or ice cleared regularly.  Use sand or salt on walking paths during winter.  Clean up any spills in your garage right away. This includes oil or grease spills. What can I do in the bathroom?  Use night lights.  Install grab bars by the toilet and in the tub and shower. Do not use towel bars as grab bars.  Use non-skid mats or decals in the tub or shower.  If you need to sit down in the shower, use a plastic, non-slip stool.  Keep the floor dry. Clean up any water that spills on the floor as soon as it happens.  Remove soap buildup in the tub or  shower regularly.  Attach bath mats securely with double-sided non-slip rug tape.  Do not have throw rugs and other things on the floor that can make you trip. What can I do in the bedroom?  Use night lights.  Make sure that you have a light by your bed that is easy to reach.  Do not use any sheets or blankets that are too big for your bed. They should not hang down onto the floor.  Have a firm chair that has side arms. You can use this for support while you get dressed.  Do not have throw rugs and other things on the floor that can make you trip. What can I do in the kitchen?  Clean up any spills right away.  Avoid walking on wet floors.  Keep items that you use a lot in easy-to-reach places.  If you need to reach something above you, use a strong step stool that has a grab bar.  Keep electrical cords out of the way.  Do not use floor polish or wax that makes floors slippery. If you must use wax, use non-skid floor wax.  Do not have throw rugs and other things on the floor that can make you trip. What  can I do with my stairs?  Do not leave any items on the stairs.  Make sure that there are handrails on both sides of the stairs and use them. Fix handrails that are broken or loose. Make sure that handrails are as long as the stairways.  Check any carpeting to make sure that it is firmly attached to the stairs. Fix any carpet that is loose or worn.  Avoid having throw rugs at the top or bottom of the stairs. If you do have throw rugs, attach them to the floor with carpet tape.  Make sure that you have a light switch at the top of the stairs and the bottom of the stairs. If you do not have them, ask someone to add them for you. What else can I do to help prevent falls?  Wear shoes that:  Do not have high heels.  Have rubber bottoms.  Are comfortable and fit you well.  Are closed at the toe. Do not wear sandals.  If you use a stepladder:  Make sure that it is fully  opened. Do not climb a closed stepladder.  Make sure that both sides of the stepladder are locked into place.  Ask someone to hold it for you, if possible.  Clearly mark and make sure that you can see:  Any grab bars or handrails.  First and last steps.  Where the edge of each step is.  Use tools that help you move around (mobility aids) if they are needed. These include:  Canes.  Walkers.  Scooters.  Crutches.  Turn on the lights when you go into a dark area. Replace any light bulbs as soon as they burn out.  Set up your furniture so you have a clear path. Avoid moving your furniture around.  If any of your floors are uneven, fix them.  If there are any pets around you, be aware of where they are.  Review your medicines with your doctor. Some medicines can make you feel dizzy. This can increase your chance of falling. Ask your doctor what other things that you can do to help prevent falls. This information is not intended to replace advice given to you by your health care provider. Make sure you discuss any questions you have with your health care provider. Document Released: 01/15/2009 Document Revised: 08/27/2015 Document Reviewed: 04/25/2014 Elsevier Interactive Patient Education  2017 [employer].

## 2020-08-06 NOTE — Assessment & Plan Note (Signed)
ghm utd Check labs See AVS 

## 2020-08-07 ENCOUNTER — Encounter: Payer: Self-pay | Admitting: Family Medicine

## 2020-08-07 LAB — HEPATITIS C ANTIBODY
Hepatitis C Ab: NONREACTIVE
SIGNAL TO CUT-OFF: 0.01 (ref <1.00)

## 2020-08-07 MED ORDER — VITAMIN D (ERGOCALCIFEROL) 1.25 MG (50000 UNIT) PO CAPS: 50000.0000 [IU] | ORAL_CAPSULE | ORAL | 1 refills | Status: DC

## 2020-08-11 ENCOUNTER — Other Ambulatory Visit (HOSPITAL_BASED_OUTPATIENT_CLINIC_OR_DEPARTMENT_OTHER): Payer: Self-pay

## 2020-08-11 MED ORDER — PFIZER-BIONT COVID-19 VAC-TRIS 30 MCG/0.3ML IM SUSP
INTRAMUSCULAR | 0 refills | Status: DC
  Filled 2020-08-11: qty 0.3, 1d supply, fill #0

## 2020-08-20 ENCOUNTER — Telehealth (INDEPENDENT_AMBULATORY_CARE_PROVIDER_SITE_OTHER): Payer: Medicare HMO | Admitting: Family Medicine

## 2020-08-20 ENCOUNTER — Encounter: Payer: Self-pay | Admitting: Family Medicine

## 2020-08-20 ENCOUNTER — Other Ambulatory Visit: Payer: Self-pay

## 2020-08-20 DIAGNOSIS — J40 Bronchitis, not specified as acute or chronic: Secondary | ICD-10-CM | POA: Insufficient documentation

## 2020-08-20 DIAGNOSIS — J029 Acute pharyngitis, unspecified: Secondary | ICD-10-CM | POA: Insufficient documentation

## 2020-08-20 DIAGNOSIS — U071 COVID-19: Secondary | ICD-10-CM | POA: Diagnosis not present

## 2020-08-20 DIAGNOSIS — J209 Acute bronchitis, unspecified: Secondary | ICD-10-CM | POA: Diagnosis not present

## 2020-08-20 DIAGNOSIS — J440 Chronic obstructive pulmonary disease with acute lower respiratory infection: Secondary | ICD-10-CM

## 2020-08-20 MED ORDER — PREDNISONE 10 MG PO TABS: ORAL_TABLET | ORAL | 0 refills | Status: DC

## 2020-08-20 MED ORDER — AZITHROMYCIN 250 MG PO TABS: ORAL_TABLET | ORAL | 0 refills | Status: DC

## 2020-08-20 NOTE — Telephone Encounter (Signed)
You have a 2pm, 240, and 3pm- do you have a preference?

## 2020-08-20 NOTE — Assessment & Plan Note (Signed)
Pt did not want the antiviral  She will stick with pred taper for cough and sore throat and use otc for cough  Call back if symptoms worsen or go to ER quartantine at home for 5 days then 5 more days wearing mask if you leave the house

## 2020-08-20 NOTE — Telephone Encounter (Signed)
Are you willing to do a phone call?

## 2020-08-20 NOTE — Assessment & Plan Note (Signed)
pred taper  + covid Call back if symptoms worsen or go to ER

## 2020-08-20 NOTE — Telephone Encounter (Signed)
Virtual visit and then we can give antiviral and maybe prednisone

## 2020-08-20 NOTE — Progress Notes (Addendum)
Virtual telephone visit    Virtual Visit via Telephone Note   This visit type was conducted due to national recommendations for restrictions regarding the COVID-19 Pandemic (e.g. social distancing) in an effort to limit this patient's exposure and mitigate transmission in our community. Due to her co-morbid illnesses, this patient is at least at moderate risk for complications without adequate follow up. This format is felt to be most appropriate for this patient at this time. The patient did not have access to video technology or had technical difficulties with video requiring transitioning to audio format only (telephone). Physical exam was limited to content and character of the telephone converstion. Alinda Dooms was able to get the patient set up on a telephone visit.   Patient location: home with husband Patient and provider in visit Provider location: Office  I discussed the limitations of evaluation and management by telemedicine and the availability of in person appointments. The patient expressed understanding and agreed to proceed.   Visit Date: 08/20/2020  Today's healthcare provider: Ann Held, DO     Subjective:    Patient ID: Mindy Anderson, female    DOB: [birth year], 70 y.o.   MRN: [id number]  No chief complaint on file.   HPI Patient is in today for sore throat and headache with 101.5 fever since Tuesday.   Her positive test was Wed and Thursday.       Past Medical History:  Diagnosis Date  . Arthritis    hands, knees   . Complication of anesthesia   . Family history of adverse reaction to anesthesia    mother of pt. wakes slowly  . Hypertension   . PONV (postoperative nausea and vomiting)     Past Surgical History:  Procedure Laterality Date  . Band Aid surgery  1980's  . CHOLECYSTECTOMY N/A 06/25/2014   Procedure: LAPAROSCOPIC CHOLECYSTECTOMY;  Surgeon: Ralene Ok, MD;  Location: [facility] OR;  Service: General;  Laterality: N/A;  .  TUBAL LIGATION      Family History  Problem Relation Age of Onset  . Uterine cancer Mother   . Heart disease Mother 27       chf  . Breast cancer Mother   . Cervical cancer Mother   . Cancer Mother        skin  . COPD Father   . Heart disease Father 37       MI  . Heart disease Brother        cabg with stents  . Cancer Brother        Skin cancer  . Heart disease Brother   . Heart attack Brother 10  . Lung cancer Sister   . Liver cancer Sister   . Breast cancer Sister   . Colon cancer Sister   . Colon polyps Daughter   . Rectal cancer Neg Hx   . Stomach cancer Neg Hx   . Esophageal cancer Neg Hx     Social History   Socioeconomic History  . Marital status: Married    Spouse name: Not on file  . Number of children: Not on file  . Years of education: Not on file  . Highest education level: Not on file  Occupational History  . Occupation: retired  Tobacco Use  . Smoking status: Never Smoker  . Smokeless tobacco: Never Used  Substance and Sexual Activity  . Alcohol use: No    Alcohol/week: 0.0 standard drinks  . Drug use: No  .  Sexual activity: Never    Partners: Male  Other Topics Concern  . Not on file  Social History Narrative   Exercise-- no   Social Determinants of Health   Financial Resource Strain: Low Risk   . Difficulty of Paying Living Expenses: Not hard at all  Food Insecurity: No Food Insecurity  . Worried About Charity fundraiser in the Last Year: Never true  . Ran Out of Food in the Last Year: Never true  Transportation Needs: No Transportation Needs  . Lack of Transportation (Medical): No  . Lack of Transportation (Non-Medical): No  Physical Activity: Inactive  . Days of Exercise per Week: 0 days  . Minutes of Exercise per Session: 0 min  Stress: No Stress Concern Present  . Feeling of Stress : Not at all  Social Connections: Moderately Isolated  . Frequency of Communication with Friends and Family: More than three times a week  .  Frequency of Social Gatherings with Friends and Family: Once a week  . Attends Religious Services: Never  . Active Member of Clubs or Organizations: No  . Attends Archivist Meetings: Never  . Marital Status: Married  Human resources officer Violence: Not At Risk  . Fear of Current or Ex-Partner: No  . Emotionally Abused: No  . Physically Abused: No  . Sexually Abused: No    Outpatient Medications Prior to Visit  Medication Sig Dispense Refill  . amLODipine (NORVASC) 5 MG tablet Take 1 tablet (5 mg total) by mouth daily. Pt needs a OV for further refills 90 tablet 3  . aspirin 81 MG tablet Take 81 mg by mouth daily.    Marland Kitchen COVID-19 mRNA Vac-TriS, Pfizer, (PFIZER-BIONT COVID-19 VAC-TRIS) SUSP injection Inject into the muscle. 0.3 mL 0  . Fluticasone Propionate (FLONASE NA) Place into the nose.    Marland Kitchen lisinopril-hydrochlorothiazide (ZESTORETIC) 10-12.5 MG tablet Take 1 tablet by mouth daily. 90 tablet 3  . meloxicam (MOBIC) 7.5 MG tablet 1-2 po qd prn 60 tablet 1  . Vitamin D, Ergocalciferol, (DRISDOL) 1.25 MG (50000 UNIT) CAPS capsule Take 1 capsule (50,000 Units total) by mouth every 7 (seven) days. 12 capsule 1   No facility-administered medications prior to visit.    Allergies  Allergen Reactions  . Hydrocodone Itching and Nausea And Vomiting  . Tramadol Nausea Only    Review of Systems  Constitutional: Positive for fever. Negative for chills and malaise/fatigue.  HENT: Positive for sore throat. Negative for congestion and hearing loss.   Eyes: Negative for discharge.  Respiratory: Positive for cough and wheezing. Negative for sputum production and shortness of breath.   Cardiovascular: Negative for chest pain, palpitations and leg swelling.  Gastrointestinal: Negative for abdominal pain, blood in stool, constipation, diarrhea, heartburn, nausea and vomiting.  Genitourinary: Negative for dysuria, frequency, hematuria and urgency.  Musculoskeletal: Negative for back pain, falls  and myalgias.  Skin: Negative for rash.  Neurological: Negative for dizziness, sensory change, loss of consciousness, weakness and headaches.  Endo/Heme/Allergies: Negative for environmental allergies. Does not bruise/bleed easily.  Psychiatric/Behavioral: Negative for depression and suicidal ideas. The patient is not nervous/anxious and does not have insomnia.        Objective:    Physical Exam Vitals and nursing note reviewed.  Pulmonary:     Effort: Pulmonary effort is normal.     Breath sounds: Wheezing present.  Psychiatric:        Mood and Affect: Mood normal.        Behavior: Behavior normal.  There were no vitals taken for this visit. Wt Readings from Last 3 Encounters:  08/06/20 203 lb (92.1 kg)  08/06/20 203 lb 12.8 oz (92.4 kg)  01/23/20 201 lb 6.4 oz (91.4 kg)    Diabetic Foot Exam - Simple   No data filed    Lab Results  Component Value Date   WBC 7.0 08/06/2020   HGB 13.6 08/06/2020   HCT 41.0 08/06/2020   PLT 204.0 08/06/2020   GLUCOSE 116 (H) 08/06/2020   CHOL 164 08/06/2020   TRIG 95.0 08/06/2020   HDL 48.60 08/06/2020   LDLCALC 96 08/06/2020   ALT 16 08/06/2020   AST 14 08/06/2020   NA 142 08/06/2020   K 4.2 08/06/2020   CL 103 08/06/2020   CREATININE 0.72 08/06/2020   BUN 10 08/06/2020   CO2 31 08/06/2020   TSH 1.54 04/02/2012   MICROALBUR 0.6 01/23/2020    Lab Results  Component Value Date   TSH 1.54 04/02/2012   Lab Results  Component Value Date   WBC 7.0 08/06/2020   HGB 13.6 08/06/2020   HCT 41.0 08/06/2020   MCV 90.7 08/06/2020   PLT 204.0 08/06/2020   Lab Results  Component Value Date   NA 142 08/06/2020   K 4.2 08/06/2020   CO2 31 08/06/2020   GLUCOSE 116 (H) 08/06/2020   BUN 10 08/06/2020   CREATININE 0.72 08/06/2020   BILITOT 0.6 08/06/2020   ALKPHOS 84 08/06/2020   AST 14 08/06/2020   ALT 16 08/06/2020   PROT 7.4 08/06/2020   ALBUMIN 4.1 08/06/2020   CALCIUM 9.4 08/06/2020   ANIONGAP 9 06/19/2014   GFR  83.87 08/06/2020   Lab Results  Component Value Date   CHOL 164 08/06/2020   Lab Results  Component Value Date   HDL 48.60 08/06/2020   Lab Results  Component Value Date   LDLCALC 96 08/06/2020   Lab Results  Component Value Date   TRIG 95.0 08/06/2020   Lab Results  Component Value Date   CHOLHDL 3 08/06/2020   No results found for: HGBA1C     Assessment & Plan:   Problem List Items Addressed This Visit      Unprioritized   Bronchitis    pred taper  + covid Call back if symptoms worsen or go to ER       Relevant Medications   azithromycin (ZITHROMAX Z-PAK) 250 MG tablet   COVID-19 - Primary    Pt did not want the antiviral  She will stick with pred taper for cough and sore throat and use otc for cough  Call back if symptoms worsen or go to ER quartantine at home for 5 days then 5 more days wearing mask if you leave the house       Relevant Medications   predniSONE (DELTASONE) 10 MG tablet   azithromycin (ZITHROMAX Z-PAK) 250 MG tablet   Sore throat   Relevant Medications   predniSONE (DELTASONE) 10 MG tablet    20 min spent reviewing chart and discussing symptoms and tx with pt  I am having [place] start on predniSONE and azithromycin. I am also having her maintain her aspirin, Fluticasone Propionate (FLONASE NA), amLODipine, lisinopril-hydrochlorothiazide, meloxicam, Vitamin D (Ergocalciferol), and Pfizer-BioNT COVID-19 Vac-TriS.  Meds ordered this encounter  Medications  . predniSONE (DELTASONE) 10 MG tablet    Sig: TAKE 3 TABLETS PO QD FOR 3 DAYS THEN TAKE 2 TABLETS PO QD FOR 3 DAYS THEN TAKE 1 TABLET PO  QD FOR 3 DAYS THEN TAKE 1/2 TAB PO QD FOR 3 DAYS    Dispense:  20 tablet    Refill:  0  . azithromycin (ZITHROMAX Z-PAK) 250 MG tablet    Sig: As directed    Dispense:  6 each    Refill:  0     I discussed the assessment and treatment plan with the patient. The patient was provided an opportunity to ask questions and all were answered.  The patient agreed with the plan and demonstrated an understanding of the instructions.   The patient was advised to call back or seek an in-person evaluation if the symptoms worsen or if the condition fails to improve as anticipated.    Ann Held, DO [facility] [phone] (phone) [phone] (fax)  [place]

## 2020-08-20 NOTE — Telephone Encounter (Signed)
If we have to

## 2020-08-20 NOTE — Progress Notes (Signed)
deleted

## 2020-09-29 ENCOUNTER — Other Ambulatory Visit: Payer: Self-pay

## 2020-09-29 DIAGNOSIS — M545 Low back pain, unspecified: Secondary | ICD-10-CM

## 2020-09-29 DIAGNOSIS — M25551 Pain in right hip: Secondary | ICD-10-CM

## 2020-09-29 MED ORDER — MELOXICAM 7.5 MG PO TABS: ORAL_TABLET | ORAL | 0 refills | Status: DC

## 2020-10-14 ENCOUNTER — Other Ambulatory Visit: Payer: Self-pay | Admitting: Family Medicine

## 2020-10-14 DIAGNOSIS — M25551 Pain in right hip: Secondary | ICD-10-CM

## 2020-10-14 DIAGNOSIS — M545 Low back pain, unspecified: Secondary | ICD-10-CM

## 2020-11-06 ENCOUNTER — Telehealth: Payer: Self-pay | Admitting: Family Medicine

## 2020-11-06 ENCOUNTER — Other Ambulatory Visit: Payer: Self-pay | Admitting: Family Medicine

## 2020-11-06 DIAGNOSIS — N760 Acute vaginitis: Secondary | ICD-10-CM

## 2020-11-06 MED ORDER — FLUCONAZOLE 150 MG PO TABS: ORAL_TABLET | ORAL | 0 refills | Status: DC

## 2020-11-06 NOTE — Telephone Encounter (Signed)
Pt states she has a yeast infection from her zpazk. She states that normally after a zpack a few months later she forms a irration in her bottom area. Please advise if meds can be sent in or if an appt is needed. If med can be sent in please send to [facility] ([facility] [place] [address]  [phone], [place] [address]  Phone:  [phone]  Fax:  [phone]

## 2020-11-06 NOTE — Telephone Encounter (Signed)
Pt.notified

## 2021-02-08 ENCOUNTER — Ambulatory Visit: Payer: Medicare HMO | Admitting: Family Medicine

## 2021-02-12 ENCOUNTER — Ambulatory Visit: Payer: Medicare HMO | Admitting: Family Medicine

## 2021-02-16 ENCOUNTER — Ambulatory Visit (INDEPENDENT_AMBULATORY_CARE_PROVIDER_SITE_OTHER): Payer: Medicare HMO | Admitting: Family Medicine

## 2021-02-16 ENCOUNTER — Other Ambulatory Visit: Payer: Self-pay

## 2021-02-16 ENCOUNTER — Encounter: Payer: Self-pay | Admitting: Family Medicine

## 2021-02-16 VITALS — BP 124/72 | HR 83 | Temp 98.3°F | Resp 18 | Ht 64.0 in | Wt 202.2 lb

## 2021-02-16 DIAGNOSIS — E2839 Other primary ovarian failure: Secondary | ICD-10-CM | POA: Diagnosis not present

## 2021-02-16 DIAGNOSIS — E559 Vitamin D deficiency, unspecified: Secondary | ICD-10-CM | POA: Diagnosis not present

## 2021-02-16 DIAGNOSIS — I10 Essential (primary) hypertension: Secondary | ICD-10-CM

## 2021-02-16 DIAGNOSIS — M170 Bilateral primary osteoarthritis of knee: Secondary | ICD-10-CM | POA: Diagnosis not present

## 2021-02-16 LAB — COMPREHENSIVE METABOLIC PANEL
ALT: 14 U/L (ref 0–35)
AST: 14 U/L (ref 0–37)
Albumin: 3.9 g/dL (ref 3.5–5.2)
Alkaline Phosphatase: 90 U/L (ref 39–117)
BUN: 10 mg/dL (ref 6–23)
CO2: 32 mEq/L (ref 19–32)
Calcium: 9.2 mg/dL (ref 8.4–10.5)
Chloride: 101 mEq/L (ref 96–112)
Creatinine, Ser: 0.69 mg/dL (ref 0.40–1.20)
GFR: 86.73 mL/min (ref >60.00)
Glucose, Bld: 120 mg/dL — ABNORMAL HIGH (ref 70–99)
Potassium: 3.9 mEq/L (ref 3.5–5.1)
Sodium: 140 mEq/L (ref 135–145)
Total Bilirubin: 0.6 mg/dL (ref 0.2–1.2)
Total Protein: 7.1 g/dL (ref 6.0–8.3)

## 2021-02-16 LAB — CBC WITH DIFFERENTIAL/PLATELET
Basophils Absolute: 0 10*3/uL (ref 0.0–0.1)
Basophils Relative: 0.7 % (ref 0.0–3.0)
Eosinophils Absolute: 0.3 10*3/uL (ref 0.0–0.7)
Eosinophils Relative: 5.3 % — ABNORMAL HIGH (ref 0.0–5.0)
HCT: 41.4 % (ref 36.0–46.0)
Hemoglobin: 13.8 g/dL (ref 12.0–15.0)
Lymphocytes Relative: 24.1 % (ref 12.0–46.0)
Lymphs Abs: 1.5 10*3/uL (ref 0.7–4.0)
MCHC: 33.3 g/dL (ref 30.0–36.0)
MCV: 90.4 fl (ref 78.0–100.0)
Monocytes Absolute: 0.5 10*3/uL (ref 0.1–1.0)
Monocytes Relative: 8 % (ref 3.0–12.0)
Neutro Abs: 3.7 10*3/uL (ref 1.4–7.7)
Neutrophils Relative %: 61.9 % (ref 43.0–77.0)
Platelets: 198 10*3/uL (ref 150.0–400.0)
RBC: 4.58 Mil/uL (ref 3.87–5.11)
RDW: 13.4 % (ref 11.5–15.5)
WBC: 6 10*3/uL (ref 4.0–10.5)

## 2021-02-16 LAB — LIPID PANEL
Cholesterol: 144 mg/dL (ref 0–200)
HDL: 43.1 mg/dL (ref >39.00)
LDL Cholesterol: 78 mg/dL (ref 0–99)
NonHDL: 100.55
Total CHOL/HDL Ratio: 3
Triglycerides: 113 mg/dL (ref 0.0–149.0)
VLDL: 22.6 mg/dL (ref 0.0–40.0)

## 2021-02-16 LAB — VITAMIN D 25 HYDROXY (VIT D DEFICIENCY, FRACTURES): VITD: 37.14 ng/mL (ref 30.00–100.00)

## 2021-02-16 MED ORDER — AMLODIPINE BESYLATE 5 MG PO TABS: 5.0000 mg | ORAL_TABLET | Freq: Every day | ORAL | 3 refills | Status: DC

## 2021-02-16 MED ORDER — LISINOPRIL-HYDROCHLOROTHIAZIDE 10-12.5 MG PO TABS
1.0000 | ORAL_TABLET | Freq: Every day | ORAL | 3 refills | Status: DC
Start: 2021-02-16 — End: 2021-02-16

## 2021-02-16 MED ORDER — LISINOPRIL-HYDROCHLOROTHIAZIDE 10-12.5 MG PO TABS: 1.0000 | ORAL_TABLET | Freq: Every day | ORAL | 3 refills | Status: DC

## 2021-02-16 MED ORDER — AMLODIPINE BESYLATE 5 MG PO TABS
5.0000 mg | ORAL_TABLET | Freq: Every day | ORAL | 3 refills | Status: DC
Start: 2021-02-16 — End: 2021-02-16

## 2021-02-16 MED ORDER — DICLOFENAC SODIUM 1 % EX GEL: 4.0000 g | Freq: Four times a day (QID) | CUTANEOUS | 2 refills | Status: AC

## 2021-02-16 MED ORDER — DICLOFENAC SODIUM 1 % EX GEL: 4.0000 g | Freq: Four times a day (QID) | CUTANEOUS | 2 refills | Status: DC

## 2021-02-16 NOTE — Assessment & Plan Note (Signed)
Well controlled, no changes to meds. Encouraged heart healthy diet such as the DASH diet and exercise as tolerated.  °

## 2021-02-16 NOTE — Progress Notes (Signed)
Subjective:   By signing my name below, I, Shehryar Baig, attest that this documentation has been prepared under the direction and in the presence of Dr. Roma Schanz, DO. 02/16/2021    Patient ID: Mindy Anderson, female    DOB: 02-18-1951, 70 y.o.   MRN: [id number]  Chief Complaint  Patient presents with   Hypertension   Follow-up    Hypertension Pertinent negatives include no blurred vision, chest pain, headaches, malaise/fatigue, palpitations or shortness of breath.  Patient is in today for a office visit.   Hypertension- Her blood pressure is doing well during this visit. She continues taking 5 mg amlodipine daily PO, 10-12.5 Zestoretic daily PO and reports no new issues while taking them. She is requesting a refill on them as well.   BP Readings from Last 3 Encounters:  02/16/21 124/72  08/06/20 128/80  08/06/20 128/80   Pulse Readings from Last 3 Encounters:  02/16/21 83  08/06/20 79  08/06/20 79   Voltaren- She is requesting a refill on Voltaren gel.  Vitamin D- She reports taking OTC vitamin D regularly. Covid-19- She reports recovering from Covid-19 with no other issues.  Mammogram- She is due for mammogram.  Bone density- She is due for a dexa scan and is planning on scheduling it with her next mammogram.  Immunizations- She has the [employer] vaccines. She was informed of the bivalent Covid-19 vaccines and is not interested in receiving it. She is UTD on shingles vaccines. She is UTD on flu vaccines. She is UTD on tetanus vaccines. She is UTD on pneumonia vaccines.    Past Medical History:  Diagnosis Date   Arthritis    hands, knees    Complication of anesthesia    Family history of adverse reaction to anesthesia    mother of pt. wakes slowly   Hypertension    PONV (postoperative nausea and vomiting)     Past Surgical History:  Procedure Laterality Date   Band Aid surgery  1980's   CHOLECYSTECTOMY N/A 06/25/2014   Procedure: LAPAROSCOPIC  CHOLECYSTECTOMY;  Surgeon: Ralene Ok, MD;  Location: [place];  Service: General;  Laterality: N/A;   TUBAL LIGATION      Family History  Problem Relation Age of Onset   Uterine cancer Mother    Heart disease Mother 16       chf   Breast cancer Mother    Cervical cancer Mother    Cancer Mother        skin   COPD Father    Heart disease Father 8       MI   Heart disease Brother        cabg with stents   Cancer Brother        Skin cancer   Heart disease Brother    Heart attack Brother 45   Lung cancer Sister    Liver cancer Sister    Breast cancer Sister    Colon cancer Sister    Colon polyps Daughter    Rectal cancer Neg Hx    Stomach cancer Neg Hx    Esophageal cancer Neg Hx     Social History   Socioeconomic History   Marital status: Married    Spouse name: Not on file   Number of children: Not on file   Years of education: Not on file   Highest education level: Not on file  Occupational History   Occupation: retired  Tobacco Use   Smoking status: Never  Smokeless tobacco: Never  Substance and Sexual Activity   Alcohol use: No    Alcohol/week: 0.0 standard drinks   Drug use: No   Sexual activity: Never    Partners: Male  Other Topics Concern   Not on file  Social History Narrative   Exercise-- no   Social Determinants of Health   Financial Resource Strain: Low Risk    Difficulty of Paying Living Expenses: Not hard at all  Food Insecurity: No Food Insecurity   Worried About Charity fundraiser in the Last Year: Never true   Ran Out of Food in the Last Year: Never true  Transportation Needs: No Transportation Needs   Lack of Transportation (Medical): No   Lack of Transportation (Non-Medical): No  Physical Activity: Inactive   Days of Exercise per Week: 0 days   Minutes of Exercise per Session: 0 min  Stress: No Stress Concern Present   Feeling of Stress : Not at all  Social Connections: Moderately Isolated   Frequency of Communication with  Friends and Family: More than three times a week   Frequency of Social Gatherings with Friends and Family: Once a week   Attends Religious Services: Never   Marine scientist or Organizations: No   Attends Music therapist: Never   Marital Status: Married  Human resources officer Violence: Not At Risk   Fear of Current or Ex-Partner: No   Emotionally Abused: No   Physically Abused: No   Sexually Abused: No    Outpatient Medications Prior to Visit  Medication Sig Dispense Refill   aspirin 81 MG tablet Take 81 mg by mouth daily.     cholecalciferol (VITAMIN D3) 25 MCG (1000 UNIT) tablet Take 1,000 Units by mouth daily.     Fluticasone Propionate (FLONASE NA) Place into the nose.     meloxicam (MOBIC) 7.5 MG tablet TAKE 1 TO 2 TABLETS EVERY DAY AS NEEDED 60 tablet 0   amLODipine (NORVASC) 5 MG tablet Take 1 tablet (5 mg total) by mouth daily. Pt needs a OV for further refills 90 tablet 3   azithromycin (ZITHROMAX Z-PAK) 250 MG tablet As directed 6 each 0   lisinopril-hydrochlorothiazide (ZESTORETIC) 10-12.5 MG tablet Take 1 tablet by mouth daily. 90 tablet 3   COVID-19 mRNA Vac-TriS, Pfizer, (PFIZER-BIONT COVID-19 VAC-TRIS) SUSP injection Inject into the muscle. (Patient not taking: Reported on 02/16/2021) 0.3 mL 0   fluconazole (DIFLUCAN) 150 MG tablet 1 po x1, may repeat in 3 days prn (Patient not taking: Reported on 02/16/2021) 2 tablet 0   predniSONE (DELTASONE) 10 MG tablet TAKE 3 TABLETS PO QD FOR 3 DAYS THEN TAKE 2 TABLETS PO QD FOR 3 DAYS THEN TAKE 1 TABLET PO QD FOR 3 DAYS THEN TAKE 1/2 TAB PO QD FOR 3 DAYS (Patient not taking: Reported on 02/16/2021) 20 tablet 0   Vitamin D, Ergocalciferol, (DRISDOL) 1.25 MG (50000 UNIT) CAPS capsule Take 1 capsule (50,000 Units total) by mouth every 7 (seven) days. (Patient not taking: Reported on 02/16/2021) 12 capsule 1   No facility-administered medications prior to visit.    Allergies  Allergen Reactions   Hydrocodone Itching  and Nausea And Vomiting   Tramadol Nausea Only    Review of Systems  Constitutional:  Negative for fever and malaise/fatigue.  HENT:  Negative for congestion.   Eyes:  Negative for blurred vision.  Respiratory:  Negative for cough and shortness of breath.   Cardiovascular:  Negative for chest pain, palpitations and leg  swelling.  Gastrointestinal:  Negative for vomiting.  Musculoskeletal:  Negative for back pain.  Skin:  Negative for rash.  Neurological:  Negative for loss of consciousness and headaches.      Objective:    Physical Exam Vitals and nursing note reviewed.  Constitutional:      General: She is not in acute distress.    Appearance: Normal appearance. She is not ill-appearing.  HENT:     Head: Normocephalic and atraumatic.     Right Ear: External ear normal.     Left Ear: External ear normal.  Eyes:     Extraocular Movements: Extraocular movements intact.     Pupils: Pupils are equal, round, and reactive to light.  Cardiovascular:     Rate and Rhythm: Normal rate and regular rhythm.     Heart sounds: Normal heart sounds. No murmur heard.   No gallop.  Pulmonary:     Effort: Pulmonary effort is normal. No respiratory distress.     Breath sounds: Normal breath sounds. No wheezing or rales.  Skin:    General: Skin is warm and dry.  Neurological:     Mental Status: She is alert and oriented to person, place, and time.  Psychiatric:        Behavior: Behavior normal.        Judgment: Judgment normal.    BP 124/72 (BP Location: Left Arm, Patient Position: Standing, Cuff Size: Large)   Pulse 83   Temp 98.3 F (36.8 C) (Oral)   Resp 18   Ht 5\' 4"  (1.626 m)   Wt 202 lb 3.2 oz (91.7 kg)   SpO2 98%   BMI 34.71 kg/m  Wt Readings from Last 3 Encounters:  02/16/21 202 lb 3.2 oz (91.7 kg)  08/06/20 203 lb (92.1 kg)  08/06/20 203 lb 12.8 oz (92.4 kg)    Diabetic Foot Exam - Simple   No data filed    Lab Results  Component Value Date   WBC 7.0 08/06/2020    HGB 13.6 08/06/2020   HCT 41.0 08/06/2020   PLT 204.0 08/06/2020   GLUCOSE 116 (H) 08/06/2020   CHOL 164 08/06/2020   TRIG 95.0 08/06/2020   HDL 48.60 08/06/2020   LDLCALC 96 08/06/2020   ALT 16 08/06/2020   AST 14 08/06/2020   NA 142 08/06/2020   K 4.2 08/06/2020   CL 103 08/06/2020   CREATININE 0.72 08/06/2020   BUN 10 08/06/2020   CO2 31 08/06/2020   TSH 1.54 04/02/2012   MICROALBUR 0.6 01/23/2020    Lab Results  Component Value Date   TSH 1.54 04/02/2012   Lab Results  Component Value Date   WBC 7.0 08/06/2020   HGB 13.6 08/06/2020   HCT 41.0 08/06/2020   MCV 90.7 08/06/2020   PLT 204.0 08/06/2020   Lab Results  Component Value Date   NA 142 08/06/2020   K 4.2 08/06/2020   CO2 31 08/06/2020   GLUCOSE 116 (H) 08/06/2020   BUN 10 08/06/2020   CREATININE 0.72 08/06/2020   BILITOT 0.6 08/06/2020   ALKPHOS 84 08/06/2020   AST 14 08/06/2020   ALT 16 08/06/2020   PROT 7.4 08/06/2020   ALBUMIN 4.1 08/06/2020   CALCIUM 9.4 08/06/2020   ANIONGAP 9 06/19/2014   GFR 83.87 08/06/2020   Lab Results  Component Value Date   CHOL 164 08/06/2020   Lab Results  Component Value Date   HDL 48.60 08/06/2020   Lab Results  Component Value Date  [place] 96 08/06/2020   Lab Results  Component Value Date   TRIG 95.0 08/06/2020   Lab Results  Component Value Date   CHOLHDL 3 08/06/2020   No results found for: HGBA1C     Assessment & Plan:   Problem List Items Addressed This Visit       Unprioritized   Essential hypertension    Well controlled, no changes to meds. Encouraged heart healthy diet such as the DASH diet and exercise as tolerated.       Relevant Medications   lisinopril-hydrochlorothiazide (ZESTORETIC) 10-12.5 MG tablet   amLODipine (NORVASC) 5 MG tablet   Other Relevant Orders   Lipid panel   CBC with Differential/Platelet   Comprehensive metabolic panel   Other Visit Diagnoses     Vitamin D deficiency    -  Primary   Relevant  Orders   VITAMIN D 25 Hydroxy (Vit-D Deficiency, Fractures)   Primary hypertension       Relevant Medications   lisinopril-hydrochlorothiazide (ZESTORETIC) 10-12.5 MG tablet   amLODipine (NORVASC) 5 MG tablet   Other Relevant Orders   Lipid panel   CBC with Differential/Platelet   Comprehensive metabolic panel   Primary osteoarthritis of both knees       Relevant Medications   diclofenac Sodium (VOLTAREN) 1 % GEL   Estrogen deficiency       Relevant Orders   DG Bone Density        Meds ordered this encounter  Medications   DISCONTD: lisinopril-hydrochlorothiazide (ZESTORETIC) 10-12.5 MG tablet    Sig: Take 1 tablet by mouth daily.    Dispense:  90 tablet    Refill:  3   DISCONTD: amLODipine (NORVASC) 5 MG tablet    Sig: Take 1 tablet (5 mg total) by mouth daily. Pt needs a OV for further refills    Dispense:  90 tablet    Refill:  3   DISCONTD: diclofenac Sodium (VOLTAREN) 1 % GEL    Sig: Apply 4 g topically 4 (four) times daily.    Dispense:  150 g    Refill:  2   lisinopril-hydrochlorothiazide (ZESTORETIC) 10-12.5 MG tablet    Sig: Take 1 tablet by mouth daily.    Dispense:  90 tablet    Refill:  3   amLODipine (NORVASC) 5 MG tablet    Sig: Take 1 tablet (5 mg total) by mouth daily. Pt needs a OV for further refills    Dispense:  90 tablet    Refill:  3   diclofenac Sodium (VOLTAREN) 1 % GEL    Sig: Apply 4 g topically 4 (four) times daily.    Dispense:  150 g    Refill:  2    I, Dr. Roma Schanz, DO, personally preformed the services described in this documentation.  All medical record entries made by the scribe were at my direction and in my presence.  I have reviewed the chart and discharge instructions (if applicable) and agree that the record reflects my personal performance and is accurate and complete. 02/16/2021   I,Shehryar Baig,acting as a scribe for Ann Held, DO.,have documented all relevant documentation on the behalf of Ann Held, DO,as directed by  Ann Held, DO while in the presence of Ann Held, DO.   Ann Held, DO

## 2021-02-16 NOTE — Patient Instructions (Signed)

## 2021-02-24 ENCOUNTER — Other Ambulatory Visit: Payer: Self-pay | Admitting: Family Medicine

## 2021-02-24 DIAGNOSIS — Z1231 Encounter for screening mammogram for malignant neoplasm of breast: Secondary | ICD-10-CM

## 2021-07-30 ENCOUNTER — Ambulatory Visit
Admission: RE | Admit: 2021-07-30 | Discharge: 2021-07-30 | Disposition: A | Discharge status: Home / Self Care | Payer: Medicare HMO | Source: Ambulatory Visit | Attending: Family Medicine | Admitting: Family Medicine

## 2021-07-30 DIAGNOSIS — Z780 Asymptomatic menopausal state: Secondary | ICD-10-CM | POA: Diagnosis not present

## 2021-07-30 DIAGNOSIS — Z1231 Encounter for screening mammogram for malignant neoplasm of breast: Secondary | ICD-10-CM | POA: Diagnosis not present

## 2021-07-30 DIAGNOSIS — M85851 Other specified disorders of bone density and structure, right thigh: Secondary | ICD-10-CM | POA: Diagnosis not present

## 2021-07-30 DIAGNOSIS — E2839 Other primary ovarian failure: Secondary | ICD-10-CM

## 2021-08-16 ENCOUNTER — Ambulatory Visit (INDEPENDENT_AMBULATORY_CARE_PROVIDER_SITE_OTHER): Payer: Medicare HMO | Admitting: Family Medicine

## 2021-08-16 ENCOUNTER — Encounter: Payer: Self-pay | Admitting: Family Medicine

## 2021-08-16 ENCOUNTER — Ambulatory Visit (INDEPENDENT_AMBULATORY_CARE_PROVIDER_SITE_OTHER): Payer: Medicare HMO

## 2021-08-16 VITALS — BP 138/74 | HR 74 | Temp 98.3°F | Resp 16 | Ht 64.0 in | Wt 207.8 lb

## 2021-08-16 VITALS — BP 136/74 | HR 76 | Temp 98.3°F | Resp 18 | Ht 64.0 in | Wt 207.8 lb

## 2021-08-16 DIAGNOSIS — Z0001 Encounter for general adult medical examination with abnormal findings: Secondary | ICD-10-CM | POA: Diagnosis not present

## 2021-08-16 DIAGNOSIS — E559 Vitamin D deficiency, unspecified: Secondary | ICD-10-CM | POA: Diagnosis not present

## 2021-08-16 DIAGNOSIS — G8929 Other chronic pain: Secondary | ICD-10-CM

## 2021-08-16 DIAGNOSIS — M79675 Pain in left toe(s): Secondary | ICD-10-CM | POA: Diagnosis not present

## 2021-08-16 DIAGNOSIS — Z0000 Encounter for general adult medical examination without abnormal findings: Secondary | ICD-10-CM

## 2021-08-16 DIAGNOSIS — I10 Essential (primary) hypertension: Secondary | ICD-10-CM

## 2021-08-16 DIAGNOSIS — R079 Chest pain, unspecified: Secondary | ICD-10-CM | POA: Diagnosis not present

## 2021-08-16 DIAGNOSIS — M25571 Pain in right ankle and joints of right foot: Secondary | ICD-10-CM | POA: Diagnosis not present

## 2021-08-16 LAB — CBC WITH DIFFERENTIAL/PLATELET
Basophils Absolute: 0 10*3/uL (ref 0.0–0.1)
Basophils Relative: 0.5 % (ref 0.0–3.0)
Eosinophils Absolute: 0.2 10*3/uL (ref 0.0–0.7)
Eosinophils Relative: 3.8 % (ref 0.0–5.0)
HCT: 39.8 % (ref 36.0–46.0)
Hemoglobin: 13.3 g/dL (ref 12.0–15.0)
Lymphocytes Relative: 25.9 % (ref 12.0–46.0)
Lymphs Abs: 1.6 10*3/uL (ref 0.7–4.0)
MCHC: 33.5 g/dL (ref 30.0–36.0)
MCV: 90.1 fl (ref 78.0–100.0)
Monocytes Absolute: 0.5 10*3/uL (ref 0.1–1.0)
Monocytes Relative: 8.8 % (ref 3.0–12.0)
Neutro Abs: 3.7 10*3/uL (ref 1.4–7.7)
Neutrophils Relative %: 61 % (ref 43.0–77.0)
Platelets: 205 10*3/uL (ref 150.0–400.0)
RBC: 4.42 Mil/uL (ref 3.87–5.11)
RDW: 13.8 % (ref 11.5–15.5)
WBC: 6.1 10*3/uL (ref 4.0–10.5)

## 2021-08-16 LAB — VITAMIN D 25 HYDROXY (VIT D DEFICIENCY, FRACTURES): VITD: 40.1 ng/mL (ref 30.00–100.00)

## 2021-08-16 NOTE — Assessment & Plan Note (Signed)
ghm utd Check labs  

## 2021-08-16 NOTE — Progress Notes (Addendum)
? ?Subjective:  ? ?By signing my name below, I, Mindy Anderson, attest that this documentation has been prepared under the direction and in the presence of Mindy Schanz DO, 08/16/2021   ? ? Patient ID: Mindy Anderson [id number], female    DOB: 1950/09/07, 71 y.o.   MRN: [id number] ? ?Chief Complaint  ?Patient presents with  ? Annual Exam  ?  Pt states fasting   ? ? ?HPI ?Patient is in today for a comprehensive physical exam. ? ?She is requesting a refill of 10-12.5 MG of Lisinopril - Hydrochlorothiazide.  ? ?She has swelling in her lower extremities as well as ankle pain. Her right ankle is worse than her left and pain occurred about a week and half ago. Pain worsens when she's walking. She denies of any injuries.  ? ?She states that she has experienced chest pain that radiates to her back. The first instance occurred in 04/2021 and the second occurred about 2 - 3 weeks ago.   ? ?She denies having any fever, ear pain, new muscle pain, joint pain, new moles, congestion, sinus pain, sore throat, palpations, wheezing, n/v/d, constipation, blood in stool, dysuria, frequency, hematuria at this time. ? ?She denies any changes to her family medical history. She reports no recent surgeries.  ?Colonoscopy was last completed on 12/26/2013 ?Dexa was last completed on 07/30/2021 ?Mammogram was last completed on 07/30/2021 ?She has had 4 Pfizer COVID 19 vaccines. She has not received the Bivalent vaccine. She is due for a tetanus vaccine.  ?She is not regularly exercising. ? ? ? ?Past Medical History:  ?Diagnosis Date  ? Arthritis   ? hands, knees   ? Complication of anesthesia   ? Family history of adverse reaction to anesthesia   ? mother of pt. wakes slowly  ? Hypertension   ? PONV (postoperative nausea and vomiting)   ? ? ?Past Surgical History:  ?Procedure Laterality Date  ? Band Aid surgery  1980's  ? CHOLECYSTECTOMY N/A 06/25/2014  ? Procedure: LAPAROSCOPIC CHOLECYSTECTOMY;  Surgeon: Ralene Ok, MD;  Location: [place];   Service: General;  Laterality: N/A;  ? TUBAL LIGATION    ? ? ?Family History  ?Problem Relation Age of Onset  ? Uterine cancer Mother   ? Heart disease Mother 45  ?     chf  ? Breast cancer Mother   ? Cervical cancer Mother   ? Cancer Mother   ?     skin  ? COPD Father   ? Heart disease Father 62  ?     MI  ? Heart disease Brother   ?     cabg with stents  ? Cancer Brother   ?     Skin cancer  ? Heart disease Brother   ? Heart attack Brother 39  ? Lung cancer Sister   ? Liver cancer Sister   ? Breast cancer Sister   ? Colon cancer Sister   ? Colon polyps Daughter   ? Rectal cancer Neg Hx   ? Stomach cancer Neg Hx   ? Esophageal cancer Neg Hx   ? ? ?Social History  ? ?Socioeconomic History  ? Marital status: Married  ?  Spouse name: Not on file  ? Number of children: Not on file  ? Years of education: Not on file  ? Highest education level: Not on file  ?Occupational History  ? Occupation: retired  ?Tobacco Use  ? Smoking status: Never  ? Smokeless tobacco: Never  ?Substance and Sexual  Activity  ? Alcohol use: No  ?  Alcohol/week: 0.0 standard drinks  ? Drug use: No  ? Sexual activity: Never  ?  Partners: Male  ?Other Topics Concern  ? Not on file  ?Social History Narrative  ? Exercise-- no  ? ?Social Determinants of Health  ? ?Financial Resource Strain: Low Risk   ? Difficulty of Paying Living Expenses: Not hard at all  ?Food Insecurity: No Food Insecurity  ? Worried About Charity fundraiser in the Last Year: Never true  ? Ran Out of Food in the Last Year: Never true  ?Transportation Needs: No Transportation Needs  ? Lack of Transportation (Medical): No  ? Lack of Transportation (Non-Medical): No  ?Physical Activity: Inactive  ? Days of Exercise per Week: 0 days  ? Minutes of Exercise per Session: 0 min  ?Stress: No Stress Concern Present  ? Feeling of Stress : Not at all  ?Social Connections: Moderately Integrated  ? Frequency of Communication with Friends and Family: More than three times a week  ? Frequency of  Social Gatherings with Friends and Family: More than three times a week  ? Attends Religious Services: More than 4 times per year  ? Active Member of Clubs or Organizations: No  ? Attends Archivist Meetings: Never  ? Marital Status: Married  ?Intimate Partner Violence: Not At Risk  ? Fear of Current or Ex-Partner: No  ? Emotionally Abused: No  ? Physically Abused: No  ? Sexually Abused: No  ? ? ?Outpatient Medications Prior to Visit  ?Medication Sig Dispense Refill  ? amLODipine (NORVASC) 5 MG tablet Take 1 tablet (5 mg total) by mouth daily. Pt needs a OV for further refills 90 tablet 3  ? aspirin 81 MG tablet Take 81 mg by mouth daily.    ? cholecalciferol (VITAMIN D3) 25 MCG (1000 UNIT) tablet Take 1,000 Units by mouth daily.    ? diclofenac Sodium (VOLTAREN) 1 % GEL Apply 4 g topically 4 (four) times daily. 150 g 2  ? Fluticasone Propionate (FLONASE NA) Place into the nose.    ? lisinopril-hydrochlorothiazide (ZESTORETIC) 10-12.5 MG tablet Take 1 tablet by mouth daily. 90 tablet 3  ? meloxicam (MOBIC) 7.5 MG tablet TAKE 1 TO 2 TABLETS EVERY DAY AS NEEDED 60 tablet 0  ? ?No facility-administered medications prior to visit.  ? ? ?Allergies  ?Allergen Reactions  ? Hydrocodone Itching and Nausea And Vomiting  ? Tramadol Nausea Only  ? ? ?Review of Systems  ?Constitutional:  Negative for fever.  ?HENT:  Negative for congestion, ear pain, sinus pain and sore throat.   ?Respiratory:  Negative for wheezing.   ?Cardiovascular:  Positive for chest pain (Radiates to the back) and leg swelling. Negative for palpitations.  ?Gastrointestinal:  Negative for blood in stool, constipation, diarrhea, nausea and vomiting.  ?Genitourinary:  Negative for dysuria, frequency and hematuria.  ?Musculoskeletal:  Positive for joint pain (Right Ankle). Negative for myalgias.  ?Skin:   ?     (-) [place]  ? ?   ?Objective:  ?  ?Physical Exam ?Constitutional:   ?   General: She is not in acute distress. ?   Appearance: Normal  appearance. She is not ill-appearing.  ?HENT:  ?   Head: Normocephalic and atraumatic.  ?   Right Ear: Tympanic membrane, ear canal and external ear normal.  ?   Left Ear: Tympanic membrane, ear canal and external ear normal.  ?Eyes:  ?   Extraocular  Movements: Extraocular movements intact.  ?   Pupils: Pupils are equal, round, and reactive to light.  ?Cardiovascular:  ?   Rate and Rhythm: Normal rate and regular rhythm.  ?   Heart sounds: Normal heart sounds. No murmur heard. ?  No gallop.  ?Pulmonary:  ?   Effort: Pulmonary effort is normal. No respiratory distress.  ?   Breath sounds: Normal breath sounds. No wheezing or rales.  ?Abdominal:  ?   General: Bowel sounds are normal. There is no distension.  ?   Palpations: Abdomen is soft.  ?   Tenderness: There is no abdominal tenderness. There is no guarding.  ?Skin: ?   General: Skin is warm and dry.  ?Neurological:  ?   Mental Status: She is alert and oriented to person, place, and time.  ?Psychiatric:     ?   Judgment: Judgment normal.  ? ? ?BP 136/74 (BP Location: Left Arm, Patient Position: Sitting, Cuff Size: Large)   Pulse 76   Temp 98.3 ?F (36.8 ?C) (Oral)   Resp 18   Ht '5\' 4"'$  (1.626 m)   Wt 207 lb 12.8 oz (94.3 kg)   SpO2 97%   BMI 35.67 kg/m?  ?Wt Readings from Last 3 Encounters:  ?08/16/21 207 lb 12.8 oz (94.3 kg)  ?08/16/21 207 lb 12.8 oz (94.3 kg)  ?02/16/21 202 lb 3.2 oz (91.7 kg)  ? ? ?Diabetic Foot Exam - Simple   ?No data filed ?  ? ?Lab Results  ?Component Value Date  ? WBC 6.0 02/16/2021  ? HGB 13.8 02/16/2021  ? HCT 41.4 02/16/2021  ? PLT 198.0 02/16/2021  ? GLUCOSE 120 (H) 02/16/2021  ? CHOL 144 02/16/2021  ? TRIG 113.0 02/16/2021  ? HDL 43.10 02/16/2021  ? [place] 78 02/16/2021  ? ALT 14 02/16/2021  ? AST 14 02/16/2021  ? NA 140 02/16/2021  ? K 3.9 02/16/2021  ? CL 101 02/16/2021  ? CREATININE 0.69 02/16/2021  ? BUN 10 02/16/2021  ? CO2 32 02/16/2021  ? TSH 1.54 04/02/2012  ? MICROALBUR 0.6 01/23/2020  ? ? ?Lab Results  ?Component Value  Date  ? TSH 1.54 04/02/2012  ? ?Lab Results  ?Component Value Date  ? WBC 6.0 02/16/2021  ? HGB 13.8 02/16/2021  ? HCT 41.4 02/16/2021  ? MCV 90.4 02/16/2021  ? PLT 198.0 02/16/2021  ? ?Lab Results  ?Component Value

## 2021-08-16 NOTE — Patient Instructions (Signed)
Ms. Parrella , ?Thank you for taking time to come for your Medicare Wellness Visit. I appreciate your ongoing commitment to your health goals. Please review the following plan we discussed and let me know if I can assist you in the future.  ? ?Screening recommendations/referrals: ?Colonoscopy: 12/26/13 due 12/27/23 ?Mammogram: 07/30/21 due 07/31/22 ?Bone Density: 07/30/21 due 07/31/23 ?Recommended yearly ophthalmology/optometry visit for glaucoma screening and checkup ?Recommended yearly dental visit for hygiene and checkup ? ?Vaccinations: ?Influenza vaccine: up to date ?Pneumococcal vaccine: up to date ?Tdap vaccine: Due-May obtain vaccine at your local pharmacy.  ?Shingles vaccine: up to date   ?Covid-19:Due-May obtain vaccine at your local pharmacy.  ? ?Advanced directives: no ? ?Conditions/risks identified: see problem list ? ?Next appointment: Follow up in one year for your annual wellness visit  ? ? ?Preventive Care 35 Years and Older, Female ?Preventive care refers to lifestyle choices and visits with your health care provider that can promote health and wellness. ?What does preventive care include? ?A yearly physical exam. This is also called an annual well check. ?Dental exams once or twice a year. ?Routine eye exams. Ask your health care provider how often you should have your eyes checked. ?Personal lifestyle choices, including: ?Daily care of your teeth and gums. ?Regular physical activity. ?Eating a healthy diet. ?Avoiding tobacco and drug use. ?Limiting alcohol use. ?Practicing safe sex. ?Taking low-dose aspirin every day. ?Taking vitamin and mineral supplements as recommended by your health care provider. ?What happens during an annual well check? ?The services and screenings done by your health care provider during your annual well check will depend on your age, overall health, lifestyle risk factors, and family history of disease. ?Counseling  ?Your health care provider may ask you questions about  your: ?Alcohol use. ?Tobacco use. ?Drug use. ?Emotional well-being. ?Home and relationship well-being. ?Sexual activity. ?Eating habits. ?History of falls. ?Memory and ability to understand (cognition). ?Work and work Statistician. ?Reproductive health. ?Screening  ?You may have the following tests or measurements: ?Height, weight, and BMI. ?Blood pressure. ?Lipid and cholesterol levels. These may be checked every 5 years, or more frequently if you are over 64 years old. ?Skin check. ?Lung cancer screening. You may have this screening every year starting at age 29 if you have a 30-pack-year history of smoking and currently smoke or have quit within the past 15 years. ?Fecal occult blood test (FOBT) of the stool. You may have this test every year starting at age 75. ?Flexible sigmoidoscopy or colonoscopy. You may have a sigmoidoscopy every 5 years or a colonoscopy every 10 years starting at age 84. ?Hepatitis C blood test. ?Hepatitis B blood test. ?Sexually transmitted disease (STD) testing. ?Diabetes screening. This is done by checking your blood sugar (glucose) after you have not eaten for a while (fasting). You may have this done every 1-3 years. ?Bone density scan. This is done to screen for osteoporosis. You may have this done starting at age 74. ?Mammogram. This may be done every 1-2 years. Talk to your health care provider about how often you should have regular mammograms. ?Talk with your health care provider about your test results, treatment options, and if necessary, the need for more tests. ?Vaccines  ?Your health care provider may recommend certain vaccines, such as: ?Influenza vaccine. This is recommended every year. ?Tetanus, diphtheria, and acellular pertussis (Tdap, Td) vaccine. You may need a Td booster every 10 years. ?Zoster vaccine. You may need this after age 28. ?Pneumococcal 13-valent conjugate (PCV13) vaccine. One dose  is recommended after age 35. ?Pneumococcal polysaccharide (PPSV23) vaccine.  One dose is recommended after age 61. ?Talk to your health care provider about which screenings and vaccines you need and how often you need them. ?This information is not intended to replace advice given to you by your health care provider. Make sure you discuss any questions you have with your health care provider. ?Document Released: 04/17/2015 Document Revised: 12/09/2015 Document Reviewed: 01/20/2015 ?Elsevier Interactive Patient Education ? 2017 [employer]. ? ?Fall Prevention in the Home ?Falls can cause injuries. They can happen to people of all ages. There are many things you can do to make your home safe and to help prevent falls. ?What can I do on the outside of my home? ?Regularly fix the edges of walkways and driveways and fix any cracks. ?Remove anything that might make you trip as you walk through a door, such as a raised step or threshold. ?Trim any bushes or trees on the path to your home. ?Use bright outdoor lighting. ?Clear any walking paths of anything that might make someone trip, such as rocks or tools. ?Regularly check to see if handrails are loose or broken. Make sure that both sides of any steps have handrails. ?Any raised decks and porches should have guardrails on the edges. ?Have any leaves, snow, or ice cleared regularly. ?Use sand or salt on walking paths during winter. ?Clean up any spills in your garage right away. This includes oil or grease spills. ?What can I do in the bathroom? ?Use night lights. ?Install grab bars by the toilet and in the tub and shower. Do not use towel bars as grab bars. ?Use non-skid mats or decals in the tub or shower. ?If you need to sit down in the shower, use a plastic, non-slip stool. ?Keep the floor dry. Clean up any water that spills on the floor as soon as it happens. ?Remove soap buildup in the tub or shower regularly. ?Attach bath mats securely with double-sided non-slip rug tape. ?Do not have throw rugs and other things on the floor that can make  you trip. ?What can I do in the bedroom? ?Use night lights. ?Make sure that you have a light by your bed that is easy to reach. ?Do not use any sheets or blankets that are too big for your bed. They should not hang down onto the floor. ?Have a firm chair that has side arms. You can use this for support while you get dressed. ?Do not have throw rugs and other things on the floor that can make you trip. ?What can I do in the kitchen? ?Clean up any spills right away. ?Avoid walking on wet floors. ?Keep items that you use a lot in easy-to-reach places. ?If you need to reach something above you, use a strong step stool that has a grab bar. ?Keep electrical cords out of the way. ?Do not use floor polish or wax that makes floors slippery. If you must use wax, use non-skid floor wax. ?Do not have throw rugs and other things on the floor that can make you trip. ?What can I do with my stairs? ?Do not leave any items on the stairs. ?Make sure that there are handrails on both sides of the stairs and use them. Fix handrails that are broken or loose. Make sure that handrails are as long as the stairways. ?Check any carpeting to make sure that it is firmly attached to the stairs. Fix any carpet that is loose or worn. ?Avoid  having throw rugs at the top or bottom of the stairs. If you do have throw rugs, attach them to the floor with carpet tape. ?Make sure that you have a light switch at the top of the stairs and the bottom of the stairs. If you do not have them, ask someone to add them for you. ?What else can I do to help prevent falls? ?Wear shoes that: ?Do not have high heels. ?Have rubber bottoms. ?Are comfortable and fit you well. ?Are closed at the toe. Do not wear sandals. ?If you use a stepladder: ?Make sure that it is fully opened. Do not climb a closed stepladder. ?Make sure that both sides of the stepladder are locked into place. ?Ask someone to hold it for you, if possible. ?Clearly mark and make sure that you can  see: ?Any grab bars or handrails. ?First and last steps. ?Where the edge of each step is. ?Use tools that help you move around (mobility aids) if they are needed. These include: ?Canes. ?Walkers. ?Scooters. ?Cru

## 2021-08-16 NOTE — Assessment & Plan Note (Signed)
ekg-- NSR ?No acute changes  ?

## 2021-08-16 NOTE — Progress Notes (Signed)
Subjective:   Mindy Anderson is a 71 y.o. female who presents for Medicare Annual (Subsequent) preventive examination.  Review of Systems     Cardiac Risk Factors include: advanced age (>59mn, >>7women);hypertension     Objective:    Today's Vitals   08/16/21 1011 08/16/21 1017  BP: 138/74   Pulse: 74   Resp: 16   Temp: 98.3 F (36.8 C)   SpO2: 98%   Weight: 207 lb 12.8 oz (94.3 kg)   Height: '5\' 4"'$  (1.626 m)   PainSc:  5    Body mass index is 35.67 kg/m.     08/16/2021   10:15 AM 08/06/2020    9:31 AM 05/21/2019    3:22 PM 05/17/2018   10:50 AM 04/21/2017    1:16 PM 04/11/2016    2:14 PM 06/19/2014    8:46 AM  Advanced Directives  Does Patient Have a Medical Advance Directive? No No No No Yes No No  Does patient want to make changes to medical advance directive?     No - Patient declined Yes (MAU/Ambulatory/Procedural Areas - Information given)   Would patient like information on creating a medical advance directive? No - Patient declined No - Patient declined No - Patient declined No - Patient declined   Yes - Educational materials given    Current Medications (verified) Outpatient Encounter Medications as of 08/16/2021  Medication Sig   amLODipine (NORVASC) 5 MG tablet Take 1 tablet (5 mg total) by mouth daily. Pt needs a OV for further refills   aspirin 81 MG tablet Take 81 mg by mouth daily.   cholecalciferol (VITAMIN D3) 25 MCG (1000 UNIT) tablet Take 1,000 Units by mouth daily.   diclofenac Sodium (VOLTAREN) 1 % GEL Apply 4 g topically 4 (four) times daily.   Fluticasone Propionate (FLONASE NA) Place into the nose.   lisinopril-hydrochlorothiazide (ZESTORETIC) 10-12.5 MG tablet Take 1 tablet by mouth daily.   meloxicam (MOBIC) 7.5 MG tablet TAKE 1 TO 2 TABLETS EVERY DAY AS NEEDED   No facility-administered encounter medications on file as of 08/16/2021.    Allergies (verified) Hydrocodone and Tramadol   History: Past Medical History:  Diagnosis Date    Arthritis    hands, knees    Complication of anesthesia    Family history of adverse reaction to anesthesia    mother of pt. wakes slowly   Hypertension    PONV (postoperative nausea and vomiting)    Past Surgical History:  Procedure Laterality Date   Band Aid surgery  1980's   CHOLECYSTECTOMY N/A 06/25/2014   Procedure: LAPAROSCOPIC CHOLECYSTECTOMY;  Surgeon: ARalene Ok MD;  [place] [facility]  Service: General;  Laterality: N/A;   TUBAL LIGATION     Family History  Problem Relation Age of Onset   Uterine cancer Mother    Heart disease Mother 98      chf   Breast cancer Mother    Cervical cancer Mother    Cancer Mother        skin   COPD Father    Heart disease Father 553      MI   Heart disease Brother        cabg with stents   Cancer Brother        Skin cancer   Heart disease Brother    Heart attack Brother 663  Lung cancer Sister    Liver cancer Sister    Breast cancer Sister    Colon  cancer Sister    Colon polyps Daughter    Rectal cancer Neg Hx    Stomach cancer Neg Hx    Esophageal cancer Neg Hx    Social History   Socioeconomic History   Marital status: Married    Spouse name: Not on file   Number of children: Not on file   Years of education: Not on file   Highest education level: Not on file  Occupational History   Occupation: retired  Tobacco Use   Smoking status: Never   Smokeless tobacco: Never  Substance and Sexual Activity   Alcohol use: No    Alcohol/week: 0.0 standard drinks   Drug use: No   Sexual activity: Never    Partners: Male  Other Topics Concern   Not on file  Social History Narrative   Exercise-- no   Social Determinants of Health   Financial Resource Strain: Low Risk    Difficulty of Paying Living Expenses: Not hard at all  Food Insecurity: No Food Insecurity   Worried About Charity fundraiser in the Last Year: Never true   [place] in the Last Year: Never true  Transportation Needs: No Transportation  Needs   Lack of Transportation (Medical): No   Lack of Transportation (Non-Medical): No  Physical Activity: Inactive   Days of Exercise per Week: 0 days   Minutes of Exercise per Session: 0 min  Stress: No Stress Concern Present   Feeling of Stress : Not at all  Social Connections: Moderately Integrated   Frequency of Communication with Friends and Family: More than three times a week   Frequency of Social Gatherings with Friends and Family: More than three times a week   Attends Religious Services: More than 4 times per year   Active Member of [employer] or Organizations: No   Attends Archivist Meetings: Never   Marital Status: Married    Tobacco Counseling Counseling given: Not Answered   Clinical Intake:  Pre-visit preparation completed: Yes  Pain : 0-10 Pain Score: 5  Pain Type: Chronic pain Pain Descriptors / Indicators: Aching, Dull, Sore Pain Onset: In the past 7 days Pain Frequency: Several days a week     Nutritional Risks: None Diabetes: No  How often do you need to have someone help you when you read instructions, pamphlets, or other written materials from your doctor or pharmacy?: 1 - Never  Diabetic?No  Interpreter Needed?: No  Information entered by :: [employer] of Daily Living    08/16/2021   10:19 AM  In your present state of health, do you have any difficulty performing the following activities:  Hearing? 0  Vision? 0  Difficulty concentrating or making decisions? 0  Walking or climbing stairs? 1  Dressing or bathing? 0  Doing errands, shopping? 0  Preparing Food and eating ? N  Using the Toilet? N  In the past six months, have you accidently leaked urine? Y  Do you have problems with loss of bowel control? Y  Managing your Medications? N  Managing your Finances? N  Housekeeping or managing your Housekeeping? N    Patient Care Team: Mindy Anderson, Mindy Apa, DO as PCP - General Mindy Cantor, MD as Consulting  Physician (Ophthalmology) Mindy Gentry, MD as Consulting Physician (Sports Medicine)  Indicate any recent Medical Services you may have received from other than [facility] providers in the past year (date may be approximate).     Assessment:  This is a routine wellness examination for Mindy Anderson.  Hearing/Vision screen No results found.  Dietary issues and exercise activities discussed: Current Exercise Habits: The patient does not participate in regular exercise at present, Exercise limited by: None identified   Goals Addressed             This Visit's Progress    DIET - INCREASE WATER INTAKE   On track    DIET - REDUCE SODIUM INTAKE   Not on track    Increase physical activity   Not on track      Depression Screen    08/16/2021   10:16 AM 08/16/2021    9:16 AM 08/06/2020    9:34 AM 08/06/2020    8:59 AM 05/21/2019    3:24 PM 05/21/2019    9:59 AM 05/17/2018   10:44 AM  PHQ 2/9 Scores  PHQ - 2 Score 0 0 0 0 0 0 0    Fall Risk    08/16/2021   10:15 AM 08/16/2021    9:15 AM 08/06/2020    9:33 AM 08/06/2020    8:56 AM 05/21/2019    3:24 PM  [place] in the past year? 1 0 0 1 0  Number falls in past yr: 0 0 0 0 0  Injury with Fall? 0 0 0 0 0  Risk for fall due to : No Fall Risks Impaired balance/gait;Impaired mobility     Follow up Falls evaluation completed Falls evaluation completed Falls prevention discussed Falls evaluation completed Education provided;Falls prevention discussed    FALL RISK PREVENTION PERTAINING TO THE HOME:  Any stairs in or around the home? Yes  If so, are there any without handrails? No  Home free of loose throw rugs in walkways, pet beds, electrical cords, etc? Yes  Adequate lighting in your home to reduce risk of falls? Yes   ASSISTIVE DEVICES UTILIZED TO PREVENT FALLS:  Life alert? No  Use of a cane, walker or w/c? Yes  Grab bars in the bathroom? Yes  Shower chair or bench in shower? Yes  Elevated toilet seat or a handicapped toilet?  Yes   TIMED UP AND GO:  Was the test performed? Yes .  Length of time to ambulate 10 feet: 15 sec.   Gait steady and fast with assistive device  Cognitive Function:    05/17/2018   10:50 AM 04/21/2017    1:19 PM 04/11/2016    2:18 PM  MMSE - Mini Mental State Exam  Orientation to time '5 5 5  '$ Orientation to Place '5 5 5  '$ Registration '3 3 3  '$ Attention/ Calculation '4 5 5  '$ Recall '3 2 3  '$ Language- name 2 objects '2 2 2  '$ Language- repeat '1 1 1  '$ Language- follow 3 step command '3 3 3  '$ Language- read & follow direction '1 1 1  '$ Write a sentence '1 1 1  '$ Copy design '1 1 1  '$ Total score '29 29 30        '$ 08/16/2021   10:22 AM  6CIT Screen  What Year? 0 points  What month? 0 points  What time? 0 points  Count back from 20 0 points  Months in reverse 0 points  Repeat phrase 0 points  Total Score 0 points    Immunizations Immunization History  Administered Date(s) Administered   Fluad Quad(high Dose 65+) 12/31/2018, 01/23/2020, 01/26/2021   Influenza Inj Mdck Quad Pf 01/13/2018   Influenza Split 02/09/2011   Influenza Whole  01/09/2008, 03/11/2009, 03/09/2010, 02/03/2012   Influenza,inj,Quad PF,6+ Mos 01/15/2014, 01/09/2015   Influenza-Unspecified 01/29/2016   PFIZER Comirnaty(Gray Top)Covid-19 Tri-Sucrose Vaccine 08/06/2020   PFIZER(Purple Top)SARS-COV-2 Vaccination 04/27/2019, 05/18/2019, 01/29/2020   Pneumococcal Conjugate-13 10/21/2014   Pneumococcal Polysaccharide-23 10/17/2013   Td 02/11/2008   Zoster Recombinat (Shingrix) 01/13/2018, 04/20/2018   Zoster, Live 05/18/2009    TDAP status: Due, Education has been provided regarding the importance of this vaccine. Advised may receive this vaccine at local pharmacy or Health Dept. Aware to provide a copy of the vaccination record if obtained from local pharmacy or Health Dept. Verbalized acceptance and understanding.  Flu Vaccine status: Up to date  Pneumococcal vaccine status: Up to date  Covid-19 vaccine status: Information  provided on how to obtain vaccines.   Qualifies for Shingles Vaccine? Yes   Zostavax completed No   Shingrix Completed?: Yes  Screening Tests Health Maintenance  Topic Date Due   TETANUS/TDAP  02/10/2018   COVID-19 Vaccine (5 - Booster for Pfizer series) 09/01/2021 (Originally 10/01/2020)   INFLUENZA VACCINE  11/02/2021   MAMMOGRAM  07/31/2023   COLONOSCOPY (Pts 45-77yr Insurance coverage will need to be confirmed)  12/27/2023   Pneumonia Vaccine 73 Years old  Completed   DEXA SCAN  Completed   Hepatitis C Screening  Completed   Zoster Vaccines- Shingrix  Completed   HPV VACCINES  Aged Out    Health Maintenance  Health Maintenance Due  Topic Date Due   TETANUS/TDAP  02/10/2018    Colorectal cancer screening: Type of screening: Colonoscopy. Completed 12/26/13. Repeat every 10 years  Mammogram status: Completed 07/30/21. Repeat every year  Bone Density status: Completed 07/30/21. Results reflect: Bone density results: OSTEOPENIA. Repeat every 2 years.  Lung Cancer Screening: (Low Dose CT Chest recommended if Age 73-80years, 30 pack-year currently smoking OR have quit w/in 15years.) does not qualify.   Lung Cancer Screening Referral: N/A  Additional Screening:  Hepatitis C Screening: does qualify; Completed 08/06/20  Vision Screening: Recommended annual ophthalmology exams for early detection of glaucoma and other disorders of the eye. Is the patient up to date with their annual eye exam?  No  Who is the provider or what is the name of the office in which the patient attends annual eye exams? N/A If pt is not established with a provider, would they like to be referred to a provider to establish care? No .   Dental Screening: Recommended annual dental exams for proper oral hygiene  Community Resource Referral / Chronic Care Management: CRR required this visit?  No   CCM required this visit?  No      Plan:     I have personally reviewed and noted the following in  the patient's chart:   Medical and social history Use of alcohol, tobacco or illicit drugs  Current medications and supplements including opioid prescriptions.  Functional ability and status Nutritional status Physical activity Advanced directives List of other physicians Hospitalizations, surgeries, and ER visits in previous 12 months Vitals Screenings to include cognitive, depression, and falls Referrals and appointments  In addition, I have reviewed and discussed with patient certain preventive protocols, quality metrics, and best practice recommendations. A written personalized care plan for preventive services as well as general preventive health recommendations were provided to patient.     SDuard BradyChism, CLeake  08/16/2021   Nurse Notes: none

## 2021-08-16 NOTE — Patient Instructions (Signed)
Preventive Care 7 Years and Older, Female ?Preventive care refers to lifestyle choices and visits with your health care provider that can promote health and wellness. Preventive care visits are also called wellness exams. ?What can I expect for my preventive care visit? ?Counseling ?Your health care provider may ask you questions about your: ?Medical history, including: ?Past medical problems. ?Family medical history. ?Pregnancy and menstrual history. ?History of falls. ?Current health, including: ?Memory and ability to understand (cognition). ?Emotional well-being. ?Home life and relationship well-being. ?Sexual activity and sexual health. ?Lifestyle, including: ?Alcohol, nicotine or tobacco, and drug use. ?Access to firearms. ?Diet, exercise, and sleep habits. ?Work and work Statistician. ?Sunscreen use. ?Safety issues such as seatbelt and bike helmet use. ?Physical exam ?Your health care provider will check your: ?Height and weight. These may be used to calculate your BMI (body mass index). BMI is a measurement that tells if you are at a healthy weight. ?Waist circumference. This measures the distance around your waistline. This measurement also tells if you are at a healthy weight and may help predict your risk of certain diseases, such as type 2 diabetes and high blood pressure. ?Heart rate and blood pressure. ?Body temperature. ?Skin for abnormal spots. ?What immunizations do I need? ? ?Vaccines are usually given at various ages, according to a schedule. Your health care provider will recommend vaccines for you based on your age, medical history, and lifestyle or other factors, such as travel or where you work. ?What tests do I need? ?Screening ?Your health care provider may recommend screening tests for certain conditions. This may include: ?Lipid and cholesterol levels. ?Hepatitis C test. ?Hepatitis B test. ?HIV (human immunodeficiency virus) test. ?STI (sexually transmitted infection) testing, if you are at  risk. ?Lung cancer screening. ?Colorectal cancer screening. ?Diabetes screening. This is done by checking your blood sugar (glucose) after you have not eaten for a while (fasting). ?Mammogram. Talk with your health care provider about how often you should have regular mammograms. ?BRCA-related cancer screening. This may be done if you have a family history of breast, ovarian, tubal, or peritoneal cancers. ?Bone density scan. This is done to screen for osteoporosis. ?Talk with your health care provider about your test results, treatment options, and if necessary, the need for more tests. ?Follow these instructions at home: ?Eating and drinking ? ?Eat a diet that includes fresh fruits and vegetables, whole grains, lean protein, and low-fat dairy products. Limit your intake of foods with high amounts of sugar, saturated fats, and salt. ?Take vitamin and mineral supplements as recommended by your health care provider. ?Do not drink alcohol if your health care provider tells you not to drink. ?If you drink alcohol: ?Limit how much you have to 0-1 drink a day. ?Know how much alcohol is in your drink. In the [place] one drink equals one 12 oz bottle of beer (355 mL), one 5 oz glass of wine (148 mL), or one 1? oz glass of hard liquor (44 mL). ?Lifestyle ?Brush your teeth every morning and night with fluoride toothpaste. Floss one time each day. ?Exercise for at least 30 minutes 5 or more days each week. ?Do not use any products that contain nicotine or tobacco. These products include cigarettes, chewing tobacco, and vaping devices, such as e-cigarettes. If you need help quitting, ask your health care provider. ?Do not use drugs. ?If you are sexually active, practice safe sex. Use a condom or other form of protection in order to prevent STIs. ?Take aspirin only as told by  your health care provider. Make sure that you understand how much to take and what form to take. Work with your health care provider to find out whether it  is safe and beneficial for you to take aspirin daily. ?Ask your health care provider if you need to take a cholesterol-lowering medicine (statin). ?Find healthy ways to manage stress, such as: ?Meditation, yoga, or listening to music. ?Journaling. ?Talking to a trusted person. ?Spending time with friends and family. ?Minimize exposure to UV radiation to reduce your risk of skin cancer. ?Safety ?Always wear your seat belt while driving or riding in a vehicle. ?Do not drive: ?If you have been drinking alcohol. Do not ride with someone who has been drinking. ?When you are tired or distracted. ?While texting. ?If you have been using any mind-altering substances or drugs. ?Wear a helmet and other protective equipment during sports activities. ?If you have firearms in your house, make sure you follow all gun safety procedures. ?What's next? ?Visit your health care provider once a year for an annual wellness visit. ?Ask your health care provider how often you should have your eyes and teeth checked. ?Stay up to date on all vaccines. ?This information is not intended to replace advice given to you by your health care provider. Make sure you discuss any questions you have with your health care provider. ?Document Revised: 09/16/2020 Document Reviewed: 09/16/2020 ?Elsevier Patient Education ? [place]. ? ?

## 2021-08-16 NOTE — Assessment & Plan Note (Signed)
Well controlled, no changes to meds. Encouraged heart healthy diet such as the DASH diet and exercise as tolerated.  °

## 2021-08-16 NOTE — Assessment & Plan Note (Signed)
Check xray ?No known injury ?

## 2021-08-17 LAB — LIPID PANEL
Cholesterol: 157 mg/dL (ref 0–200)
HDL: 44.1 mg/dL (ref >39.00)
LDL Cholesterol: 85 mg/dL (ref 0–99)
NonHDL: 113.09
Total CHOL/HDL Ratio: 4
Triglycerides: 138 mg/dL (ref 0.0–149.0)
VLDL: 27.6 mg/dL (ref 0.0–40.0)

## 2021-08-17 LAB — COMPREHENSIVE METABOLIC PANEL
ALT: 12 U/L (ref 0–35)
AST: 13 U/L (ref 0–37)
Albumin: 3.9 g/dL (ref 3.5–5.2)
Alkaline Phosphatase: 81 U/L (ref 39–117)
BUN: 13 mg/dL (ref 6–23)
CO2: 26 mEq/L (ref 19–32)
Calcium: 9.2 mg/dL (ref 8.4–10.5)
Chloride: 104 mEq/L (ref 96–112)
Creatinine, Ser: 0.81 mg/dL (ref 0.40–1.20)
GFR: 72.29 mL/min (ref >60.00)
Glucose, Bld: 113 mg/dL — ABNORMAL HIGH (ref 70–99)
Potassium: 4.4 mEq/L (ref 3.5–5.1)
Sodium: 142 mEq/L (ref 135–145)
Total Bilirubin: 0.4 mg/dL (ref 0.2–1.2)
Total Protein: 7 g/dL (ref 6.0–8.3)

## 2021-08-17 LAB — URIC ACID: Uric Acid, Serum: 6.8 mg/dL (ref 2.4–7.0)

## 2021-09-16 ENCOUNTER — Ambulatory Visit (INDEPENDENT_AMBULATORY_CARE_PROVIDER_SITE_OTHER): Payer: Medicare HMO | Admitting: Family Medicine

## 2021-09-16 ENCOUNTER — Other Ambulatory Visit (HOSPITAL_BASED_OUTPATIENT_CLINIC_OR_DEPARTMENT_OTHER): Payer: Self-pay

## 2021-09-16 ENCOUNTER — Encounter: Payer: Self-pay | Admitting: Family Medicine

## 2021-09-16 VITALS — BP 124/58 | HR 89 | Temp 100.3°F | Ht 64.0 in | Wt 203.6 lb

## 2021-09-16 DIAGNOSIS — R519 Headache, unspecified: Secondary | ICD-10-CM

## 2021-09-16 DIAGNOSIS — R051 Acute cough: Secondary | ICD-10-CM | POA: Diagnosis not present

## 2021-09-16 DIAGNOSIS — J029 Acute pharyngitis, unspecified: Secondary | ICD-10-CM

## 2021-09-16 LAB — POC COVID19 BINAXNOW: SARS Coronavirus 2 Ag: NEGATIVE

## 2021-09-16 LAB — POCT INFLUENZA A/B
Influenza A, POC: NEGATIVE
Influenza B, POC: NEGATIVE

## 2021-09-16 LAB — POCT RAPID STREP A (OFFICE): Rapid Strep A Screen: NEGATIVE

## 2021-09-16 MED ORDER — AMOXICILLIN-POT CLAVULANATE 875-125 MG PO TABS
1.0000 | ORAL_TABLET | Freq: Two times a day (BID) | ORAL | 0 refills | Status: DC
  Filled 2021-09-16: qty 20, 10d supply, fill #0

## 2021-09-16 NOTE — Patient Instructions (Signed)
Negative for COVID, Strep, and Flu. This is likely a virus so I would recommend continuing with supportive measures for now -  rest, hydration, humidifier use, steam showers, warm compresses to sinuses, warm liquids with lemon and honey, and over-the-counter cough, cold, and analgesics as needed.   Since you are going out of town, I will send you an antibiotic to have so you can take if your symptoms do not start improving over the next several days.    Over the counter medications that may be helpful for symptoms:  Guaifenesin 1200 mg extended release tabs twice daily, with plenty of water For cough and congestion Brand name: Mucinex   Pseudoephedrine 30 mg, one or two tabs every 4 to 6 hours For sinus congestion Brand name: Sudafed You must get this from the pharmacy counter.  Oxymetazoline nasal spray each morning, one spray in each nostril, for NO MORE THAN 3 days  For nasal and sinus congestion Brand name: Afrin Saline nasal spray or Saline Nasal Irrigation 3-5 times a day For nasal and sinus congestion Brand names: Ocean or AYR Fluticasone nasal spray, one spray in each nostril, each morning (after oxymetazoline and saline, if used) For nasal and sinus congestion Brand name: Flonase Warm salt water gargles  For sore throat Every few hours as needed Alternate ibuprofen 400-600 mg and acetaminophen 1000 mg every 4-6 hours For fever, body aches, headache Brand names: Motrin or Advil and Tylenol Dextromethorphan 12-hour cough version 30 mg every 12 hours  For cough Brand name: Delsym Stop all other cold medications for now (Nyquil, Dayquil, Tylenol Cold, Theraflu, etc) and other non-prescription cough/cold preparations. Many of these have the same ingredients listed above and could cause an overdose of medication.   Herbal treatments that have been shown to be helpful in some patients include: Vitamin C '1000mg'$  per day Vitamin D 4000iU per day Zinc '100mg'$  per day Quercetin  25-'500mg'$  twice a day Melatonin 5-'10mg'$  at bedtime  General Instructions Allow your body to rest Drink PLENTY of fluids   If you develop severe shortness of breath, uncontrolled fevers, coughing up blood, confusion, chest pain, or signs of dehydration (such as significantly decreased urine amounts or dizziness with standing) please go to the ER.

## 2021-09-16 NOTE — Progress Notes (Signed)
Acute Office Visit  Subjective:     Patient ID: Mindy Anderson, female    DOB: 27-Sep-1950, 71 y.o.   MRN: [id number]  Chief Complaint  Patient presents with   Cough   Headache   Sore Throat    Patient is in today for cough, headache, sore throat.  Patient reports that 2 days ago she started feeling feverish and developed a slight headache and sore throat.  States that symptoms have gradually worsened and she is now having a productive cough with yellow sputum.  States she has also had fatigue, chills, low-grade fevers for the past 2 to 3 days.  She has not noticed any chest pain, dyspnea, wheezing, body aches, rashes.  She was trying a lot of months course however she has been to be going out of town this weekend to be with family at the beach and did not want to get down there feeling worse.  She took a home COVID test yesterday which was negative.  Reports that now her worst symptom is her sore throat.    ROS: All review of systems negative except what is listed in the HPI       Objective:    BP (!) 124/58   Pulse 89   Temp 100.3 F (37.9 C)   Ht '5\' 4"'$  (1.626 m)   Wt 203 lb 9.6 oz (92.4 kg)   SpO2 97%   BMI 34.95 kg/m    Physical Exam Vitals reviewed.  Constitutional:      Appearance: She is well-developed.  HENT:     Head: Normocephalic and atraumatic.     Right Ear: Tympanic membrane normal.     Left Ear: Tympanic membrane normal.     Mouth/Throat:     Tonsils: 1+ on the right. 1+ on the left.  Cardiovascular:     Rate and Rhythm: Normal rate and regular rhythm.  Pulmonary:     Effort: Pulmonary effort is normal.     Breath sounds: Normal breath sounds.  Musculoskeletal:     Cervical back: Normal range of motion and neck supple. Tenderness present.  Lymphadenopathy:     Cervical: Cervical adenopathy present.  Skin:    General: Skin is warm.  Neurological:     General: No focal deficit present.     Mental Status: She is alert and oriented to person,  place, and time. Mental status is at baseline.  Psychiatric:        Mood and Affect: Mood normal.        Speech: Speech normal.        Behavior: Behavior normal.     Results for orders placed or performed in visit on 09/16/21  POCT rapid strep A  Result Value Ref Range   Rapid Strep A Screen Negative Negative  POCT Influenza A/B  Result Value Ref Range   Influenza A, POC Negative Negative   Influenza B, POC Negative Negative  POC COVID-19 BinaxNow  Result Value Ref Range   SARS Coronavirus 2 Ag Negative Negative        Assessment & Plan:   1. Sore throat 2. Acute cough 3. Acute nonintractable headache, unspecified headache type Negative for COVID, Strep, and Flu. This is likely a virus so I would recommend continuing with supportive measures for now -  rest, hydration, humidifier use, steam showers, warm compresses to sinuses, warm liquids with lemon and honey, and over-the-counter cough, cold, and analgesics as needed.   Since you are going out  of town, I will send you an antibiotic to have so you can take if your symptoms do not start improving over the next several days.    - POCT rapid strep A - POCT Influenza A/B - POC COVID-19 BinaxNow - amoxicillin-clavulanate (AUGMENTIN) 875-125 MG tablet; Take 1 tablet by mouth 2 (two) times daily.  Dispense: 20 tablet; Refill: 0   Return if symptoms worsen or fail to improve.  Terrilyn Saver, NP

## 2022-01-04 ENCOUNTER — Other Ambulatory Visit: Payer: Self-pay | Admitting: Family Medicine

## 2022-01-04 DIAGNOSIS — I10 Essential (primary) hypertension: Secondary | ICD-10-CM

## 2022-01-12 ENCOUNTER — Telehealth: Payer: Self-pay

## 2022-01-12 NOTE — Telephone Encounter (Signed)
Caller Name [place] Phone Number [phone] Patient Name Mindy Anderson Patient DOB Apr 08, 1950 Call Type Message Only Information Provided Reason for Call Request for General Office Information Initial Comment Caller states that she received a call yesterday that she needs to reschedule her appt. Additional Comment Caller is returning a call from the office regarding her appt. Office hours provided. Disp. Time Disposition Final User 01/12/2022 12:11:08 PM General Information Provided Yes Virgil Benedict Call Closed By: Virgil Benedict Transaction Date/Time: 01/12/2022 36:92:23 PM (ET)

## 2022-02-14 ENCOUNTER — Other Ambulatory Visit (HOSPITAL_BASED_OUTPATIENT_CLINIC_OR_DEPARTMENT_OTHER): Payer: Self-pay

## 2022-02-14 ENCOUNTER — Encounter: Payer: Self-pay | Admitting: Family Medicine

## 2022-02-14 ENCOUNTER — Ambulatory Visit (INDEPENDENT_AMBULATORY_CARE_PROVIDER_SITE_OTHER): Payer: Medicare HMO | Admitting: Family Medicine

## 2022-02-14 VITALS — BP 120/76 | HR 73 | Temp 98.4°F | Resp 18 | Ht 64.0 in | Wt 205.4 lb

## 2022-02-14 DIAGNOSIS — Z23 Encounter for immunization: Secondary | ICD-10-CM

## 2022-02-14 DIAGNOSIS — I10 Essential (primary) hypertension: Secondary | ICD-10-CM

## 2022-02-14 DIAGNOSIS — E559 Vitamin D deficiency, unspecified: Secondary | ICD-10-CM | POA: Diagnosis not present

## 2022-02-14 LAB — CBC WITH DIFFERENTIAL/PLATELET
Basophils Absolute: 0.1 10*3/uL (ref 0.0–0.1)
Basophils Relative: 0.8 % (ref 0.0–3.0)
Eosinophils Absolute: 0.3 10*3/uL (ref 0.0–0.7)
Eosinophils Relative: 3.6 % (ref 0.0–5.0)
HCT: 42.2 % (ref 36.0–46.0)
Hemoglobin: 13.8 g/dL (ref 12.0–15.0)
Lymphocytes Relative: 21.3 % (ref 12.0–46.0)
Lymphs Abs: 1.5 10*3/uL (ref 0.7–4.0)
MCHC: 32.8 g/dL (ref 30.0–36.0)
MCV: 92.1 fl (ref 78.0–100.0)
Monocytes Absolute: 0.6 10*3/uL (ref 0.1–1.0)
Monocytes Relative: 7.9 % (ref 3.0–12.0)
Neutro Abs: 4.7 10*3/uL (ref 1.4–7.7)
Neutrophils Relative %: 66.4 % (ref 43.0–77.0)
Platelets: 228 10*3/uL (ref 150.0–400.0)
RBC: 4.58 Mil/uL (ref 3.87–5.11)
RDW: 13 % (ref 11.5–15.5)
WBC: 7 10*3/uL (ref 4.0–10.5)

## 2022-02-14 LAB — COMPREHENSIVE METABOLIC PANEL
ALT: 16 U/L (ref 0–35)
AST: 16 U/L (ref 0–37)
Albumin: 3.9 g/dL (ref 3.5–5.2)
Alkaline Phosphatase: 83 U/L (ref 39–117)
BUN: 8 mg/dL (ref 6–23)
CO2: 32 mEq/L (ref 19–32)
Calcium: 9.3 mg/dL (ref 8.4–10.5)
Chloride: 103 mEq/L (ref 96–112)
Creatinine, Ser: 0.75 mg/dL (ref 0.40–1.20)
GFR: 79.01 mL/min (ref >60.00)
Glucose, Bld: 111 mg/dL — ABNORMAL HIGH (ref 70–99)
Potassium: 4.4 mEq/L (ref 3.5–5.1)
Sodium: 141 mEq/L (ref 135–145)
Total Bilirubin: 0.5 mg/dL (ref 0.2–1.2)
Total Protein: 7 g/dL (ref 6.0–8.3)

## 2022-02-14 LAB — LIPID PANEL
Cholesterol: 158 mg/dL (ref 0–200)
HDL: 50.5 mg/dL (ref >39.00)
LDL Cholesterol: 96 mg/dL (ref 0–99)
NonHDL: 107.47
Total CHOL/HDL Ratio: 3
Triglycerides: 56 mg/dL (ref 0.0–149.0)
VLDL: 11.2 mg/dL (ref 0.0–40.0)

## 2022-02-14 LAB — VITAMIN D 25 HYDROXY (VIT D DEFICIENCY, FRACTURES): VITD: 38.33 ng/mL (ref 30.00–100.00)

## 2022-02-14 MED ORDER — BOOSTRIX 5-2.5-18.5 LF-MCG/0.5 IM SUSY
PREFILLED_SYRINGE | INTRAMUSCULAR | 0 refills | Status: DC
  Filled 2022-02-14: qty 0.5, 1d supply, fill #0

## 2022-02-14 NOTE — Assessment & Plan Note (Signed)
Well controlled, no changes to meds. Encouraged heart healthy diet such as the DASH diet and exercise as tolerated.  °

## 2022-02-14 NOTE — Progress Notes (Addendum)
Subjective:   By signing my name below, I, Mindy Anderson, attest that this documentation has been prepared under the direction and in the presence of Mindy Held DO 02/14/2022   Patient ID: Mindy Anderson, female    DOB: 1950/12/31, 71 y.o.   MRN: [id number]  Chief Complaint  Patient presents with   Follow-up    HPI Patient is in today for an office visit  She reports that she had a fall sometime in September. She was trying to sit on her rolling chair and she fell from it. She reports that her ankle was hurting for 2-3 weeks. It was previously swollen but symptoms are resolved. She is elevating her foot to prevent symptoms. She denies of any swelling during today's visit.  She thought she broke her hand at the time. The chair she used in her kitchen only had 4 / 5 wheels and would constantly dump her out of the chair.    She is interested in receiving an influenza vaccine during today's visit. She is due for a tetanus vaccine.   Past Medical History:  Diagnosis Date   Arthritis    hands, knees    Complication of anesthesia    Family history of adverse reaction to anesthesia    mother of pt. wakes slowly   Hypertension    PONV (postoperative nausea and vomiting)     Past Surgical History:  Procedure Laterality Date   Band Aid surgery  1980's   CHOLECYSTECTOMY N/A 06/25/2014   Procedure: LAPAROSCOPIC CHOLECYSTECTOMY;  Surgeon: Ralene Ok, MD;  Location: [place];  Service: General;  Laterality: N/A;   TUBAL LIGATION      Family History  Problem Relation Age of Onset   Uterine cancer Mother    Heart disease Mother 36       chf   Breast cancer Mother    Cervical cancer Mother    Cancer Mother        skin   COPD Father    Heart disease Father 72       MI   Heart disease Brother        cabg with stents   Cancer Brother        Skin cancer   Heart disease Brother    Heart attack Brother 54   Lung cancer Sister    Liver cancer Sister    Breast cancer  Sister    Colon cancer Sister    Colon polyps Daughter    Rectal cancer Neg Hx    Stomach cancer Neg Hx    Esophageal cancer Neg Hx     Social History   Socioeconomic History   Marital status: Married    Spouse name: Not on file   Number of children: Not on file   Years of education: Not on file   Highest education level: Not on file  Occupational History   Occupation: retired  Tobacco Use   Smoking status: Never   Smokeless tobacco: Never  Substance and Sexual Activity   Alcohol use: No    Alcohol/week: 0.0 standard drinks of alcohol   Drug use: No   Sexual activity: Never    Partners: Male  Other Topics Concern   Not on file  Social History Narrative   Exercise-- no   Social Determinants of Health   Financial Resource Strain: Low Risk  (08/16/2021)   Overall Financial Resource Strain (CARDIA)    Difficulty of Paying Living Expenses: Not hard at  all  Food Insecurity: No Food Insecurity (08/16/2021)   Hunger Vital Sign    Worried About Running Out of Food in the Last Year: Never true    Ran Out of Food in the Last Year: Never true  Transportation Needs: No Transportation Needs (08/16/2021)   PRAPARE - Hydrologist (Medical): No    Lack of Transportation (Non-Medical): No  Physical Activity: Inactive (08/16/2021)   Exercise Vital Sign    Days of Exercise per Week: 0 days    Minutes of Exercise per Session: 0 min  Stress: No Stress Concern Present (08/16/2021)   [place]    Feeling of Stress : Not at all  Social Connections: Moderately Integrated (08/16/2021)   Social Connection and Isolation Panel [NHANES]    Frequency of Communication with Friends and Family: More than three times a week    Frequency of Social Gatherings with Friends and Family: More than three times a week    Attends Religious Services: More than 4 times per year    Active Member of [employer] or  Organizations: No    Attends Archivist Meetings: Never    Marital Status: Married  Human resources officer Violence: Not At Risk (08/16/2021)   Humiliation, Afraid, Rape, and Kick questionnaire    Fear of Current or Ex-Partner: No    Emotionally Abused: No    Physically Abused: No    Sexually Abused: No    Outpatient Medications Prior to Visit  Medication Sig Dispense Refill   amLODipine (NORVASC) 5 MG tablet Take 1 tablet (5 mg total) by mouth daily. 90 tablet 1   amoxicillin-clavulanate (AUGMENTIN) 875-125 MG tablet Take 1 tablet by mouth 2 (two) times daily. 20 tablet 0   aspirin 81 MG tablet Take 81 mg by mouth daily.     cholecalciferol (VITAMIN D3) 25 MCG (1000 UNIT) tablet Take 1,000 Units by mouth daily.     diclofenac Sodium (VOLTAREN) 1 % GEL Apply 4 g topically 4 (four) times daily. 150 g 2   Fluticasone Propionate (FLONASE NA) Place into the nose.     lisinopril-hydrochlorothiazide (ZESTORETIC) 10-12.5 MG tablet Take 1 tablet by mouth daily. 90 tablet 1   meloxicam (MOBIC) 7.5 MG tablet TAKE 1 TO 2 TABLETS EVERY DAY AS NEEDED 60 tablet 0   No facility-administered medications prior to visit.    Allergies  Allergen Reactions   Hydrocodone Itching and Nausea And Vomiting   Tramadol Nausea Only    Review of Systems  Constitutional:  Negative for chills, fever and malaise/fatigue.  HENT:  Negative for congestion and hearing loss.   Eyes:  Negative for discharge.  Respiratory:  Negative for cough, sputum production and shortness of breath.   Cardiovascular:  Negative for chest pain, palpitations and leg swelling.  Gastrointestinal:  Negative for abdominal pain, blood in stool, constipation, diarrhea, heartburn, nausea and vomiting.  Genitourinary:  Negative for dysuria, frequency, hematuria and urgency.  Musculoskeletal:  Positive for falls. Negative for back pain and myalgias.  Skin:  Negative for rash.  Neurological:  Negative for dizziness, sensory change, loss  of consciousness, weakness and headaches.  Endo/Heme/Allergies:  Negative for environmental allergies. Does not bruise/bleed easily.  Psychiatric/Behavioral:  Negative for depression and suicidal ideas. The patient is not nervous/anxious and does not have insomnia.        Objective:    Physical Exam Vitals and nursing note reviewed.  Constitutional:  General: She is not in acute distress.    Appearance: Normal appearance. She is not ill-appearing.  HENT:     Head: Normocephalic and atraumatic.     Right Ear: External ear normal.     Left Ear: External ear normal.  Eyes:     Extraocular Movements: Extraocular movements intact.     Pupils: Pupils are equal, round, and reactive to light.  Cardiovascular:     Rate and Rhythm: Normal rate and regular rhythm.     Heart sounds: Normal heart sounds. No murmur heard.    No gallop.  Pulmonary:     Effort: Pulmonary effort is normal. No respiratory distress.     Breath sounds: Normal breath sounds. No wheezing or rales.  Musculoskeletal:     Right lower leg: Pitting Edema present.     Left lower leg: Pitting Edema present.  Skin:    General: Skin is warm and dry.  Neurological:     Mental Status: She is alert and oriented to person, place, and time.  Psychiatric:        Judgment: Judgment normal.     BP 120/76 (BP Location: Left Arm, Patient Position: Sitting, Cuff Size: Large)   Pulse 73   Temp 98.4 F (36.9 C) (Oral)   Resp 18   Ht '5\' 4"'$  (1.626 m)   Wt 205 lb 6.4 oz (93.2 kg)   SpO2 98%   BMI 35.26 kg/m  Wt Readings from Last 3 Encounters:  02/14/22 205 lb 6.4 oz (93.2 kg)  09/16/21 203 lb 9.6 oz (92.4 kg)  08/16/21 207 lb 12.8 oz (94.3 kg)    Diabetic Foot Exam - Simple   No data filed    Lab Results  Component Value Date   WBC 6.1 08/16/2021   HGB 13.3 08/16/2021   HCT 39.8 08/16/2021   PLT 205.0 08/16/2021   GLUCOSE 113 (H) 08/16/2021   CHOL 157 08/16/2021   TRIG 138.0 08/16/2021   HDL 44.10  08/16/2021   LDLCALC 85 08/16/2021   ALT 12 08/16/2021   AST 13 08/16/2021   NA 142 08/16/2021   K 4.4 08/16/2021   CL 104 08/16/2021   CREATININE 0.81 08/16/2021   BUN 13 08/16/2021   CO2 26 08/16/2021   TSH 1.54 04/02/2012   MICROALBUR 0.6 01/23/2020    Lab Results  Component Value Date   TSH 1.54 04/02/2012   Lab Results  Component Value Date   WBC 6.1 08/16/2021   HGB 13.3 08/16/2021   HCT 39.8 08/16/2021   MCV 90.1 08/16/2021   PLT 205.0 08/16/2021   Lab Results  Component Value Date   NA 142 08/16/2021   K 4.4 08/16/2021   CO2 26 08/16/2021   GLUCOSE 113 (H) 08/16/2021   BUN 13 08/16/2021   CREATININE 0.81 08/16/2021   BILITOT 0.4 08/16/2021   ALKPHOS 81 08/16/2021   AST 13 08/16/2021   ALT 12 08/16/2021   PROT 7.0 08/16/2021   ALBUMIN 3.9 08/16/2021   CALCIUM 9.2 08/16/2021   ANIONGAP 9 06/19/2014   GFR 72.29 08/16/2021   Lab Results  Component Value Date   CHOL 157 08/16/2021   Lab Results  Component Value Date   HDL 44.10 08/16/2021   Lab Results  Component Value Date   LDLCALC 85 08/16/2021   Lab Results  Component Value Date   TRIG 138.0 08/16/2021   Lab Results  Component Value Date   CHOLHDL 4 08/16/2021   No results found for: "HGBA1C"  Assessment & Plan:   Problem List Items Addressed This Visit       Unprioritized   Essential hypertension    Well controlled, no changes to meds. Encouraged heart healthy diet such as the DASH diet and exercise as tolerated.        Other Visit Diagnoses     Vitamin D deficiency    -  Primary   Relevant Orders   CBC with Differential/Platelet   Comprehensive metabolic panel   Lipid panel   VITAMIN D 25 Hydroxy (Vit-D Deficiency, Fractures)   Primary hypertension       Relevant Orders   CBC with Differential/Platelet   Comprehensive metabolic panel   Lipid panel   VITAMIN D 25 Hydroxy (Vit-D Deficiency, Fractures)   Need for influenza vaccination       Relevant Orders   Flu  Vaccine QUAD High Dose(Fluad) (Completed)      No orders of the defined types were placed in this encounter.   IAnn Held, DO, personally preformed the services described in this documentation.  All medical record entries made by the scribe were at my direction and in my presence.  I have reviewed the chart and discharge instructions (if applicable) and agree that the record reflects my personal performance and is accurate and complete. 02/14/2022   I,Amber Collins,acting as a scribe for Mindy Held, DO.,have documented all relevant documentation on the behalf of Mindy Held, DO,as directed by  Mindy Held, DO while in the presence of Mindy Held, DO.    Mindy Held, DO

## 2022-02-14 NOTE — Patient Instructions (Signed)

## 2022-02-17 ENCOUNTER — Ambulatory Visit: Payer: Medicare HMO | Admitting: Family Medicine

## 2022-08-02 ENCOUNTER — Telehealth: Payer: Self-pay | Admitting: Family Medicine

## 2022-08-02 NOTE — Telephone Encounter (Signed)
Contacted Mariena Meares [id number] to schedule their annual wellness visit. Appointment made for 08/18/2022.  Verlee Rossetti; Care Guide Ambulatory Clinical Support  l [facility] Direct Dial: [phone]

## 2022-08-15 ENCOUNTER — Encounter: Payer: Self-pay | Admitting: Family Medicine

## 2022-08-15 ENCOUNTER — Ambulatory Visit (INDEPENDENT_AMBULATORY_CARE_PROVIDER_SITE_OTHER): Payer: Medicare HMO | Admitting: Family Medicine

## 2022-08-15 VITALS — BP 128/80 | HR 80 | Temp 98.2°F | Resp 18 | Ht 64.0 in | Wt 215.6 lb

## 2022-08-15 DIAGNOSIS — M25561 Pain in right knee: Secondary | ICD-10-CM

## 2022-08-15 DIAGNOSIS — R2689 Other abnormalities of gait and mobility: Secondary | ICD-10-CM | POA: Diagnosis not present

## 2022-08-15 DIAGNOSIS — G8929 Other chronic pain: Secondary | ICD-10-CM

## 2022-08-15 DIAGNOSIS — M25562 Pain in left knee: Secondary | ICD-10-CM

## 2022-08-15 DIAGNOSIS — I10 Essential (primary) hypertension: Secondary | ICD-10-CM

## 2022-08-15 DIAGNOSIS — E559 Vitamin D deficiency, unspecified: Secondary | ICD-10-CM | POA: Diagnosis not present

## 2022-08-15 DIAGNOSIS — M170 Bilateral primary osteoarthritis of knee: Secondary | ICD-10-CM

## 2022-08-15 LAB — CBC WITH DIFFERENTIAL/PLATELET
Basophils Absolute: 0 10*3/uL (ref 0.0–0.1)
Basophils Relative: 0.5 % (ref 0.0–3.0)
Eosinophils Absolute: 0.3 10*3/uL (ref 0.0–0.7)
Eosinophils Relative: 3.7 % (ref 0.0–5.0)
HCT: 41.5 % (ref 36.0–46.0)
Hemoglobin: 13.9 g/dL (ref 12.0–15.0)
Lymphocytes Relative: 23.5 % (ref 12.0–46.0)
Lymphs Abs: 1.8 10*3/uL (ref 0.7–4.0)
MCHC: 33.5 g/dL (ref 30.0–36.0)
MCV: 91.2 fl (ref 78.0–100.0)
Monocytes Absolute: 0.7 10*3/uL (ref 0.1–1.0)
Monocytes Relative: 8.9 % (ref 3.0–12.0)
Neutro Abs: 4.9 10*3/uL (ref 1.4–7.7)
Neutrophils Relative %: 63.4 % (ref 43.0–77.0)
Platelets: 221 10*3/uL (ref 150.0–400.0)
RBC: 4.55 Mil/uL (ref 3.87–5.11)
RDW: 13.9 % (ref 11.5–15.5)
WBC: 7.7 10*3/uL (ref 4.0–10.5)

## 2022-08-15 LAB — COMPREHENSIVE METABOLIC PANEL
ALT: 13 U/L (ref 0–35)
AST: 16 U/L (ref 0–37)
Albumin: 3.8 g/dL (ref 3.5–5.2)
Alkaline Phosphatase: 80 U/L (ref 39–117)
BUN: 12 mg/dL (ref 6–23)
CO2: 29 mEq/L (ref 19–32)
Calcium: 9 mg/dL (ref 8.4–10.5)
Chloride: 103 mEq/L (ref 96–112)
Creatinine, Ser: 0.7 mg/dL (ref 0.40–1.20)
GFR: 85.53 mL/min (ref >60.00)
Glucose, Bld: 115 mg/dL — ABNORMAL HIGH (ref 70–99)
Potassium: 3.9 mEq/L (ref 3.5–5.1)
Sodium: 142 mEq/L (ref 135–145)
Total Bilirubin: 0.6 mg/dL (ref 0.2–1.2)
Total Protein: 7.1 g/dL (ref 6.0–8.3)

## 2022-08-15 LAB — LIPID PANEL
Cholesterol: 150 mg/dL (ref 0–200)
HDL: 46 mg/dL (ref >39.00)
LDL Cholesterol: 89 mg/dL (ref 0–99)
NonHDL: 103.98
Total CHOL/HDL Ratio: 3
Triglycerides: 76 mg/dL (ref 0.0–149.0)
VLDL: 15.2 mg/dL (ref 0.0–40.0)

## 2022-08-15 LAB — VITAMIN D 25 HYDROXY (VIT D DEFICIENCY, FRACTURES): VITD: 33.34 ng/mL (ref 30.00–100.00)

## 2022-08-15 NOTE — Assessment & Plan Note (Signed)
Pt refused PT at this time  She will start exercise in a local church---- chair yoga etc

## 2022-08-15 NOTE — Progress Notes (Signed)
Established Patient Office Visit  Subjective   Patient ID: Mindy Anderson, female    DOB: 09/07/1950  Age: 72 y.o. MRN: [id number]  Chief Complaint  Patient presents with   Hypertension   Follow-up   Diabetes    HPI Pt is here f/u bp and dm.  Pt c/o balance issues .  She c/o pink eye in January and she used eye drops and it resolved.   She started making copper bracelets this winter but had to stop when I acted up.  In February she fell over in her rolling chair and she hit her head and smashed her wrist. And she felt sick to her stomach-- she did not go to er.    She wore a splint  for a few months. She thinks she has ring worm again but used otc cream which took care of it .   Patient Active Problem List   Diagnosis Date Noted   Primary osteoarthritis of both knees 08/15/2022   Balance problem 08/15/2022   Chronic pain of right ankle 08/16/2021   Chest pain 08/16/2021   COVID-19 08/20/2020   Sore throat 08/20/2020   Bronchitis 08/20/2020   Chronic pain of both knees 08/06/2020   Right hip pain 01/23/2020   Low back pain with radiation 01/23/2020   Diarrhea 12/31/2018   Right knee pain 07/05/2017   Preventative health care 04/11/2016   Vaginitis and vulvovaginitis 11/03/2014   UNSPECIFIED VITAMIN D DEFICIENCY 05/13/2009   ACTINIC KERATOSIS 02/11/2008   POSTMENOPAUSAL STATUS 02/11/2008   OSTEOARTHRITIS, KNEES, BILATERAL 09/27/2006   Primary hypertension 09/22/2006   Past Medical History:  Diagnosis Date   Arthritis    hands, knees    Complication of anesthesia    Family history of adverse reaction to anesthesia    mother of pt. wakes slowly   Hypertension    PONV (postoperative nausea and vomiting)    Past Surgical History:  Procedure Laterality Date   Band Aid surgery  1980's   CHOLECYSTECTOMY N/A 06/25/2014   Procedure: LAPAROSCOPIC CHOLECYSTECTOMY;  Surgeon: Axel Filler, MD;  Location: [facility] OR;  Service: General;  Laterality: N/A;   TUBAL LIGATION      Social History   Tobacco Use   Smoking status: Never   Smokeless tobacco: Never  Substance Use Topics   Alcohol use: No    Alcohol/week: 0.0 standard drinks of alcohol   Drug use: No   Social History   Socioeconomic History   Marital status: Married    Spouse name: Not on file   Number of children: Not on file   Years of education: Not on file   Highest education level: Not on file  Occupational History   Occupation: retired  Tobacco Use   Smoking status: Never   Smokeless tobacco: Never  Substance and Sexual Activity   Alcohol use: No    Alcohol/week: 0.0 standard drinks of alcohol   Drug use: No   Sexual activity: Never    Partners: Male  Other Topics Concern   Not on file  Social History Narrative   Exercise-- no   Social Determinants of Health   Financial Resource Strain: Low Risk  (08/16/2021)   Overall Financial Resource Strain (CARDIA)    Difficulty of Paying Living Expenses: Not hard at all  Food Insecurity: No Food Insecurity (08/16/2021)   Hunger Vital Sign    Worried About Running Out of Food in the Last Year: Never true    Ran Out of Food in  the Last Year: Never true  Transportation Needs: No Transportation Needs (08/16/2021)   PRAPARE - Administrator, Civil Service (Medical): No    Lack of Transportation (Non-Medical): No  Physical Activity: Inactive (08/16/2021)   Exercise Vital Sign    Days of Exercise per Week: 0 days    Minutes of Exercise per Session: 0 min  Stress: No Stress Concern Present (08/16/2021)   [employer] of Occupational Health - Occupational Stress Questionnaire    Feeling of Stress : Not at all  Social Connections: Moderately Integrated (08/16/2021)   Social Connection and Isolation Panel [NHANES]    Frequency of Communication with Friends and Family: More than three times a week    Frequency of Social Gatherings with Friends and Family: More than three times a week    Attends Religious Services: More than 4  times per year    Active Member of [employer] or Organizations: No    Attends Banker Meetings: Never    Marital Status: Married  Catering manager Violence: Not At Risk (08/16/2021)   Humiliation, Afraid, Rape, and Kick questionnaire    Fear of Current or Ex-Partner: No    Emotionally Abused: No    Physically Abused: No    Sexually Abused: No   Family Status  Relation Name Status   Mother  Deceased   Father  Deceased at age 62       copd   Brother Peyton Najjar Alive   Brother Marketing executive Alive   Sister  Deceased   Daughter  (Not Specified)   Neg Hx  (Not Specified)   Family History  Problem Relation Age of Onset   Uterine cancer Mother    Heart disease Mother 31       chf   Breast cancer Mother    Cervical cancer Mother    Cancer Mother        skin   COPD Father    Heart disease Father 46       MI   Heart disease Brother        cabg with stents   Cancer Brother        Skin cancer   Heart disease Brother    Heart attack Brother 20   Lung cancer Sister    Liver cancer Sister    Breast cancer Sister    Colon cancer Sister    Colon polyps Daughter    Rectal cancer Neg Hx    Stomach cancer Neg Hx    Esophageal cancer Neg Hx    Allergies  Allergen Reactions   Hydrocodone Itching and Nausea And Vomiting   Tramadol Nausea Only      Review of Systems  Constitutional:  Negative for fever and malaise/fatigue.  HENT:  Negative for congestion.   Eyes:  Negative for blurred vision.  Respiratory:  Negative for cough and shortness of breath.   Cardiovascular:  Negative for chest pain, palpitations and leg swelling.  Gastrointestinal:  Negative for abdominal pain, blood in stool, nausea and vomiting.  Genitourinary:  Negative for dysuria and frequency.  Musculoskeletal:  Negative for back pain and falls.  Skin:  Negative for rash.  Neurological:  Negative for dizziness, loss of consciousness and headaches.  Endo/Heme/Allergies:  Negative for environmental allergies.   Psychiatric/Behavioral:  Negative for depression. The patient is not nervous/anxious.       Objective:     BP 128/80 (BP Location: Left Arm, Patient Position: Sitting, Cuff Size: Large)   Pulse  80   Temp 98.2 F (36.8 C) (Oral)   Resp 18   Ht 5\' 4"  (1.626 m)   Wt 215 lb 9.6 oz (97.8 kg)   SpO2 97%   BMI 37.01 kg/m  BP Readings from Last 3 Encounters:  08/15/22 128/80  02/14/22 120/76  09/16/21 (!) 124/58   Wt Readings from Last 3 Encounters:  08/15/22 215 lb 9.6 oz (97.8 kg)  02/14/22 205 lb 6.4 oz (93.2 kg)  09/16/21 203 lb 9.6 oz (92.4 kg)   SpO2 Readings from Last 3 Encounters:  08/15/22 97%  02/14/22 98%  09/16/21 97%      Physical Exam Vitals and nursing note reviewed.  Constitutional:      Appearance: She is well-developed.  HENT:     Head: Normocephalic and atraumatic.  Eyes:     Conjunctiva/sclera: Conjunctivae normal.  Neck:     Thyroid: No thyromegaly.     Vascular: No carotid bruit or JVD.  Cardiovascular:     Rate and Rhythm: Normal rate and regular rhythm.     Heart sounds: Normal heart sounds. No murmur heard. Pulmonary:     Effort: Pulmonary effort is normal. No respiratory distress.     Breath sounds: Normal breath sounds. No wheezing or rales.  Chest:     Chest wall: No tenderness.  Musculoskeletal:        General: Tenderness present.     Cervical back: Normal range of motion and neck supple.     Right lower leg: No edema.  Neurological:     Mental Status: She is alert and oriented to person, place, and time.      No results found for any visits on 08/15/22.  Last CBC Lab Results  Component Value Date   WBC 7.0 02/14/2022   HGB 13.8 02/14/2022   HCT 42.2 02/14/2022   MCV 92.1 02/14/2022   MCH 29.3 04/21/2017   RDW 13.0 02/14/2022   PLT 228.0 02/14/2022   Last metabolic panel Lab Results  Component Value Date   GLUCOSE 111 (H) 02/14/2022   NA 141 02/14/2022   K 4.4 02/14/2022   CL 103 02/14/2022   CO2 32 02/14/2022    BUN 8 02/14/2022   CREATININE 0.75 02/14/2022   GFRNONAA 89 (L) 06/19/2014   CALCIUM 9.3 02/14/2022   PROT 7.0 02/14/2022   ALBUMIN 3.9 02/14/2022   BILITOT 0.5 02/14/2022   ALKPHOS 83 02/14/2022   AST 16 02/14/2022   ALT 16 02/14/2022   ANIONGAP 9 06/19/2014   Last lipids Lab Results  Component Value Date   CHOL 158 02/14/2022   HDL 50.50 02/14/2022   LDLCALC 96 02/14/2022   TRIG 56.0 02/14/2022   CHOLHDL 3 02/14/2022   Last hemoglobin A1c No results found for: "HGBA1C" Last thyroid functions Lab Results  Component Value Date   TSH 1.54 04/02/2012   Last vitamin D Lab Results  Component Value Date   VD25OH 38.33 02/14/2022   Last vitamin B12 and Folate No results found for: "VITAMINB12", "FOLATE"    The 10-year ASCVD risk score (Arnett DK, et al., 2019) is: 16.6%    Assessment & Plan:   Problem List Items Addressed This Visit       Unprioritized   UNSPECIFIED VITAMIN D DEFICIENCY   Relevant Orders   VITAMIN D 25 Hydroxy (Vit-D Deficiency, Fractures)   Primary osteoarthritis of both knees   Relevant Orders   Ambulatory referral to Sports Medicine   Primary hypertension - Primary  Well controlled, no changes to meds. Encouraged heart healthy diet such as the DASH diet and exercise as tolerated.        Relevant Orders   CBC with Differential/Platelet   Comprehensive metabolic panel   Lipid panel   Chronic pain of both knees    Refer to sport med      Balance problem    Pt refused PT at this time  She will start exercise in a local church---- chair yoga etc       Relevant Orders   CBC with Differential/Platelet   Comprehensive metabolic panel   Lipid panel    Return in about 6 months (around 02/15/2023), or if symptoms worsen or fail to improve, for annual exam, fasting.    Donato Schultz, DO

## 2022-08-15 NOTE — Patient Instructions (Signed)

## 2022-08-15 NOTE — Assessment & Plan Note (Signed)
Refer to sport med.  

## 2022-08-15 NOTE — Assessment & Plan Note (Signed)
Well controlled, no changes to meds. Encouraged heart healthy diet such as the DASH diet and exercise as tolerated.  °

## 2022-08-18 ENCOUNTER — Ambulatory Visit (INDEPENDENT_AMBULATORY_CARE_PROVIDER_SITE_OTHER): Payer: Medicare HMO | Admitting: *Deleted

## 2022-08-18 DIAGNOSIS — Z0000 Encounter for general adult medical examination without abnormal findings: Secondary | ICD-10-CM

## 2022-08-18 NOTE — Progress Notes (Signed)
Subjective:   Mindy Anderson is a 72 y.o. female who presents for Medicare Annual (Subsequent) preventive examination.  I connected with  Nyirah Giegerich [facility] on 08/18/22 by a audio enabled telemedicine application and verified that I am speaking with the correct person using two identifiers.  Patient Location: Home  Provider Location: Office/Clinic  I discussed the limitations of evaluation and management by telemedicine. The patient expressed understanding and agreed to proceed.   Review of Systems     Cardiac Risk Factors include: advanced age (>49men, >20 women);hypertension;obesity (BMI >30kg/m2)     Objective:    There were no vitals filed for this visit. There is no height or weight on file to calculate BMI.     08/18/2022    2:24 PM 08/16/2021   10:15 AM 08/06/2020    9:31 AM 05/21/2019    3:22 PM 05/17/2018   10:50 AM 04/21/2017    1:16 PM 04/11/2016    2:14 PM  Advanced Directives  Does Patient Have a Medical Advance Directive? No No No No No Yes No  Does patient want to make changes to medical advance directive?      No - Patient declined Yes (MAU/Ambulatory/Procedural Areas - Information given)  Would patient like information on creating a medical advance directive? No - Patient declined No - Patient declined No - Patient declined No - Patient declined No - Patient declined      Current Medications (verified) Outpatient Encounter Medications as of 08/18/2022  Medication Sig   amLODipine (NORVASC) 5 MG tablet Take 1 tablet (5 mg total) by mouth daily.   aspirin 81 MG tablet Take 81 mg by mouth daily.   cholecalciferol (VITAMIN D3) 25 MCG (1000 UNIT) tablet Take 1,000 Units by mouth daily.   diclofenac Sodium (VOLTAREN) 1 % GEL Apply 4 g topically 4 (four) times daily.   Fluticasone Propionate (FLONASE NA) Place into the nose.   lisinopril-hydrochlorothiazide (ZESTORETIC) 10-12.5 MG tablet Take 1 tablet by mouth daily.   meloxicam (MOBIC) 7.5 MG tablet TAKE 1 TO 2  TABLETS EVERY DAY AS NEEDED   No facility-administered encounter medications on file as of 08/18/2022.    Allergies (verified) Hydrocodone and Tramadol   History: Past Medical History:  Diagnosis Date   Arthritis    hands, knees    Complication of anesthesia    Family history of adverse reaction to anesthesia    mother of pt. wakes slowly   Hypertension    PONV (postoperative nausea and vomiting)    Past Surgical History:  Procedure Laterality Date   Band Aid surgery  1980's   CHOLECYSTECTOMY N/A 06/25/2014   Procedure: LAPAROSCOPIC CHOLECYSTECTOMY;  Surgeon: Axel Filler, MD;  Location: [facility] OR;  Service: General;  Laterality: N/A;   TUBAL LIGATION     Family History  Problem Relation Age of Onset   Uterine cancer Mother    Heart disease Mother 20       chf   Breast cancer Mother    Cervical cancer Mother    Cancer Mother        skin   COPD Father    Heart disease Father 69       MI   Heart disease Brother        cabg with stents   Cancer Brother        Skin cancer   Heart disease Brother    Heart attack Brother 23   Lung cancer Sister    Liver cancer Sister  Breast cancer Sister    Colon cancer Sister    Colon polyps Daughter    Rectal cancer Neg Hx    Stomach cancer Neg Hx    Esophageal cancer Neg Hx    Social History   Socioeconomic History   Marital status: Married    Spouse name: Not on file   Number of children: Not on file   Years of education: Not on file   Highest education level: Not on file  Occupational History   Occupation: retired  Tobacco Use   Smoking status: Never   Smokeless tobacco: Never  Substance and Sexual Activity   Alcohol use: No    Alcohol/week: 0.0 standard drinks of alcohol   Drug use: No   Sexual activity: Never    Partners: Male  Other Topics Concern   Not on file  Social History Narrative   Exercise-- no   Social Determinants of Health   Financial Resource Strain: Low Risk  (08/16/2021)   Overall  Financial Resource Strain (CARDIA)    Difficulty of Paying Living Expenses: Not hard at all  Food Insecurity: No Food Insecurity (08/18/2022)   Hunger Vital Sign    Worried About Running Out of Food in the Last Year: Never true    Ran Out of Food in the Last Year: Never true  Transportation Needs: No Transportation Needs (08/18/2022)   PRAPARE - Administrator, Civil Service (Medical): No    Lack of Transportation (Non-Medical): No  Physical Activity: Inactive (08/16/2021)   Exercise Vital Sign    Days of Exercise per Week: 0 days    Minutes of Exercise per Session: 0 min  Stress: No Stress Concern Present (08/16/2021)   [employer] of Occupational Health - Occupational Stress Questionnaire    Feeling of Stress : Not at all  Social Connections: Moderately Integrated (08/16/2021)   Social Connection and Isolation Panel [NHANES]    Frequency of Communication with Friends and Family: More than three times a week    Frequency of Social Gatherings with Friends and Family: More than three times a week    Attends Religious Services: More than 4 times per year    Active Member of [employer] or Organizations: No    Attends Engineer, structural: Never    Marital Status: Married    Tobacco Counseling Counseling given: Not Answered   Clinical Intake:  Pre-visit preparation completed: Yes  Pain : No/denies pain  BMI - recorded: 37.01 Nutritional Status: BMI > 30  Obese Nutritional Risks: None Diabetes: No  How often do you need to have someone help you when you read instructions, pamphlets, or other written materials from your doctor or pharmacy?: 1 - Never   Activities of Daily Living    08/18/2022    2:30 PM  In your present state of health, do you have any difficulty performing the following activities:  Hearing? 0  Vision? 0  Difficulty concentrating or making decisions? 0  Comment some forgetfulness  Walking or climbing stairs? 1  Dressing or bathing? 0   Doing errands, shopping? 0  Preparing Food and eating ? N  Using the Toilet? N  In the past six months, have you accidently leaked urine? Y  Do you have problems with loss of bowel control? Y  Comment occasionally  Managing your Medications? N  Managing your Finances? N  Housekeeping or managing your Housekeeping? N    Patient Care Team: Zola Button, Grayling Congress, DO as PCP -  Ozella Rocks, MD as Consulting Physician (Ophthalmology) Lenda Kelp, MD as Consulting Physician (Sports Medicine)  Indicate any recent Medical Services you may have received from other than [facility] providers in the past year (date may be approximate).     Assessment:   This is a routine wellness examination for Banessa.  Hearing/Vision screen No results found.  Dietary issues and exercise activities discussed: Current Exercise Habits: The patient does not participate in regular exercise at present, Exercise limited by: orthopedic condition(s) (arthritis)   Goals Addressed   None    Depression Screen    08/18/2022    2:29 PM 02/14/2022    9:50 AM 08/16/2021   10:16 AM 08/16/2021    9:16 AM 08/06/2020    9:34 AM 08/06/2020    8:59 AM 05/21/2019    3:24 PM  PHQ 2/9 Scores  PHQ - 2 Score 0 0 0 0 0 0 0    Fall Risk    08/18/2022    2:25 PM 08/15/2022   10:05 AM 02/14/2022    9:49 AM 08/16/2021   10:15 AM 08/16/2021    9:15 AM  Fall Risk   Falls in the past year? 0 0 0 1 0  Number falls in past yr: 0 0 1 0 0  Injury with Fall? 0 0 1 0 0  Risk for fall due to : No Fall Risks  History of fall(s) No Fall Risks Impaired balance/gait;Impaired mobility  Follow up Falls evaluation completed Falls evaluation completed Falls evaluation completed Falls evaluation completed Falls evaluation completed    FALL RISK PREVENTION PERTAINING TO THE HOME:  Any stairs in or around the home? Yes  If so, are there any without handrails? No  Home free of loose throw rugs in walkways, pet beds, electrical cords,  etc? Yes  Adequate lighting in your home to reduce risk of falls? Yes   ASSISTIVE DEVICES UTILIZED TO PREVENT FALLS:  Life alert? No  Use of a cane, walker or w/c? No  Grab bars in the bathroom? Yes  Shower chair or bench in shower? Yes  Elevated toilet seat or a handicapped toilet?  Comfort height  TIMED UP AND GO:  Was the test performed?  No, audio visit .   Cognitive Function:    05/17/2018   10:50 AM 04/21/2017    1:19 PM 04/11/2016    2:18 PM  MMSE - Mini Mental State Exam  Orientation to time 5 5 5   Orientation to Place 5 5 5   Registration 3 3 3   Attention/ Calculation 4 5 5   Recall 3 2 3   Language- name 2 objects 2 2 2   Language- repeat 1 1 1   Language- follow 3 step command 3 3 3   Language- read & follow direction 1 1 1   Write a sentence 1 1 1   Copy design 1 1 1   Total score 29 29 30         08/18/2022    2:35 PM 08/16/2021   10:22 AM  6CIT Screen  What Year? 0 points 0 points  What month? 0 points 0 points  What time? 0 points 0 points  Count back from 20 0 points 0 points  Months in reverse 0 points 0 points  Repeat phrase 0 points 0 points  Total Score 0 points 0 points    Immunizations Immunization History  Administered Date(s) Administered   Fluad Quad(high Dose 65+) 12/31/2018, 01/23/2020, 01/26/2021, 02/14/2022   Influenza Inj Mdck Quad Pf 01/13/2018  Influenza Split 02/09/2011   Influenza Whole 01/09/2008, 03/11/2009, 03/09/2010, 02/03/2012   Influenza,inj,Quad PF,6+ Mos 01/15/2014, 01/09/2015   Influenza-Unspecified 01/29/2016   PFIZER Comirnaty(Gray Top)Covid-19 Tri-Sucrose Vaccine 08/06/2020   PFIZER(Purple Top)SARS-COV-2 Vaccination 04/27/2019, 05/18/2019, 01/29/2020   Pneumococcal Conjugate-13 10/21/2014   Pneumococcal Polysaccharide-23 10/17/2013   Td 02/11/2008   Tdap 02/14/2022   Zoster Recombinat (Shingrix) 01/13/2018, 04/20/2018   Zoster, Live 05/18/2009    TDAP status: Up to date  Flu Vaccine status: Up to  date  Pneumococcal vaccine status: Up to date  Covid-19 vaccine status: Information provided on how to obtain vaccines.   Qualifies for Shingles Vaccine? Yes   Zostavax completed Yes   Shingrix Completed?: Yes  Screening Tests Health Maintenance  Topic Date Due   COVID-19 Vaccine (5 - 2023-24 season) 12/03/2021   Medicare Annual Wellness (AWV)  08/17/2022   INFLUENZA VACCINE  11/03/2022   MAMMOGRAM  07/31/2023   COLONOSCOPY (Pts 45-81yrs Insurance coverage will need to be confirmed)  12/27/2023   DTaP/Tdap/Td (3 - Td or Tdap) 02/15/2032   Pneumonia Vaccine 50+ Years old  Completed   DEXA SCAN  Completed   Hepatitis C Screening  Completed   Zoster Vaccines- Shingrix  Completed   HPV VACCINES  Aged Out    Health Maintenance  Health Maintenance Due  Topic Date Due   COVID-19 Vaccine (5 - 2023-24 season) 12/03/2021   Medicare Annual Wellness (AWV)  08/17/2022    Colorectal cancer screening: Type of screening: Colonoscopy. Completed 12/26/13. Repeat every 10 years  Mammogram status: Completed 07/30/21. Repeat every year  Bone Density status: Completed 07/30/21. Results reflect: Bone density results: OSTEOPENIA. Repeat every 2 years.  Lung Cancer Screening: (Low Dose CT Chest recommended if Age 14-80 years, 30 pack-year currently smoking OR have quit w/in 15years.) does not qualify.   Additional Screening:  Hepatitis C Screening: does qualify; Completed 08/06/20  Vision Screening: Recommended annual ophthalmology exams for early detection of glaucoma and other disorders of the eye. Is the patient up to date with their annual eye exam?  No  Who is the provider or what is the name of the office in which the patient attends annual eye exams? Doesn't have one at this time If pt is not established with a provider, would they like to be referred to a provider to establish care? No .   Dental Screening: Recommended annual dental exams for proper oral hygiene  Community Resource  Referral / Chronic Care Management: CRR required this visit?  No   CCM required this visit?  No      Plan:     I have personally reviewed and noted the following in the patient's chart:   Medical and social history Use of alcohol, tobacco or illicit drugs  Current medications and supplements including opioid prescriptions. Patient is not currently taking opioid prescriptions. Functional ability and status Nutritional status Physical activity Advanced directives List of other physicians Hospitalizations, surgeries, and ER visits in previous 12 months Vitals Screenings to include cognitive, depression, and falls Referrals and appointments  In addition, I have reviewed and discussed with patient certain preventive protocols, quality metrics, and best practice recommendations. A written personalized care plan for preventive services as well as general preventive health recommendations were provided to patient.   Due to this being a telephonic visit, the after visit summary with patients personalized plan was offered to patient via mail or my-chart.  Patient would like to access on my-chart.  Donne Anon, [facility]   08/18/2022  Nurse Notes: None

## 2022-08-18 NOTE — Patient Instructions (Signed)
Mindy Anderson , Thank you for taking time to come for your Medicare Wellness Visit. I appreciate your ongoing commitment to your health goals. Please review the following plan we discussed and let me know if I can assist you in the future.     This is a list of the screening recommended for you and due dates:  Health Maintenance  Topic Date Due   COVID-19 Vaccine (5 - 2023-24 season) 12/03/2021   Flu Shot  11/03/2022   Mammogram  07/31/2023   Medicare Annual Wellness Visit  08/18/2023   Colon Cancer Screening  12/27/2023   DTaP/Tdap/Td vaccine (3 - Td or Tdap) 02/15/2032   Pneumonia Vaccine  Completed   DEXA scan (bone density measurement)  Completed   Hepatitis C Screening: USPSTF Recommendation to screen - Ages 66-79 yo.  Completed   Zoster (Shingles) Vaccine  Completed   HPV Vaccine  Aged Out    Next appointment: Follow up in one year for your annual wellness visit.   Preventive Care 39 Years and Older, Female Preventive care refers to lifestyle choices and visits with your health care provider that can promote health and wellness. What does preventive care include? A yearly physical exam. This is also called an annual well check. Dental exams once or twice a year. Routine eye exams. Ask your health care provider how often you should have your eyes checked. Personal lifestyle choices, including: Daily care of your teeth and gums. Regular physical activity. Eating a healthy diet. Avoiding tobacco and drug use. Limiting alcohol use. Practicing safe sex. Taking low-dose aspirin every day. Taking vitamin and mineral supplements as recommended by your health care provider. What happens during an annual well check? The services and screenings done by your health care provider during your annual well check will depend on your age, overall health, lifestyle risk factors, and family history of disease. Counseling  Your health care provider may ask you questions about your: Alcohol  use. Tobacco use. Drug use. Emotional well-being. Home and relationship well-being. Sexual activity. Eating habits. History of falls. Memory and ability to understand (cognition). Work and work Astronomer. Reproductive health. Screening  You may have the following tests or measurements: Height, weight, and BMI. Blood pressure. Lipid and cholesterol levels. These may be checked every 5 years, or more frequently if you are over 52 years old. Skin check. Lung cancer screening. You may have this screening every year starting at age 60 if you have a 30-pack-year history of smoking and currently smoke or have quit within the past 15 years. Fecal occult blood test (FOBT) of the stool. You may have this test every year starting at age 52. Flexible sigmoidoscopy or colonoscopy. You may have a sigmoidoscopy every 5 years or a colonoscopy every 10 years starting at age 48. Hepatitis C blood test. Hepatitis B blood test. Sexually transmitted disease (STD) testing. Diabetes screening. This is done by checking your blood sugar (glucose) after you have not eaten for a while (fasting). You may have this done every 1-3 years. Bone density scan. This is done to screen for osteoporosis. You may have this done starting at age 13. Mammogram. This may be done every 1-2 years. Talk to your health care provider about how often you should have regular mammograms. Talk with your health care provider about your test results, treatment options, and if necessary, the need for more tests. Vaccines  Your health care provider may recommend certain vaccines, such as: Influenza vaccine. This is recommended every year.  Tetanus, diphtheria, and acellular pertussis (Tdap, Td) vaccine. You Juanita need a Td booster every 10 years. Zoster vaccine. You Taina need this after age 103. Pneumococcal 13-valent conjugate (PCV13) vaccine. One dose is recommended after age 41. Pneumococcal polysaccharide (PPSV23) vaccine. One dose is  recommended after age 43. Talk to your health care provider about which screenings and vaccines you need and how often you need them. This information is not intended to replace advice given to you by your health care provider. Make sure you discuss any questions you have with your health care provider. Document Released: 04/17/2015 Document Revised: 12/09/2015 Document Reviewed: 01/20/2015 Elsevier Interactive Patient Education  2017 [place] Prevention in the Home Falls can cause injuries. They can happen to people of all ages. There are many things you can do to make your home safe and to help prevent falls. What can I do on the outside of my home? Regularly fix the edges of walkways and driveways and fix any cracks. Remove anything that might make you trip as you walk through a door, such as a raised step or threshold. Trim any bushes or trees on the path to your home. Use bright outdoor lighting. Clear any walking paths of anything that might make someone trip, such as rocks or tools. Regularly check to see if handrails are loose or broken. Make sure that both sides of any steps have handrails. Any raised decks and porches should have guardrails on the edges. Have any leaves, snow, or ice cleared regularly. Use sand or salt on walking paths during winter. Clean up any spills in your garage right away. This includes oil or grease spills. What can I do in the bathroom? Use night lights. Install grab bars by the toilet and in the tub and shower. Do not use towel bars as grab bars. Use non-skid mats or decals in the tub or shower. If you need to sit down in the shower, use a plastic, non-slip stool. Keep the floor dry. Clean up any water that spills on the floor as soon as it happens. Remove soap buildup in the tub or shower regularly. Attach bath mats securely with double-sided non-slip rug tape. Do not have throw rugs and other things on the floor that can make you  trip. What can I do in the bedroom? Use night lights. Make sure that you have a light by your bed that is easy to reach. Do not use any sheets or blankets that are too big for your bed. They should not hang down onto the floor. Have a firm chair that has side arms. You can use this for support while you get dressed. Do not have throw rugs and other things on the floor that can make you trip. What can I do in the kitchen? Clean up any spills right away. Avoid walking on wet floors. Keep items that you use a lot in easy-to-reach places. If you need to reach something above you, use a strong step stool that has a grab bar. Keep electrical cords out of the way. Do not use floor polish or wax that makes floors slippery. If you must use wax, use non-skid floor wax. Do not have throw rugs and other things on the floor that can make you trip. What can I do with my stairs? Do not leave any items on the stairs. Make sure that there are handrails on both sides of the stairs and use them. Fix handrails that are broken or loose.  Make sure that handrails are as long as the stairways. Check any carpeting to make sure that it is firmly attached to the stairs. Fix any carpet that is loose or worn. Avoid having throw rugs at the top or bottom of the stairs. If you do have throw rugs, attach them to the floor with carpet tape. Make sure that you have a light switch at the top of the stairs and the bottom of the stairs. If you do not have them, ask someone to add them for you. What else can I do to help prevent falls? Wear shoes that: Do not have high heels. Have rubber bottoms. Are comfortable and fit you well. Are closed at the toe. Do not wear sandals. If you use a stepladder: Make sure that it is fully opened. Do not climb a closed stepladder. Make sure that both sides of the stepladder are locked into place. Ask someone to hold it for you, if possible. Clearly mark and make sure that you can  see: Any grab bars or handrails. First and last steps. Where the edge of each step is. Use tools that help you move around (mobility aids) if they are needed. These include: Canes. Walkers. Scooters. Crutches. Turn on the lights when you go into a dark area. Replace any light bulbs as soon as they burn out. Set up your furniture so you have a clear path. Avoid moving your furniture around. If any of your floors are uneven, fix them. If there are any pets around you, be aware of where they are. Review your medicines with your doctor. Some medicines can make you feel dizzy. This can increase your chance of falling. Ask your doctor what other things that you can do to help prevent falls. This information is not intended to replace advice given to you by your health care provider. Make sure you discuss any questions you have with your health care provider. Document Released: 01/15/2009 Document Revised: 08/27/2015 Document Reviewed: 04/25/2014 Elsevier Interactive Patient Education  2017 [employer].

## 2022-08-22 ENCOUNTER — Other Ambulatory Visit: Payer: Self-pay

## 2022-08-22 ENCOUNTER — Ambulatory Visit: Payer: Medicare HMO | Admitting: Family Medicine

## 2022-08-22 ENCOUNTER — Ambulatory Visit (INDEPENDENT_AMBULATORY_CARE_PROVIDER_SITE_OTHER): Payer: Medicare HMO

## 2022-08-22 VITALS — BP 152/80 | HR 86 | Ht 64.0 in | Wt 211.0 lb

## 2022-08-22 DIAGNOSIS — M25562 Pain in left knee: Secondary | ICD-10-CM

## 2022-08-22 DIAGNOSIS — M1712 Unilateral primary osteoarthritis, left knee: Secondary | ICD-10-CM | POA: Diagnosis not present

## 2022-08-22 DIAGNOSIS — M25561 Pain in right knee: Secondary | ICD-10-CM

## 2022-08-22 DIAGNOSIS — G8929 Other chronic pain: Secondary | ICD-10-CM

## 2022-08-22 DIAGNOSIS — M1711 Unilateral primary osteoarthritis, right knee: Secondary | ICD-10-CM | POA: Diagnosis not present

## 2022-08-22 NOTE — Patient Instructions (Addendum)
Thank you for coming in today.   Please get an Xray today before you leave   You received an injection today. Seek immediate medical attention if the joint becomes red, extremely painful, or is oozing fluid.   Please use Voltaren gel (Generic Diclofenac Gel) up to 4x daily for pain as needed.  This is available over-the-counter as both the name brand Voltaren gel and the generic diclofenac gel.   I can repeat these shots every 3 months if needed  Let me know how it goes  Check back as needed

## 2022-08-22 NOTE — Progress Notes (Signed)
Rubin Payor, PhD, LAT, ATC acting as a scribe for Mindy Graham, MD.  Mindy Anderson is a 72 y.o. female who presents to [facility] today for bilateral knee pain ongoing for several years. Pt locates pain to the anterior-medial aspect of her proximal lower leg. She notes the feeling of her knees giving out on her.   Knee swelling: yes Mechanical symptoms: yes Aggravates: increased activity, walking Treatments tried: knee braces, cream, modify activity, IBU  Dx imaging: 08/06/20 R & L knee XR  Pertinent review of systems: No fevers or chills  Relevant historical information: Hypertension   Exam:  BP (!) 152/80   Pulse 86   Ht 5\' 4"  (1.626 m)   Wt 211 lb (95.7 kg)   SpO2 96%   BMI 36.22 kg/m  General: Well Developed, well nourished, and in no acute distress.   MSK: Right knee: Mild effusion and swelling at medial joint line.  Otherwise normal-appearing Normal motion with crepitation. Tender palpation medial joint line. Stable ligamentous exam.  Left knee: Mild effusion and swelling at medial joint line.  Normal-appearing otherwise. Normal motion with crepitation. Tender palpation medial joint line. Stable ligamentous exam.    Lab and Radiology Results  Procedure: Real-time Ultrasound Guided Injection of right knee joint superior lateral patellar space Device: Philips Affiniti 50G Images permanently stored and available for review in PACS Verbal informed consent obtained.  Discussed risks and benefits of procedure. Warned about infection, bleeding, hyperglycemia damage to structures among others. Patient expresses understanding and agreement Time-out conducted.   Noted no overlying erythema, induration, or other signs of local infection.   Skin prepped in a sterile fashion.   Local anesthesia: Topical Ethyl chloride.   With sterile technique and under real time ultrasound guidance: 40 mg of Kenalog and 2 mL of Marcaine injected  into knee joint. Fluid seen entering the joint capsule.   Completed without difficulty   Pain immediately resolved suggesting accurate placement of the medication.   Advised to call if fevers/chills, erythema, induration, drainage, or persistent bleeding.   Images permanently stored and available for review in the ultrasound unit.  Impression: Technically successful ultrasound guided injection.    Procedure: Real-time Ultrasound Guided Injection of left knee joint superior lateral patellar space Device: Philips Affiniti 50G Images permanently stored and available for review in PACS Verbal informed consent obtained.  Discussed risks and benefits of procedure. Warned about infection, bleeding, hyperglycemia damage to structures among others. Patient expresses understanding and agreement Time-out conducted.   Noted no overlying erythema, induration, or other signs of local infection.   Skin prepped in a sterile fashion.   Local anesthesia: Topical Ethyl chloride.   With sterile technique and under real time ultrasound guidance: 40 mg of Kenalog and 2 mL Marcaine injected into knee joint. Fluid seen entering the joint capsule.   Completed without difficulty   Pain immediately resolved suggesting accurate placement of the medication.   Advised to call if fevers/chills, erythema, induration, drainage, or persistent bleeding.   Images permanently stored and available for review in the ultrasound unit.  Impression: Technically successful ultrasound guided injection.    X-ray images bilateral knees obtained today personally and independently interpreted  Right knee: Severe medial compartment DJD.  Mild patellofemoral DJD.  No acute fractures are visible.  Left knee: Moderate to severe medial compartment DJD.  Mild patellofemoral DJD.  No acute fractures.  Await formal radiology review     Assessment and Plan: 72  y.o. female with bilateral knee pain thought to be due to exacerbation of  DJD.  Plan for steroid injection bilateral knees today.  We discussed options.  She is reluctant to consider physical therapy.  Recommend Voltaren gel and Tylenol.  Recommend avoiding high-dose oral NSAIDs with elevated blood pressure. Check back as needed.  I can perform the same injection every 3 months if needed.  If it is not in the last 3 months she will let me know and we can work on authorization for hyaluronic acid injections or Zilretta injections.  PDMP not reviewed this encounter. Orders Placed This Encounter  Procedures   [place] LIMITED JOINT SPACE STRUCTURES LOW BILAT(NO LINKED CHARGES)    Order Specific Question:   Reason for Exam (SYMPTOM  OR DIAGNOSIS REQUIRED)    Answer:   bilateral knee pain    Order Specific Question:   Preferred imaging location?    Answer:   [facility] Knee AP/LAT W/Sunrise Left    Standing Status:   Future    Number of Occurrences:   1    Standing Expiration Date:   09/22/2022    Order Specific Question:   Reason for Exam (SYMPTOM  OR DIAGNOSIS REQUIRED)    Answer:   bilateral knee pain    Order Specific Question:   Preferred imaging location?    Answer:   [facility] Knee AP/LAT W/Sunrise Right    Standing Status:   Future    Number of Occurrences:   1    Standing Expiration Date:   09/22/2022    Order Specific Question:   Reason for Exam (SYMPTOM  OR DIAGNOSIS REQUIRED)    Answer:   bilateral knee pain    Order Specific Question:   Preferred imaging location?    Answer:   [facility]   No orders of the defined types were placed in this encounter.    Discussed warning signs or symptoms. Please see discharge instructions. Patient expresses understanding.   The above documentation has been reviewed and is accurate and complete Mindy Anderson, M.D.

## 2022-08-23 NOTE — Progress Notes (Signed)
Left knee x-ray shows severe arthritis in the joint.

## 2022-08-23 NOTE — Progress Notes (Signed)
Right knee x-ray shows severe arthritis in the knee joint.

## 2022-11-17 ENCOUNTER — Other Ambulatory Visit: Payer: Self-pay | Admitting: Family Medicine

## 2022-11-17 DIAGNOSIS — I10 Essential (primary) hypertension: Secondary | ICD-10-CM

## 2022-11-28 ENCOUNTER — Telehealth: Payer: Self-pay | Admitting: Family Medicine

## 2022-11-28 NOTE — Telephone Encounter (Signed)
noted 

## 2022-11-28 NOTE — Telephone Encounter (Signed)
Pt called & stated she has been bleeding in her vagina for several days & is experiencing back pain as well. She stated she hasn't been to UC so I transferred her to speak with triage.

## 2022-11-29 ENCOUNTER — Ambulatory Visit (INDEPENDENT_AMBULATORY_CARE_PROVIDER_SITE_OTHER): Payer: Medicare HMO | Admitting: Family Medicine

## 2022-11-29 ENCOUNTER — Encounter: Payer: Self-pay | Admitting: Family Medicine

## 2022-11-29 VITALS — BP 144/60 | HR 78 | Temp 98.0°F | Resp 18 | Ht 64.0 in | Wt 209.0 lb

## 2022-11-29 DIAGNOSIS — Z8542 Personal history of malignant neoplasm of other parts of uterus: Secondary | ICD-10-CM

## 2022-11-29 DIAGNOSIS — N939 Abnormal uterine and vaginal bleeding, unspecified: Secondary | ICD-10-CM | POA: Diagnosis not present

## 2022-11-29 DIAGNOSIS — R82998 Other abnormal findings in urine: Secondary | ICD-10-CM | POA: Diagnosis not present

## 2022-11-29 DIAGNOSIS — Z8541 Personal history of malignant neoplasm of cervix uteri: Secondary | ICD-10-CM | POA: Diagnosis not present

## 2022-11-29 LAB — POCT URINALYSIS DIPSTICK
Blood, UA: NEGATIVE
Glucose, UA: NEGATIVE
Ketones, UA: NEGATIVE
Nitrite, UA: NEGATIVE
Protein, UA: NEGATIVE
Spec Grav, UA: 1.02 (ref 1.010–1.025)
Urobilinogen, UA: 0.2 E.U./dL
pH, UA: 5 (ref 5.0–8.0)

## 2022-11-29 NOTE — Progress Notes (Signed)
Established Patient Office Visit  Subjective   Patient ID: Mindy Anderson, female    DOB: 1951/01/03  Age: 72 y.o. MRN: [id number]  Chief Complaint  Patient presents with   Vaginal Bleeding    HPI Discussed the use of AI scribe software for clinical note transcription with the patient, who gave verbal consent to proceed.  History of Present Illness   The patient, with a history of uterine and cervical cancer in their mother, presents with vaginal bleeding that began on Saturday morning. The blood was dark and initially only noticed on toilet paper after urination. The bleeding increased slightly on Sunday, necessitating the use of panty liners. The patient denies any associated pain or cramping. She has not had any bleeding since Sunday. The patient denies any urinary symptoms such as burning or frequency.      Patient Active Problem List   Diagnosis Date Noted   Vaginal bleeding 11/29/2022   Primary osteoarthritis of both knees 08/15/2022   Balance problem 08/15/2022   Chronic pain of right ankle 08/16/2021   Chest pain 08/16/2021   COVID-19 08/20/2020   Sore throat 08/20/2020   Bronchitis 08/20/2020   Chronic pain of both knees 08/06/2020   Right hip pain 01/23/2020   Low back pain with radiation 01/23/2020   Diarrhea 12/31/2018   Right knee pain 07/05/2017   Preventative health care 04/11/2016   Vaginitis and vulvovaginitis 11/03/2014   UNSPECIFIED VITAMIN D DEFICIENCY 05/13/2009   ACTINIC KERATOSIS 02/11/2008   POSTMENOPAUSAL STATUS 02/11/2008   OSTEOARTHRITIS, KNEES, BILATERAL 09/27/2006   Primary hypertension 09/22/2006   Past Medical History:  Diagnosis Date   Arthritis    hands, knees    Complication of anesthesia    Family history of adverse reaction to anesthesia    mother of pt. wakes slowly   Hypertension    PONV (postoperative nausea and vomiting)    Past Surgical History:  Procedure Laterality Date   Band Aid surgery  1980's   CHOLECYSTECTOMY N/A  06/25/2014   Procedure: LAPAROSCOPIC CHOLECYSTECTOMY;  Surgeon: Axel Filler, MD;  Location: [facility] OR;  Service: General;  Laterality: N/A;   TUBAL LIGATION     Social History   Tobacco Use   Smoking status: Never   Smokeless tobacco: Never  Substance Use Topics   Alcohol use: No    Alcohol/week: 0.0 standard drinks of alcohol   Drug use: No   Social History   Socioeconomic History   Marital status: Married    Spouse name: Not on file   Number of children: Not on file   Years of education: Not on file   Highest education level: Not on file  Occupational History   Occupation: retired  Tobacco Use   Smoking status: Never   Smokeless tobacco: Never  Substance and Sexual Activity   Alcohol use: No    Alcohol/week: 0.0 standard drinks of alcohol   Drug use: No   Sexual activity: Never    Partners: Male  Other Topics Concern   Not on file  Social History Narrative   Exercise-- no   Social Determinants of Health   Financial Resource Strain: Low Risk  (08/16/2021)   Overall Financial Resource Strain (CARDIA)    Difficulty of Paying Living Expenses: Not hard at all  Food Insecurity: No Food Insecurity (08/18/2022)   Hunger Vital Sign    Worried About Running Out of Food in the Last Year: Never true    Ran Out of Food in the Last  Year: Never true  Transportation Needs: No Transportation Needs (08/18/2022)   PRAPARE - Administrator, Civil Service (Medical): No    Lack of Transportation (Non-Medical): No  Physical Activity: Inactive (08/16/2021)   Exercise Vital Sign    Days of Exercise per Week: 0 days    Minutes of Exercise per Session: 0 min  Stress: No Stress Concern Present (08/16/2021)   [employer] of Occupational Health - Occupational Stress Questionnaire    Feeling of Stress : Not at all  Social Connections: Moderately Integrated (08/16/2021)   Social Connection and Isolation Panel [NHANES]    Frequency of Communication with Friends and Family: More  than three times a week    Frequency of Social Gatherings with Friends and Family: More than three times a week    Attends Religious Services: More than 4 times per year    Active Member of [employer] or Organizations: No    Attends Banker Meetings: Never    Marital Status: Married  Catering manager Violence: Not At Risk (08/18/2022)   Humiliation, Afraid, Rape, and Kick questionnaire    Fear of Current or Ex-Partner: No    Emotionally Abused: No    Physically Abused: No    Sexually Abused: No   Family Status  Relation Name Status   Mother  Deceased   Father  Deceased at age 22       copd   Brother Peyton Najjar Alive   Brother Marketing executive Alive   Sister  Deceased   Daughter  (Not Specified)   Neg Hx  (Not Specified)  No partnership data on file   Family History  Problem Relation Age of Onset   Uterine cancer Mother    Heart disease Mother 33       chf   Breast cancer Mother    Cervical cancer Mother    Cancer Mother        skin   COPD Father    Heart disease Father 17       MI   Heart disease Brother        cabg with stents   Cancer Brother        Skin cancer   Heart disease Brother    Heart attack Brother 50   Lung cancer Sister    Liver cancer Sister    Breast cancer Sister    Colon cancer Sister    Colon polyps Daughter    Rectal cancer Neg Hx    Stomach cancer Neg Hx    Esophageal cancer Neg Hx    Allergies  Allergen Reactions   Hydrocodone Itching and Nausea And Vomiting   Tramadol Nausea Only      Review of Systems  Constitutional:  Negative for chills, fever and malaise/fatigue.  HENT:  Negative for congestion and hearing loss.   Eyes:  Negative for blurred vision and discharge.  Respiratory:  Negative for cough, sputum production and shortness of breath.   Cardiovascular:  Negative for chest pain, palpitations and leg swelling.  Gastrointestinal:  Negative for abdominal pain, blood in stool, constipation, diarrhea, heartburn, nausea and vomiting.   Genitourinary:  Negative for dysuria, frequency, hematuria and urgency.  Musculoskeletal:  Negative for back pain, falls and myalgias.  Skin:  Negative for rash.  Neurological:  Negative for dizziness, sensory change, loss of consciousness, weakness and headaches.  Endo/Heme/Allergies:  Negative for environmental allergies. Does not bruise/bleed easily.  Psychiatric/Behavioral:  Negative for depression and suicidal ideas. The patient is  not nervous/anxious and does not have insomnia.       Objective:     BP (!) 144/60   Pulse 78   Temp 98 F (36.7 C)   Resp 18   Ht 5\' 4"  (1.626 m)   Wt 209 lb (94.8 kg)   SpO2 98%   BMI 35.87 kg/m  BP Readings from Last 3 Encounters:  11/29/22 (!) 144/60  08/22/22 (!) 152/80  08/15/22 128/80   Wt Readings from Last 3 Encounters:  11/29/22 209 lb (94.8 kg)  08/22/22 211 lb (95.7 kg)  08/15/22 215 lb 9.6 oz (97.8 kg)   SpO2 Readings from Last 3 Encounters:  11/29/22 98%  08/22/22 96%  08/15/22 97%      Physical Exam Vitals and nursing note reviewed.  Constitutional:      General: She is not in acute distress.    Appearance: Normal appearance. She is well-developed.  HENT:     Head: Normocephalic and atraumatic.  Eyes:     General: No scleral icterus.       Right eye: No discharge.        Left eye: No discharge.  Cardiovascular:     Rate and Rhythm: Normal rate and regular rhythm.     Heart sounds: No murmur heard. Pulmonary:     Effort: Pulmonary effort is normal. No respiratory distress.     Breath sounds: Normal breath sounds.  Musculoskeletal:        General: Normal range of motion.     Cervical back: Normal range of motion and neck supple.     Right lower leg: No edema.     Left lower leg: No edema.  Skin:    General: Skin is warm and dry.  Neurological:     General: No focal deficit present.     Mental Status: She is alert and oriented to person, place, and time.  Psychiatric:        Mood and Affect: Mood normal.         Behavior: Behavior normal.        Thought Content: Thought content normal.        Judgment: Judgment normal.      Results for orders placed or performed in visit on 11/29/22  POCT Urinalysis Dipstick  Result Value Ref Range   Color, UA yellow    Clarity, UA clear    Glucose, UA Negative Negative   Bilirubin, UA small    Ketones, UA negative    Spec Grav, UA 1.020 1.010 - 1.025   Blood, UA negative    pH, UA 5.0 5.0 - 8.0   Protein, UA Negative Negative   Urobilinogen, UA 0.2 0.2 or 1.0 E.U./dL   Nitrite, UA negative    Leukocytes, UA Small (1+) (A) Negative   Appearance     Odor      Last CBC Lab Results  Component Value Date   WBC 7.7 08/15/2022   HGB 13.9 08/15/2022   HCT 41.5 08/15/2022   MCV 91.2 08/15/2022   MCH 29.3 04/21/2017   RDW 13.9 08/15/2022   PLT 221.0 08/15/2022   Last metabolic panel Lab Results  Component Value Date   GLUCOSE 115 (H) 08/15/2022   NA 142 08/15/2022   K 3.9 08/15/2022   CL 103 08/15/2022   CO2 29 08/15/2022   BUN 12 08/15/2022   CREATININE 0.70 08/15/2022   GFR 85.53 08/15/2022   CALCIUM 9.0 08/15/2022   PROT 7.1 08/15/2022   ALBUMIN  3.8 08/15/2022   BILITOT 0.6 08/15/2022   ALKPHOS 80 08/15/2022   AST 16 08/15/2022   ALT 13 08/15/2022   ANIONGAP 9 06/19/2014   Last lipids Lab Results  Component Value Date   CHOL 150 08/15/2022   HDL 46.00 08/15/2022   LDLCALC 89 08/15/2022   TRIG 76.0 08/15/2022   CHOLHDL 3 08/15/2022   Last hemoglobin A1c No results found for: "HGBA1C" Last thyroid functions Lab Results  Component Value Date   TSH 1.54 04/02/2012   Last vitamin D Lab Results  Component Value Date   VD25OH 33.34 08/15/2022   Last vitamin B12 and Folate No results found for: "VITAMINB12", "FOLATE"    The 10-year ASCVD risk score (Arnett DK, et al., 2019) is: 22.8%    Assessment & Plan:   Problem List Items Addressed This Visit       Unprioritized   Vaginal bleeding - Primary    Refer to  gyn US pelvis       Relevant Orders   Ambulatory referral to Obstetrics / Gynecology   US Pelvic Complete With Transvaginal   POCT Urinalysis Dipstick (Completed)   Urine Culture    No follow-ups on file.    Donato Schultz, DO

## 2022-11-29 NOTE — Assessment & Plan Note (Signed)
Refer to gyn US pelvis

## 2022-11-29 NOTE — Patient Instructions (Addendum)
Call Radiology [phone]    to schedule your ultrasound Postmenopausal Bleeding Postmenopausal bleeding is any bleeding that a woman has after she has entered menopause. Menopause is the end of a woman's fertile years. After menopause, a woman no longer ovulates and does not have menstrual periods. Therefore, she should no longer have bleeding from her vagina. Postmenopausal bleeding may have various causes, including: Menopausal hormone therapy (MHT). Endometrial atrophy. After menopause, low estrogen hormone levels cause the membrane that lines the uterus (endometrium) to become thin. You may have bleeding as the endometrium thins. Endometrial hyperplasia. This condition is caused by excess estrogen hormones and low levels of progesterone hormones. The excess estrogen causes the endometrium to thicken, which can lead to bleeding. In some cases, this can lead to cancer of the uterus. Endometrial cancer. Noncancerous growths (polyps) on the endometrium, the lining of the uterus, or the cervix. Uterine fibroids. These are noncancerous growths in or around the uterus muscle tissue that can cause heavy bleeding. Any type of postmenopausal bleeding, even if it appears to be a typical menstrual period, should be checked by your health care provider. Treatment will depend on the cause of the bleeding. Follow these instructions at home:  Pay attention to any changes in your symptoms. Let your health care provider know about them. Avoid using tampons and douches as told by your health care provider. Change your pads regularly. Get regular pelvic exams, including Pap tests, as told by your health care provider. Take iron supplements as told by your health care provider. Take over-the-counter and prescription medicines only as told by your health care provider. Keep all follow-up visits. This is important. Contact a health care provider if: You have new bleeding from the vagina after menopause. You  have pain in your abdomen. Get help right away if: You have a fever or chills. You have severe pain with bleeding. You are passing blood clots. You have heavy bleeding, need more than 1 pad an hour, and have never experienced this before. You have headaches or feel faint or dizzy. Summary Postmenopausal bleeding is any bleeding that a woman has after she has entered into menopause. Postmenopausal bleeding may have various causes. Treatment will depend on the cause of the bleeding. Any type of postmenopausal bleeding, even if it appears to be a typical menstrual period, should be checked by your health care provider. Be sure to pay attention to any changes in your symptoms and keep all follow-up visits. This information is not intended to replace advice given to you by your health care provider. Make sure you discuss any questions you have with your health care provider. Document Revised: 09/05/2019 Document Reviewed: 09/05/2019 Elsevier Patient Education  2024 [employer].

## 2022-11-29 NOTE — Telephone Encounter (Signed)
 Appt today w/ Dr. Etter Sjogren

## 2022-11-29 NOTE — Telephone Encounter (Signed)
Initial Comment She has vaginal bleeding since sat. nothing yet this morning. Translation No Nurse Assessment Nurse: Susette Racer, RN, Amy Date/Time Lamount Cohen Time): 11/28/2022 8:50:29 AM Confirm and document reason for call. If symptomatic, describe symptoms. ---Caller states she has vaginal bleeding since Saturday, states it is has been dark red. Does the patient have any new or worsening symptoms? ---Yes Will a triage be completed? ---Yes Related visit to physician within the last 2 weeks? ---No Does the PT have any chronic conditions? (i.e. diabetes, asthma, this includes High risk factors for pregnancy, etc.) ---Yes List chronic conditions. ---HTN Is this a behavioral health or substance abuse call? ---No Guidelines Guideline Title Affirmed Question Affirmed Notes Nurse Date/Time Lamount Cohen Time) Vaginal Bleeding - Postmenopausal Postmenopausal vaginal bleeding Susette Racer, RN, Amy 11/28/2022 8:51:40 AM Disp. Time Lamount Cohen Time) Disposition Final User 11/28/2022 8:55:49 AM SEE PCP WITHIN 3 DAYS Yes Susette Racer, RN, Amy Final Disposition 11/28/2022 8:55:49 AM SEE PCP WITHIN 3 DAYS Yes Susette Racer, RN, Amy Disposition Overriden: See PCP within 2 Weeks PLEASE NOTE: All timestamps contained within this report are represented as [place] Standard Time. CONFIDENTIALTY NOTICE: This fax transmission is intended only for the addressee. It contains information that is legally privileged, confidential or otherwise protected from use or disclosure. If you are not the intended recipient, you are strictly prohibited from reviewing, disclosing, copying using or disseminating any of this information or taking any action in reliance on or regarding this information. If you have received this fax in error, please notify us immediately by telephone so that we can arrange for its return to [place]. Phone: [phone], Toll-Free: [phone], Fax: [phone] Page: 2 of 2 Call Id: [id number] Override Reason: Specify reason.  (Please document in 'advice recommended' section) Caller Disagree/Comply Comply Caller Understands Yes PreDisposition Call Doctor Care Advice Given Per Guideline * Do not take any aspirin products. Reason: Worsens the bleeding tendency. SEE PCP WITHIN 3 DAYS: * You need to be seen within 2 or 3 days. * PCP VISIT: Call your doctor (or NP/PA) during regular office hours and make an appointment. A clinic or urgent care center are good places to go for care if your doctor's office is closed or you can't get an appointment. NOTE: If office will be open tomorrow, tell caller to call then, not in 3 days. CALL BACK IF: * You become worse CARE ADVICE given per Vaginal Bleeding, Postmenopausal (Adult) guideline. Comments User: Roswell Nickel, RN Date/Time Lamount Cohen Time): 11/28/2022 8:54:16 AM upgrade due to family hx of uterine cancer (patient's mother) and her sx was abnormal vaginal bleeding Referrals REFERRED TO PCP OFFICE

## 2022-11-30 ENCOUNTER — Ambulatory Visit (HOSPITAL_BASED_OUTPATIENT_CLINIC_OR_DEPARTMENT_OTHER)
Admission: RE | Admit: 2022-11-30 | Discharge: 2022-11-30 | Disposition: A | Discharge status: Home / Self Care | Payer: Medicare HMO | Source: Ambulatory Visit | Attending: Family Medicine | Admitting: Family Medicine

## 2022-11-30 DIAGNOSIS — N939 Abnormal uterine and vaginal bleeding, unspecified: Secondary | ICD-10-CM | POA: Diagnosis not present

## 2022-11-30 DIAGNOSIS — Z780 Asymptomatic menopausal state: Secondary | ICD-10-CM | POA: Diagnosis not present

## 2022-11-30 DIAGNOSIS — R9389 Abnormal findings on diagnostic imaging of other specified body structures: Secondary | ICD-10-CM | POA: Diagnosis not present

## 2022-11-30 DIAGNOSIS — N858 Other specified noninflammatory disorders of uterus: Secondary | ICD-10-CM | POA: Diagnosis not present

## 2022-11-30 LAB — URINE CULTURE
MICRO NUMBER:: 15387681
SPECIMEN QUALITY:: ADEQUATE

## 2022-12-08 ENCOUNTER — Other Ambulatory Visit: Payer: Self-pay | Admitting: Family Medicine

## 2022-12-08 DIAGNOSIS — N950 Postmenopausal bleeding: Secondary | ICD-10-CM

## 2022-12-09 ENCOUNTER — Telehealth: Payer: Self-pay | Admitting: Family Medicine

## 2022-12-09 NOTE — Telephone Encounter (Signed)
Spoke with patient. Pt verbalized understanding  °

## 2022-12-09 NOTE — Telephone Encounter (Signed)
Does she need to do anything other wait for the GYN appt?

## 2022-12-09 NOTE — Telephone Encounter (Signed)
Pt saw lab results/referral to GYN. Pt has questions about next steps or if she needs to do anything. Please call her to discuss further

## 2022-12-15 ENCOUNTER — Encounter: Payer: Self-pay | Admitting: Family Medicine

## 2022-12-15 DIAGNOSIS — Z0000 Encounter for general adult medical examination without abnormal findings: Secondary | ICD-10-CM

## 2022-12-20 ENCOUNTER — Encounter: Payer: Self-pay | Admitting: Medical

## 2022-12-20 ENCOUNTER — Ambulatory Visit (INDEPENDENT_AMBULATORY_CARE_PROVIDER_SITE_OTHER): Payer: Medicare HMO | Admitting: Medical

## 2022-12-20 VITALS — BP 130/70 | HR 70 | Temp 98.0°F | Resp 18 | Ht 64.0 in | Wt 207.2 lb

## 2022-12-20 DIAGNOSIS — N63 Unspecified lump in unspecified breast: Secondary | ICD-10-CM

## 2022-12-20 NOTE — Patient Instructions (Addendum)
You have a small pea-sized lump right upper breast area.  On review you are overdue for your yearly mammogram.  Will need to go ahead and placed diagnostic mammogram today.  This area is small and superficial.  No evidence of skin infection presently.  If area changes or worsens let me know.  Placing diagnostic mammogram order today.  If by the end of the week you are not contacted or not scheduled please let me know.

## 2022-12-20 NOTE — Progress Notes (Signed)
Subjective:    Patient ID: Mindy Anderson, female    DOB: [birth year], 72 y.o.   MRN: [id number]  HPI  Pt has small bump just under surface of skin rt upper breast region. Pt states one week ago she felt like had bug under her shirt. Pt states no pain just noticed. She marked the area with red marker.  Pt states her mom and sister had breast cancer.   Pt last mammogram wa 07-30-2021. Result was negative.       Review of Systems  Constitutional:  Negative for chills, fatigue, fever and unexpected weight change.  Respiratory:  Negative for chest tightness, shortness of breath and wheezing.   Cardiovascular:  Negative for chest pain and palpitations.  Gastrointestinal:  Negative for abdominal pain and constipation.  Genitourinary:  Negative for dyspareunia and dysuria.  Musculoskeletal:  Negative for back pain.  Neurological:  Negative for tremors, seizures, facial asymmetry, weakness and light-headedness.  Hematological:  Negative for adenopathy. Does not bruise/bleed easily.  Psychiatric/Behavioral:  Negative for behavioral problems and decreased concentration.    Past Medical History:  Diagnosis Date   Arthritis    hands, knees    Complication of anesthesia    Family history of adverse reaction to anesthesia    mother of pt. wakes slowly   Hypertension    PONV (postoperative nausea and vomiting)      Social History   Socioeconomic History   Marital status: Married    Spouse name: Not on file   Number of children: Not on file   Years of education: Not on file   Highest education level: Not on file  Occupational History   Occupation: retired  Tobacco Use   Smoking status: Never   Smokeless tobacco: Never  Substance and Sexual Activity   Alcohol use: No    Alcohol/week: 0.0 standard drinks of alcohol   Drug use: No   Sexual activity: Never    Partners: Male  Other Topics Concern   Not on file  Social History Narrative   Exercise-- no   Social Determinants  of Health   Financial Resource Strain: Low Risk  (08/16/2021)   Overall Financial Resource Strain (CARDIA)    Difficulty of Paying Living Expenses: Not hard at all  Food Insecurity: No Food Insecurity (08/18/2022)   Hunger Vital Sign    Worried About Running Out of Food in the Last Year: Never true    Ran Out of Food in the Last Year: Never true  Transportation Needs: No Transportation Needs (08/18/2022)   PRAPARE - Administrator, Civil Service (Medical): No    Lack of Transportation (Non-Medical): No  Physical Activity: Inactive (08/16/2021)   Exercise Vital Sign    Days of Exercise per Week: 0 days    Minutes of Exercise per Session: 0 min  Stress: No Stress Concern Present (08/16/2021)   [employer] of Occupational Health - Occupational Stress Questionnaire    Feeling of Stress : Not at all  Social Connections: Moderately Integrated (08/16/2021)   Social Connection and Isolation Panel [NHANES]    Frequency of Communication with Friends and Family: More than three times a week    Frequency of Social Gatherings with Friends and Family: More than three times a week    Attends Religious Services: More than 4 times per year    Active Member of [employer] or Organizations: No    Attends Banker Meetings: Never    Marital Status: Married  Intimate Partner Violence: Not At Risk (08/18/2022)   Humiliation, Afraid, Rape, and Kick questionnaire    Fear of Current or Ex-Partner: No    Emotionally Abused: No    Physically Abused: No    Sexually Abused: No    Past Surgical History:  Procedure Laterality Date   Band Aid surgery  1980's   CHOLECYSTECTOMY N/A 06/25/2014   Procedure: LAPAROSCOPIC CHOLECYSTECTOMY;  Surgeon: Axel Filler, MD;  Location: [facility] OR;  Service: General;  Laterality: N/A;   TUBAL LIGATION      Family History  Problem Relation Age of Onset   Uterine cancer Mother    Heart disease Mother 49       chf   Breast cancer Mother    Cervical  cancer Mother    Cancer Mother        skin   COPD Father    Heart disease Father 85       MI   Heart disease Brother        cabg with stents   Cancer Brother        Skin cancer   Heart disease Brother    Heart attack Brother 82   Lung cancer Sister    Liver cancer Sister    Breast cancer Sister    Colon cancer Sister    Colon polyps Daughter    Rectal cancer Neg Hx    Stomach cancer Neg Hx    Esophageal cancer Neg Hx     Allergies  Allergen Reactions   Hydrocodone Itching and Nausea And Vomiting   Tramadol Nausea Only    Current Outpatient Medications on File Prior to Visit  Medication Sig Dispense Refill   amLODipine (NORVASC) 5 MG tablet TAKE 1 TABLET EVERY DAY 90 tablet 1   aspirin 81 MG tablet Take 81 mg by mouth daily.     cholecalciferol (VITAMIN D3) 25 MCG (1000 UNIT) tablet Take 1,000 Units by mouth daily.     diclofenac Sodium (VOLTAREN) 1 % GEL Apply 4 g topically 4 (four) times daily. 150 g 2   Fluticasone Propionate (FLONASE NA) Place into the nose.     lisinopril-hydrochlorothiazide (ZESTORETIC) 10-12.5 MG tablet TAKE 1 TABLET EVERY DAY 90 tablet 1   meloxicam (MOBIC) 7.5 MG tablet TAKE 1 TO 2 TABLETS EVERY DAY AS NEEDED 60 tablet 0   No current facility-administered medications on file prior to visit.    BP 130/70 (BP Location: Left Arm, Patient Position: Sitting, Cuff Size: Large)   Pulse 70   Temp 98 F (36.7 C) (Oral)   Resp 18   Ht 5\' 4"  (1.626 m)   Wt 207 lb 3.2 oz (94 kg)   SpO2 95%   BMI 35.57 kg/m        Objective:   Physical Exam  General Mental Status- Alert. General Appearance- Not in acute distress.    Chest and Lung Exam Auscultation: Breath Sounds:-Normal.  Cardiovascular Auscultation:Rythm- Regular. Murmurs & Other Heart Sounds:Auscultation of the heart reveals- No Murmurs.     Neurologic Cranial Nerve exam:- CN III-XII intact(No nystagmus), symmetric smile. Strength:- 5/5 equal and symmetric strength both upper  and lower extremities.   Bilateral breast symmetric. No discoloration. On rt upper breast area small pea sized lump under red mark. No other lumps felt. No dc from nipple. No lymphadenopathy I axillary area. Left breast- no massed or lumps. Axillary areas- no lymph nodes.    Assessment & Plan:   Patient Instructions  You have a  small pea-sized lump right upper breast area.  On review you are overdue for your yearly mammogram.  Will need to go ahead and placed diagnostic mammogram today.  This area is small and superficial.  No evidence of skin infection presently.  If area changes or worsens let me know.  Placing diagnostic mammogram order today.  If by the end of the week you are not contacted or not scheduled please let me know.   Esperanza Richters, PA-C

## 2022-12-22 ENCOUNTER — Other Ambulatory Visit: Payer: Self-pay | Admitting: Medical

## 2022-12-22 DIAGNOSIS — N63 Unspecified lump in unspecified breast: Secondary | ICD-10-CM

## 2022-12-29 ENCOUNTER — Ambulatory Visit
Admission: RE | Admit: 2022-12-29 | Discharge: 2022-12-29 | Disposition: A | Discharge status: Home / Self Care | Payer: Medicare HMO | Source: Ambulatory Visit | Attending: Medical | Admitting: Medical

## 2022-12-29 ENCOUNTER — Other Ambulatory Visit: Payer: Self-pay | Admitting: Medical

## 2022-12-29 DIAGNOSIS — N6310 Unspecified lump in the right breast, unspecified quadrant: Secondary | ICD-10-CM

## 2022-12-29 DIAGNOSIS — N63 Unspecified lump in unspecified breast: Secondary | ICD-10-CM

## 2022-12-29 DIAGNOSIS — N6312 Unspecified lump in the right breast, upper inner quadrant: Secondary | ICD-10-CM | POA: Diagnosis not present

## 2023-01-05 ENCOUNTER — Ambulatory Visit
Admission: RE | Admit: 2023-01-05 | Discharge: 2023-01-05 | Disposition: A | Discharge status: Home / Self Care | Payer: Medicare HMO | Source: Ambulatory Visit | Attending: Medical | Admitting: Medical

## 2023-01-05 DIAGNOSIS — N6312 Unspecified lump in the right breast, upper inner quadrant: Secondary | ICD-10-CM | POA: Diagnosis not present

## 2023-01-05 DIAGNOSIS — N61 Mastitis without abscess: Secondary | ICD-10-CM | POA: Diagnosis not present

## 2023-01-05 DIAGNOSIS — N63 Unspecified lump in unspecified breast: Secondary | ICD-10-CM

## 2023-01-05 DIAGNOSIS — N6310 Unspecified lump in the right breast, unspecified quadrant: Secondary | ICD-10-CM

## 2023-01-05 DIAGNOSIS — N641 Fat necrosis of breast: Secondary | ICD-10-CM | POA: Diagnosis not present

## 2023-01-05 HISTORY — PX: BREAST BIOPSY: SHX20

## 2023-01-06 LAB — SURGICAL PATHOLOGY

## 2023-01-23 NOTE — Progress Notes (Unsigned)
Rubin Payor, PhD, LAT, ATC acting as a scribe for [name], MD.  Mindy Anderson is a 73 y.o. female who presents to [facility] at [facility] today for cont'd bilat knee pain Pt was last seen by Dr. Denyse Amass on 08/22/22 and was given bilat knee steroid injection.  Today, pt reports about a month and a half after she was seen in May, she tripped and caught herself abruptly exacerbating her knee pain. She has been putting off returning over the last couple months.   Dx imaging: 08/22/22 R & L knee XR 08/06/20 R & L knee XR   Pertinent review of systems: No fevers or chills  Relevant historical information: Hypertension.  Knee arthritis.   Exam:  BP 138/78   Pulse 80   Ht 5\' 4"  (1.626 m)   Wt 208 lb (94.3 kg)   SpO2 98%   BMI 35.70 kg/m  General: Well Developed, well nourished, and in no acute distress.   MSK: Bilateral knees mild effusion normal motion with crepitation.    Lab and Radiology Results  Procedure: Real-time Ultrasound Guided Injection of right knee joint superior lateral patella space Device: Philips Affiniti 50G/GE Logiq Images permanently stored and available for review in PACS Verbal informed consent obtained.  Discussed risks and benefits of procedure. Warned about infection, bleeding, hyperglycemia damage to structures among others. Patient expresses understanding and agreement Time-out conducted.   Noted no overlying erythema, induration, or other signs of local infection.   Skin prepped in a sterile fashion.   Local anesthesia: Topical Ethyl chloride.   With sterile technique and under real time ultrasound guidance: 40 mg of Kenalog and 2 mL of Marcaine injected into knee joint. Fluid seen entering the joint capsule.   Completed without difficulty   Pain immediately resolved suggesting accurate placement of the medication.   Advised to call if fevers/chills, erythema, induration, drainage, or persistent bleeding.   Images  permanently stored and available for review in the ultrasound unit.  Impression: Technically successful ultrasound guided injection.   Procedure: Real-time Ultrasound Guided Injection of left knee joint superior lateral patella space Device: Philips Affiniti 50G/GE Logiq Images permanently stored and available for review in PACS Verbal informed consent obtained.  Discussed risks and benefits of procedure. Warned about infection, bleeding, hyperglycemia damage to structures among others. Patient expresses understanding and agreement Time-out conducted.   Noted no overlying erythema, induration, or other signs of local infection.   Skin prepped in a sterile fashion.   Local anesthesia: Topical Ethyl chloride.   With sterile technique and under real time ultrasound guidance: 40 mg of Kenalog and 2 mL of Marcaine injected into knee joint. Fluid seen entering the joint capsule.   Completed without difficulty   Pain immediately resolved suggesting accurate placement of the medication.   Advised to call if fevers/chills, erythema, induration, drainage, or persistent bleeding.   Images permanently stored and available for review in the ultrasound unit.  Impression: Technically successful ultrasound guided injection.        Assessment and Plan: 72 y.o. female with bilateral knee pain due to DJD.  DJD seen on prior x-ray.  Plan for steroid injection today.  Can repeat this injection every 3 months.  If this shot does not work well enough or last long enough we could authorize hyaluronic acid injections or Zilretta injections.  Check back as needed.  Continue quad strengthening exercises.   PDMP not reviewed this encounter. Orders Placed This Encounter  Procedures   [place] LIMITED JOINT SPACE STRUCTURES LOW BILAT(NO LINKED CHARGES)    Order Specific Question:   Reason for Exam (SYMPTOM  OR DIAGNOSIS REQUIRED)    Answer:   bilateral knee pain    Order Specific Question:   Preferred imaging  location?    Answer:   [facility]   No orders of the defined types were placed in this encounter.    Discussed warning signs or symptoms. Please see discharge instructions. Patient expresses understanding.   The above documentation has been reviewed and is accurate and complete [name], M.D.

## 2023-01-24 ENCOUNTER — Other Ambulatory Visit: Payer: Self-pay

## 2023-01-24 ENCOUNTER — Ambulatory Visit: Payer: Medicare HMO | Admitting: Family Medicine

## 2023-01-24 VITALS — BP 138/78 | HR 80 | Ht 64.0 in | Wt 208.0 lb

## 2023-01-24 DIAGNOSIS — M170 Bilateral primary osteoarthritis of knee: Secondary | ICD-10-CM

## 2023-01-24 DIAGNOSIS — M25561 Pain in right knee: Secondary | ICD-10-CM

## 2023-01-24 DIAGNOSIS — M25562 Pain in left knee: Secondary | ICD-10-CM | POA: Diagnosis not present

## 2023-01-24 DIAGNOSIS — G8929 Other chronic pain: Secondary | ICD-10-CM | POA: Diagnosis not present

## 2023-01-24 NOTE — Patient Instructions (Addendum)
Thank you for coming in today.   You received an injection today. Seek immediate medical attention if the joint becomes red, extremely painful, or is oozing fluid.   Please use Voltaren gel (Generic Diclofenac Gel) up to 4x daily for pain as needed.  This is available over-the-counter as both the name brand Voltaren gel and the generic diclofenac gel.   If this shot doesn't last at least 3 months, let us know so we can get the gel shots authorized.   Check back as needed

## 2023-02-21 ENCOUNTER — Encounter: Payer: Medicare HMO | Admitting: Family Medicine

## 2023-02-24 ENCOUNTER — Encounter: Payer: Self-pay | Admitting: Family Medicine

## 2023-02-24 ENCOUNTER — Ambulatory Visit (INDEPENDENT_AMBULATORY_CARE_PROVIDER_SITE_OTHER): Payer: Medicare HMO | Admitting: Family Medicine

## 2023-02-24 VITALS — BP 130/80 | HR 80 | Temp 98.3°F | Resp 18 | Ht 64.0 in | Wt 205.2 lb

## 2023-02-24 DIAGNOSIS — I10 Essential (primary) hypertension: Secondary | ICD-10-CM

## 2023-02-24 DIAGNOSIS — E559 Vitamin D deficiency, unspecified: Secondary | ICD-10-CM | POA: Diagnosis not present

## 2023-02-24 DIAGNOSIS — Z0000 Encounter for general adult medical examination without abnormal findings: Secondary | ICD-10-CM

## 2023-02-24 LAB — CBC WITH DIFFERENTIAL/PLATELET
Basophils Absolute: 0 10*3/uL (ref 0.0–0.1)
Basophils Relative: 0.6 % (ref 0.0–3.0)
Eosinophils Absolute: 0.2 10*3/uL (ref 0.0–0.7)
Eosinophils Relative: 2.7 % (ref 0.0–5.0)
HCT: 44.3 % (ref 36.0–46.0)
Hemoglobin: 14.6 g/dL (ref 12.0–15.0)
Lymphocytes Relative: 20.1 % (ref 12.0–46.0)
Lymphs Abs: 1.6 10*3/uL (ref 0.7–4.0)
MCHC: 33 g/dL (ref 30.0–36.0)
MCV: 93.2 fL (ref 78.0–100.0)
Monocytes Absolute: 0.8 10*3/uL (ref 0.1–1.0)
Monocytes Relative: 10.7 % (ref 3.0–12.0)
Neutro Abs: 5.1 10*3/uL (ref 1.4–7.7)
Neutrophils Relative %: 65.9 % (ref 43.0–77.0)
Platelets: 237 10*3/uL (ref 150.0–400.0)
RBC: 4.75 Mil/uL (ref 3.87–5.11)
RDW: 13.8 % (ref 11.5–15.5)
WBC: 7.8 10*3/uL (ref 4.0–10.5)

## 2023-02-24 LAB — LIPID PANEL
Cholesterol: 177 mg/dL (ref 0–200)
HDL: 49.5 mg/dL (ref >39.00)
LDL Cholesterol: 114 mg/dL — ABNORMAL HIGH (ref 0–99)
NonHDL: 127.79
Total CHOL/HDL Ratio: 4
Triglycerides: 70 mg/dL (ref 0.0–149.0)
VLDL: 14 mg/dL (ref 0.0–40.0)

## 2023-02-24 LAB — TSH: TSH: 2.4 u[IU]/mL (ref 0.35–5.50)

## 2023-02-24 LAB — COMPREHENSIVE METABOLIC PANEL
ALT: 15 U/L (ref 0–35)
AST: 17 U/L (ref 0–37)
Albumin: 4 g/dL (ref 3.5–5.2)
Alkaline Phosphatase: 82 U/L (ref 39–117)
BUN: 12 mg/dL (ref 6–23)
CO2: 33 meq/L — ABNORMAL HIGH (ref 19–32)
Calcium: 9.4 mg/dL (ref 8.4–10.5)
Chloride: 102 meq/L (ref 96–112)
Creatinine, Ser: 0.73 mg/dL (ref 0.40–1.20)
GFR: 81.03 mL/min (ref >60.00)
Glucose, Bld: 110 mg/dL — ABNORMAL HIGH (ref 70–99)
Potassium: 4.3 meq/L (ref 3.5–5.1)
Sodium: 142 meq/L (ref 135–145)
Total Bilirubin: 0.5 mg/dL (ref 0.2–1.2)
Total Protein: 7.1 g/dL (ref 6.0–8.3)

## 2023-02-24 LAB — VITAMIN D 25 HYDROXY (VIT D DEFICIENCY, FRACTURES): VITD: 31.93 ng/mL (ref 30.00–100.00)

## 2023-02-24 NOTE — Assessment & Plan Note (Signed)
Well controlled, no changes to meds. Encouraged heart healthy diet such as the DASH diet and exercise as tolerated.  °

## 2023-02-24 NOTE — Assessment & Plan Note (Signed)
Ghm utd Check labs Health Maintenance  Topic Date Due   COVID-19 Vaccine (5 - 2023-24 season) 03/12/2023 (Originally 12/04/2022)   Medicare Annual Wellness (AWV)  08/18/2023   Colonoscopy  12/27/2023   MAMMOGRAM  12/28/2024   DTaP/Tdap/Td (3 - Td or Tdap) 02/15/2032   Pneumonia Vaccine 54+ Years old  Completed   INFLUENZA VACCINE  Completed   DEXA SCAN  Completed   Hepatitis C Screening  Completed   Zoster Vaccines- Shingrix  Completed   HPV VACCINES  Aged Out

## 2023-02-24 NOTE — Progress Notes (Signed)
Established Patient Office Visit  Subjective   Patient ID: Mindy Anderson, female    DOB: 05-01-1950  Age: 72 y.o. MRN: [id number]  Chief Complaint  Patient presents with   Annual Exam    Pt states fasting     HPI Discussed the use of AI scribe software for clinical note transcription with the patient, who gave verbal consent to proceed.  History of Present Illness   The patient, with a history of hypertension and allergies, reports no new health issues since the last visit. She is currently on Meloxicam, Lisinopril, and Amlodipine, and reports no issues with these medications. The patient's allergies have been well-managed this year.  The patient had a recent gynecological concern involving abnormal uterine bleeding. She underwent an ultrasound which revealed a thickened endometrial lining and fluid in the uterus. The patient reports that the bleeding has since resolved.  The patient also had a mammogram and biopsy for a lump in the breast, which was determined to be benign. She believes the lump may be related to a previous fall.  The patient has also been experiencing knee pain, for which she received steroid injections. The relief from these injections was temporary and not entirely satisfactory.  The patient is up-to-date with immunizations, including the flu shot.  The patient is due for a visit to the eye doctor, but has been keeping up with dental appointments, including a recent deep cleaning.      Patient Active Problem List   Diagnosis Date Noted   Vaginal bleeding 11/29/2022   Primary osteoarthritis of both knees 08/15/2022   Balance problem 08/15/2022   Chronic pain of right ankle 08/16/2021   Chest pain 08/16/2021   COVID-19 08/20/2020   Bronchitis 08/20/2020   Chronic pain of both knees 08/06/2020   Right hip pain 01/23/2020   Low back pain with radiation 01/23/2020   Diarrhea 12/31/2018   Preventative health care 04/11/2016   Vaginitis and vulvovaginitis  11/03/2014   UNSPECIFIED VITAMIN D DEFICIENCY 05/13/2009   ACTINIC KERATOSIS 02/11/2008   POSTMENOPAUSAL STATUS 02/11/2008   Primary hypertension 09/22/2006   Past Medical History:  Diagnosis Date   Arthritis    hands, knees    Complication of anesthesia    Family history of adverse reaction to anesthesia    mother of pt. wakes slowly   Hypertension    PONV (postoperative nausea and vomiting)    Past Surgical History:  Procedure Laterality Date   Band Aid surgery  1980's   BREAST BIOPSY Right 01/05/2023   [place] RT BREAST BX W LOC DEV 1ST LESION IMG BX SPEC US GUIDE 01/05/2023 [facility] MAMMOGRAPHY   CHOLECYSTECTOMY N/A 06/25/2014   Procedure: LAPAROSCOPIC CHOLECYSTECTOMY;  Surgeon: Axel Filler, MD;  Location: [facility] OR;  Service: General;  Laterality: N/A;   TUBAL LIGATION     Social History   Tobacco Use   Smoking status: Never   Smokeless tobacco: Never  Substance Use Topics   Alcohol use: No    Alcohol/week: 0.0 standard drinks of alcohol   Drug use: No   Social History   Socioeconomic History   Marital status: Married    Spouse name: Not on file   Number of children: Not on file   Years of education: Not on file   Highest education level: Not on file  Occupational History   Occupation: retired  Tobacco Use   Smoking status: Never   Smokeless tobacco: Never  Substance and Sexual Activity   Alcohol use: No  Alcohol/week: 0.0 standard drinks of alcohol   Drug use: No   Sexual activity: Never    Partners: Male  Other Topics Concern   Not on file  Social History Narrative   Exercise-- no   Social Determinants of Health   Financial Resource Strain: Low Risk  (08/16/2021)   Overall Financial Resource Strain (CARDIA)    Difficulty of Paying Living Expenses: Not hard at all  Food Insecurity: No Food Insecurity (08/18/2022)   Hunger Vital Sign    Worried About Running Out of Food in the Last Year: Never true    Ran Out of Food in the Last Year: Never true   Transportation Needs: No Transportation Needs (08/18/2022)   PRAPARE - Administrator, Civil Service (Medical): No    Lack of Transportation (Non-Medical): No  Physical Activity: Inactive (08/16/2021)   Exercise Vital Sign    Days of Exercise per Week: 0 days    Minutes of Exercise per Session: 0 min  Stress: No Stress Concern Present (08/16/2021)   [employer] of Occupational Health - Occupational Stress Questionnaire    Feeling of Stress : Not at all  Social Connections: Moderately Integrated (08/16/2021)   Social Connection and Isolation Panel [NHANES]    Frequency of Communication with Friends and Family: More than three times a week    Frequency of Social Gatherings with Friends and Family: More than three times a week    Attends Religious Services: More than 4 times per year    Active Member of [employer] or Organizations: No    Attends Banker Meetings: Never    Marital Status: Married  Catering manager Violence: Not At Risk (08/18/2022)   Humiliation, Afraid, Rape, and Kick questionnaire    Fear of Current or Ex-Partner: No    Emotionally Abused: No    Physically Abused: No    Sexually Abused: No   Family Status  Relation Name Status   Mother  Deceased   Father  Deceased at age 77       copd   Brother Peyton Najjar Alive   Brother Marketing executive Alive   Sister  Deceased   Daughter  (Not Specified)   Neg Hx  (Not Specified)  No partnership data on file   Family History  Problem Relation Age of Onset   Uterine cancer Mother    Heart disease Mother 23       chf   Breast cancer Mother    Cervical cancer Mother    Cancer Mother        skin   COPD Father    Heart disease Father 67       MI   Heart disease Brother        cabg with stents   Cancer Brother        Skin cancer   Heart disease Brother    Heart attack Brother 16   Lung cancer Sister    Liver cancer Sister    Breast cancer Sister    Colon cancer Sister    Colon polyps Daughter    Rectal  cancer Neg Hx    Stomach cancer Neg Hx    Esophageal cancer Neg Hx    Allergies  Allergen Reactions   Hydrocodone Itching and Nausea And Vomiting   Tramadol Nausea Only      Review of Systems  Constitutional:  Negative for fever and malaise/fatigue.  HENT:  Negative for congestion.   Eyes:  Negative for blurred vision.  Respiratory:  Negative for cough and shortness of breath.   Cardiovascular:  Negative for chest pain, palpitations and leg swelling.  Gastrointestinal:  Negative for abdominal pain, blood in stool, nausea and vomiting.  Genitourinary:  Negative for dysuria and frequency.  Musculoskeletal:  Negative for back pain and falls.  Skin:  Negative for rash.  Neurological:  Negative for dizziness, loss of consciousness and headaches.  Endo/Heme/Allergies:  Negative for environmental allergies.  Psychiatric/Behavioral:  Negative for depression. The patient is not nervous/anxious.       Objective:     BP 130/80 (BP Location: Left Arm, Patient Position: Sitting, Cuff Size: Large)   Pulse 80   Temp 98.3 F (36.8 C) (Oral)   Resp 18   Ht 5\' 4"  (1.626 m)   Wt 205 lb 3.2 oz (93.1 kg)   SpO2 97%   BMI 35.22 kg/m  BP Readings from Last 3 Encounters:  02/24/23 130/80  01/24/23 138/78  12/20/22 130/70   Wt Readings from Last 3 Encounters:  02/24/23 205 lb 3.2 oz (93.1 kg)  01/24/23 208 lb (94.3 kg)  12/20/22 207 lb 3.2 oz (94 kg)   SpO2 Readings from Last 3 Encounters:  02/24/23 97%  01/24/23 98%  12/20/22 95%      Physical Exam Vitals and nursing note reviewed.  Constitutional:      General: She is not in acute distress.    Appearance: Normal appearance. She is well-developed.  HENT:     Head: Normocephalic and atraumatic.     Right Ear: Tympanic membrane, ear canal and external ear normal. There is no impacted cerumen.     Left Ear: Tympanic membrane, ear canal and external ear normal. There is no impacted cerumen.     Nose: Nose normal.      Mouth/Throat:     Mouth: Mucous membranes are moist.     Pharynx: Oropharynx is clear. No oropharyngeal exudate or posterior oropharyngeal erythema.  Eyes:     General: No scleral icterus.       Right eye: No discharge.        Left eye: No discharge.     Conjunctiva/sclera: Conjunctivae normal.     Pupils: Pupils are equal, round, and reactive to light.  Neck:     Thyroid: No thyromegaly or thyroid tenderness.     Vascular: No JVD.  Cardiovascular:     Rate and Rhythm: Normal rate and regular rhythm.     Heart sounds: Normal heart sounds. No murmur heard. Pulmonary:     Effort: Pulmonary effort is normal. No respiratory distress.     Breath sounds: Normal breath sounds.  Abdominal:     General: Bowel sounds are normal. There is no distension.     Palpations: Abdomen is soft. There is no mass.     Tenderness: There is no abdominal tenderness. There is no guarding or rebound.  Genitourinary:    Vagina: Normal.  Musculoskeletal:        General: Normal range of motion.     Cervical back: Normal range of motion and neck supple.     Right lower leg: No edema.     Left lower leg: No edema.  Lymphadenopathy:     Cervical: No cervical adenopathy.  Skin:    General: Skin is warm and dry.     Findings: No erythema or rash.  Neurological:     Mental Status: She is alert and oriented to person, place, and time.     Cranial Nerves:  No cranial nerve deficit.     Deep Tendon Reflexes: Reflexes are normal and symmetric.  Psychiatric:        Mood and Affect: Mood normal.        Behavior: Behavior normal.        Thought Content: Thought content normal.        Judgment: Judgment normal.      No results found for any visits on 02/24/23.  Last CBC Lab Results  Component Value Date   WBC 7.7 08/15/2022   HGB 13.9 08/15/2022   HCT 41.5 08/15/2022   MCV 91.2 08/15/2022   MCH 29.3 04/21/2017   RDW 13.9 08/15/2022   PLT 221.0 08/15/2022   Last metabolic panel Lab Results  Component  Value Date   GLUCOSE 115 (H) 08/15/2022   NA 142 08/15/2022   K 3.9 08/15/2022   CL 103 08/15/2022   CO2 29 08/15/2022   BUN 12 08/15/2022   CREATININE 0.70 08/15/2022   GFR 85.53 08/15/2022   CALCIUM 9.0 08/15/2022   PROT 7.1 08/15/2022   ALBUMIN 3.8 08/15/2022   BILITOT 0.6 08/15/2022   ALKPHOS 80 08/15/2022   AST 16 08/15/2022   ALT 13 08/15/2022   ANIONGAP 9 06/19/2014   Last lipids Lab Results  Component Value Date   CHOL 150 08/15/2022   HDL 46.00 08/15/2022   LDLCALC 89 08/15/2022   TRIG 76.0 08/15/2022   CHOLHDL 3 08/15/2022   Last hemoglobin A1c No results found for: "HGBA1C" Last thyroid functions Lab Results  Component Value Date   TSH 1.54 04/02/2012   Last vitamin D Lab Results  Component Value Date   VD25OH 33.34 08/15/2022   Last vitamin B12 and Folate No results found for: "VITAMINB12", "FOLATE"    The 10-year ASCVD risk score (Arnett DK, et al., 2019) is: 19%    Assessment & Plan:   Problem List Items Addressed This Visit       Unprioritized   UNSPECIFIED VITAMIN D DEFICIENCY - Primary   Relevant Orders   VITAMIN D 25 Hydroxy (Vit-D Deficiency, Fractures)   Primary hypertension    Well controlled, no changes to meds. Encouraged heart healthy diet such as the DASH diet and exercise as tolerated.        Relevant Orders   CBC with Differential/Platelet   Comprehensive metabolic panel   Lipid panel   TSH   Preventative health care    Ghm utd Check labs Health Maintenance  Topic Date Due   COVID-19 Vaccine (5 - 2023-24 season) 03/12/2023 (Originally 12/04/2022)   Medicare Annual Wellness (AWV)  08/18/2023   Colonoscopy  12/27/2023   MAMMOGRAM  12/28/2024   DTaP/Tdap/Td (3 - Td or Tdap) 02/15/2032   Pneumonia Vaccine 8+ Years old  Completed   INFLUENZA VACCINE  Completed   DEXA SCAN  Completed   Hepatitis C Screening  Completed   Zoster Vaccines- Shingrix  Completed   HPV VACCINES  Aged Out          No follow-ups on file.     Donato Schultz, DO

## 2023-05-12 ENCOUNTER — Other Ambulatory Visit: Payer: Self-pay | Admitting: Family Medicine

## 2023-05-12 DIAGNOSIS — I10 Essential (primary) hypertension: Secondary | ICD-10-CM

## 2023-05-30 ENCOUNTER — Telehealth: Payer: Self-pay | Admitting: Family Medicine

## 2023-05-30 ENCOUNTER — Telehealth: Payer: Self-pay

## 2023-05-30 NOTE — Telephone Encounter (Signed)
 Ran for orthovisc waiting a response

## 2023-05-30 NOTE — Telephone Encounter (Signed)
 Please schedule patient when we have stock. Thank you   ORTHOVISC authorized for bilateral knee Deductible does not apply Once the OOP has been met is covered at 100% Only one copayper DOS Copay is $15 PA not required  OOP max $6750 has met $0

## 2023-05-30 NOTE — Telephone Encounter (Signed)
 Approved.  See other phone note.

## 2023-05-30 NOTE — Telephone Encounter (Signed)
 Patient called asking if we could go ahead and start the process of getting authorization for gel injections?  Please advise.

## 2023-06-02 NOTE — Telephone Encounter (Signed)
 Patient scheduled for 06/09/23 10:15

## 2023-06-09 ENCOUNTER — Other Ambulatory Visit: Payer: Self-pay

## 2023-06-09 ENCOUNTER — Ambulatory Visit: Payer: Medicare HMO | Admitting: Family Medicine

## 2023-06-09 VITALS — BP 130/80 | HR 80 | Ht 64.0 in | Wt 212.0 lb

## 2023-06-09 DIAGNOSIS — G8929 Other chronic pain: Secondary | ICD-10-CM

## 2023-06-09 DIAGNOSIS — M25561 Pain in right knee: Secondary | ICD-10-CM | POA: Diagnosis not present

## 2023-06-09 DIAGNOSIS — M170 Bilateral primary osteoarthritis of knee: Secondary | ICD-10-CM

## 2023-06-09 DIAGNOSIS — M25562 Pain in left knee: Secondary | ICD-10-CM

## 2023-06-09 MED ORDER — HYALURONAN 30 MG/2ML IX SOSY
30.0000 mg | PREFILLED_SYRINGE | Freq: Once | INTRA_ARTICULAR | Status: AC
Start: 2023-06-09 — End: 2023-06-09
  Administered 2023-06-09: 30 mg via INTRA_ARTICULAR

## 2023-06-09 NOTE — Patient Instructions (Signed)
 Thank you for coming in today.   You received an injection today. Seek immediate medical attention if the joint becomes red, extremely painful, or is oozing fluid.   Schedule the 2nd shot for next week, and the 3rd shot for the week after that

## 2023-06-09 NOTE — Progress Notes (Signed)
 Mindy Payor, PhD, LAT, ATC acting as a scribe for Mindy Graham, MD.  Mindy Anderson is a 73 y.o. female who presents to [facility] at [facility] today for cont'd bilat knee pain. Pt was last seen by Dr. Denyse Anderson on 01/24/23 and was given bilat knee steroid injections and was advised to cont working on quad strengthening.  Today, pt reports bilat knee pain really worsened in January. She is wanting to start the Orthovisc injections today.   Dx imaging: 08/22/22 R & L knee XR 08/06/20 R & L knee XR  Pertinent review of systems: No fevers or chills  Relevant historical information: Pretension   Exam:  BP 130/80   Pulse 80   Ht 5\' 4"  (1.626 m)   Wt 212 lb (96.2 kg)   SpO2 97%   BMI 36.39 kg/m  General: Well Developed, well nourished, and in no acute distress.   MSK: Knees bilaterally minimal effusion normal motion.    Lab and Radiology Results  Mindy Anderson ZOXWRUE presents to clinic today for Orthovisc injection bilateral knee 1/3 Procedure: Real-time Ultrasound Guided Injection of right knee joint superior lateral patellar space Device: Philips Affiniti 50G/GE Logiq Images permanently stored and available for review in PACS Verbal informed consent obtained.  Discussed risks and benefits of procedure. Warned about infection, bleeding, damage to structures among others. Patient expresses understanding and agreement Time-out conducted.   Noted no overlying erythema, induration, or other signs of local infection.   Skin prepped in a sterile fashion.   Local anesthesia: Topical Ethyl chloride.   With sterile technique and under real time ultrasound guidance: Orthovisc 30 mg injected into knee joint. Fluid seen entering the joint capsule.   Completed without difficulty   Advised to call if fevers/chills, erythema, induration, drainage, or persistent bleeding.   Images permanently stored and available for review in the ultrasound unit.  Impression: Technically  successful ultrasound guided injection.  Procedure: Real-time Ultrasound Guided Injection of left knee joint superior lateral patellar space Device: Philips Affiniti 50G/GE Logiq Images permanently stored and available for review in PACS Verbal informed consent obtained.  Discussed risks and benefits of procedure. Warned about infection, bleeding, damage to structures among others. Patient expresses understanding and agreement Time-out conducted.   Noted no overlying erythema, induration, or other signs of local infection.   Skin prepped in a sterile fashion.   Local anesthesia: Topical Ethyl chloride.   With sterile technique and under real time ultrasound guidance: Orthovisc 30 mg injected into knee joint. Fluid seen entering the joint capsule.   Completed without difficulty   Advised to call if fevers/chills, erythema, induration, drainage, or persistent bleeding.   Images permanently stored and available for review in the ultrasound unit.  Impression: Technically successful ultrasound guided injection. Lot number: [id number] for both injections     Assessment and Plan: 73 y.o. female with chronic bilateral knee pain due to exacerbation of DJD.  Plan for Orthovisc injection series starting today for both knees.  Return in 1 week for bilateral Orthovisc injection 2/3.   PDMP not reviewed this encounter. Orders Placed This Encounter  Procedures   [place] LIMITED JOINT SPACE STRUCTURES LOW BILAT(NO LINKED CHARGES)    Reason for Exam (SYMPTOM  OR DIAGNOSIS REQUIRED):   bilateral knee pain    Preferred imaging location?:   [facility]   Meds ordered this encounter  Medications   Hyaluronan (ORTHOVISC) intra-articular injection 30 mg   Hyaluronan (ORTHOVISC) intra-articular injection 30  mg     Discussed warning signs or symptoms. Please see discharge instructions. Patient expresses understanding.   The above documentation has been reviewed and is accurate and  complete Mindy Anderson, M.D.

## 2023-06-10 IMAGING — MG MM DIGITAL SCREENING BILAT W/ TOMO AND CAD
6 of 10 series · 6 of 30 positions shown · non-contrast
Comparison: Previous exam(s).

CLINICAL DATA: Screening.

EXAM:
DIGITAL SCREENING BILATERAL MAMMOGRAM WITH TOMOSYNTHESIS AND CAD
TECHNIQUE: Bilateral screening digital craniocaudal and mediolateral oblique
mammograms were obtained. Bilateral screening digital breast
tomosynthesis was performed. The images were evaluated with
computer-aided detection.

[L MLO synth-2D]
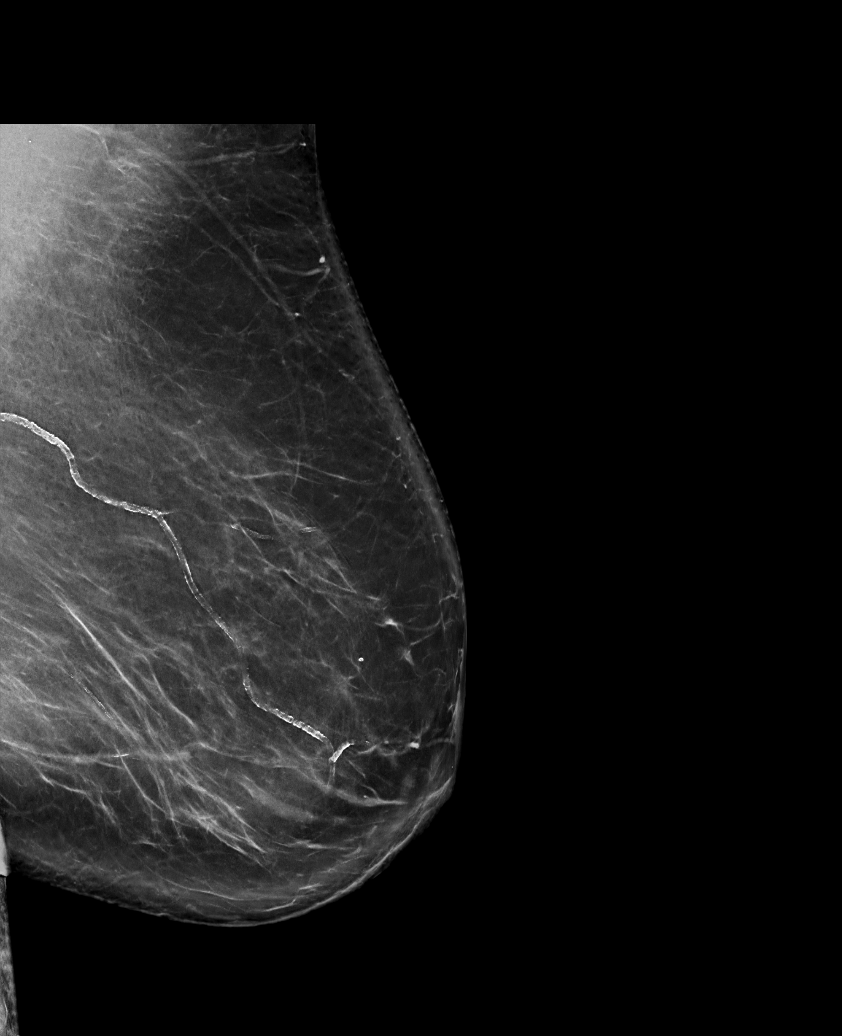

[R MLO synth-2D (1 of 2)]
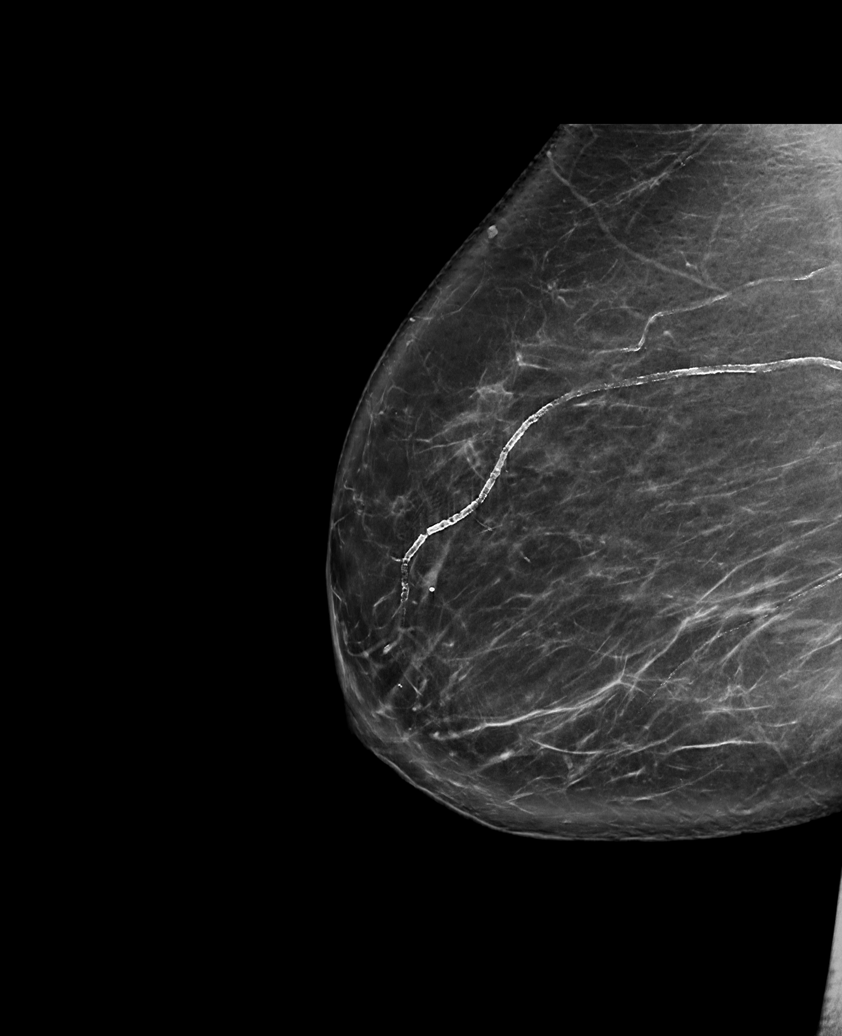

[R CC synth-2D]
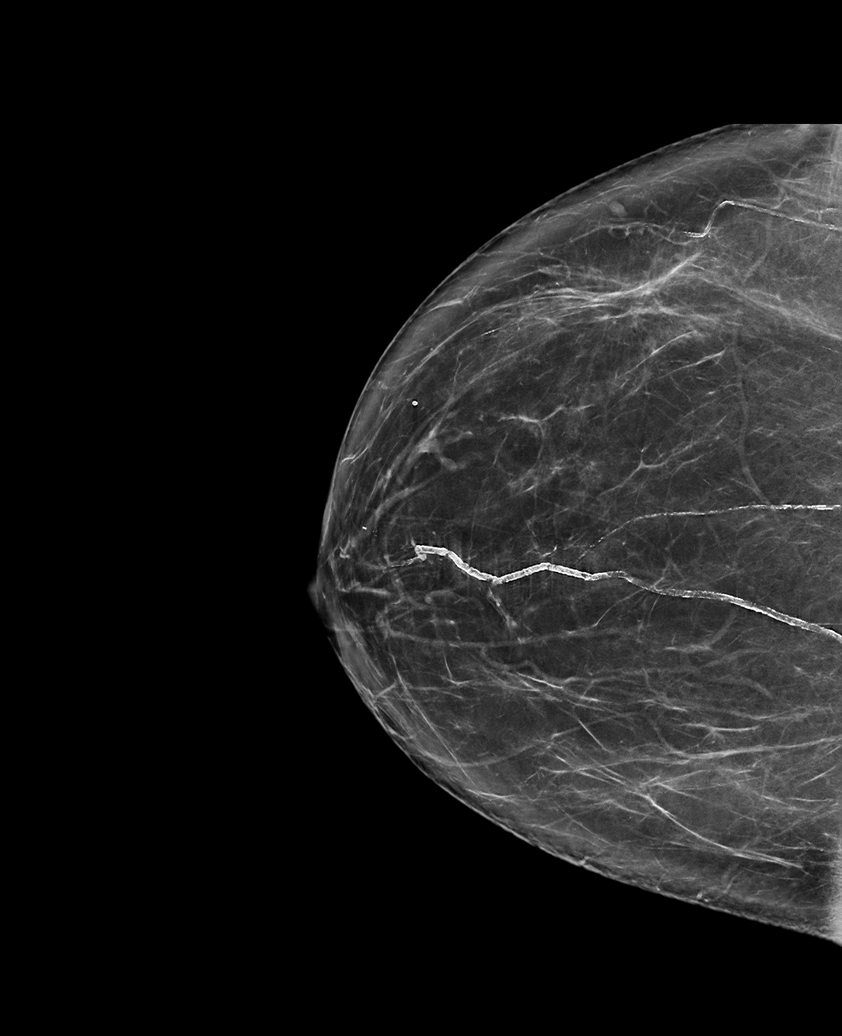

[R MLO synth-2D (2 of 2)]
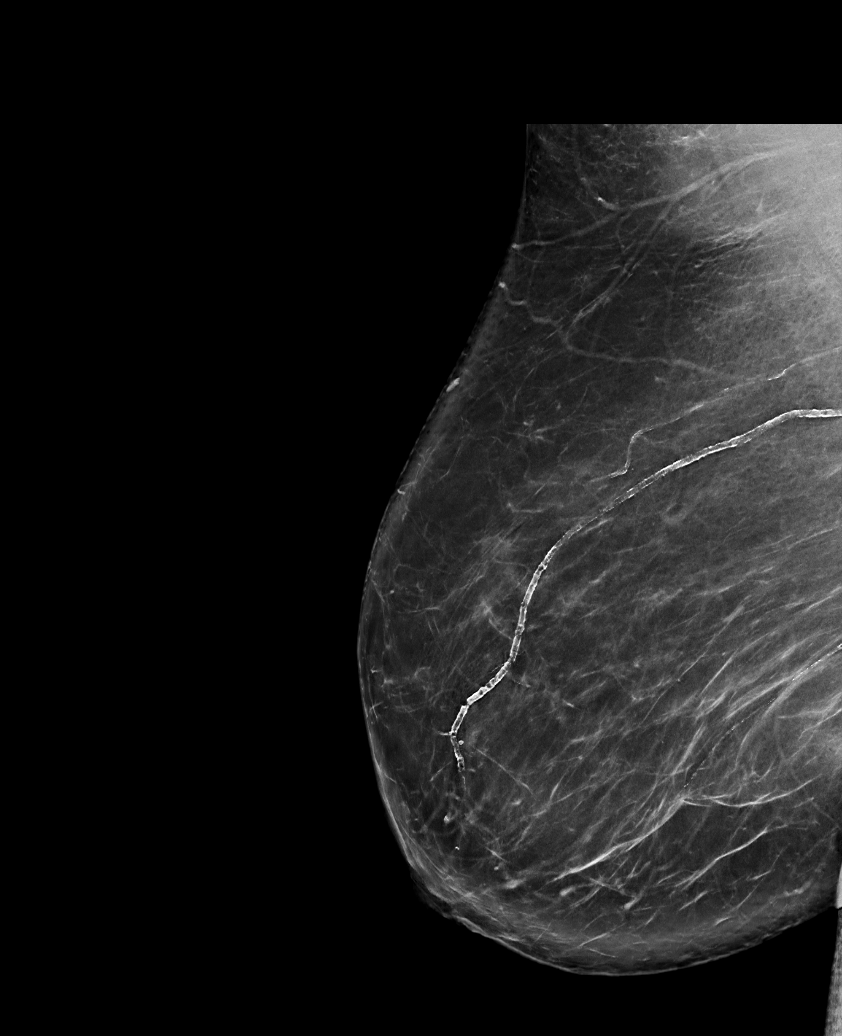

[L CC synth-2D]
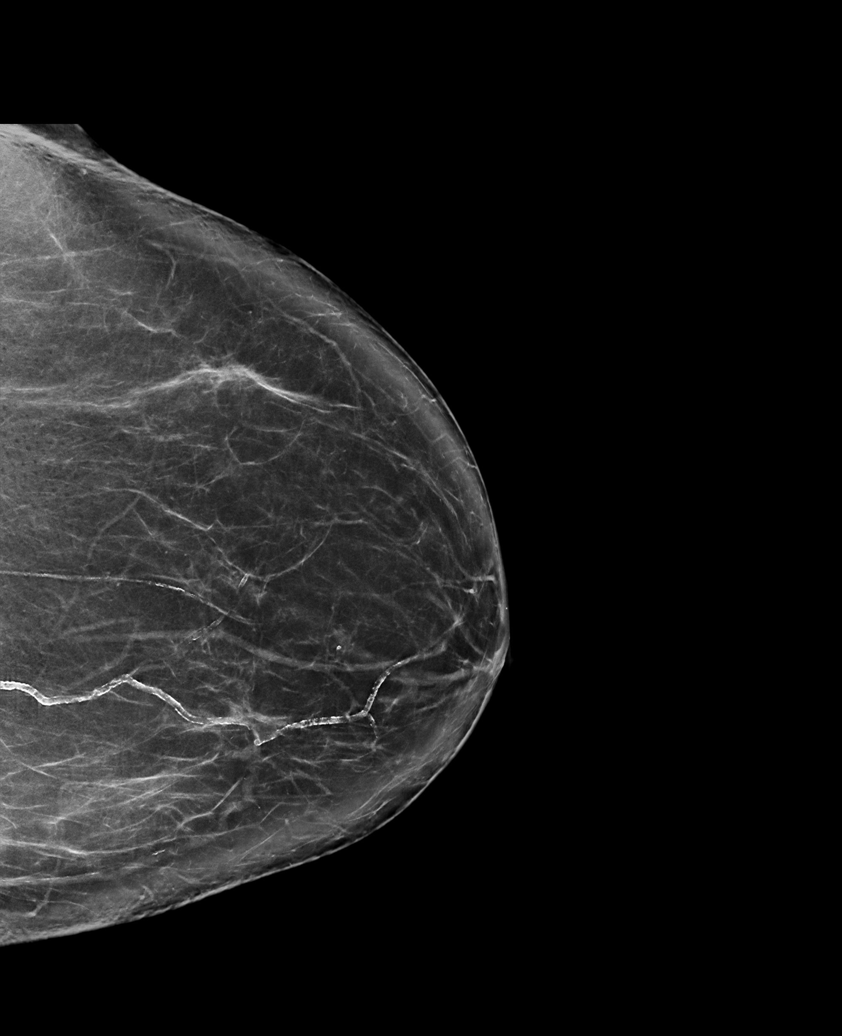

[L CC tomo · tomo slice 47/93.0]
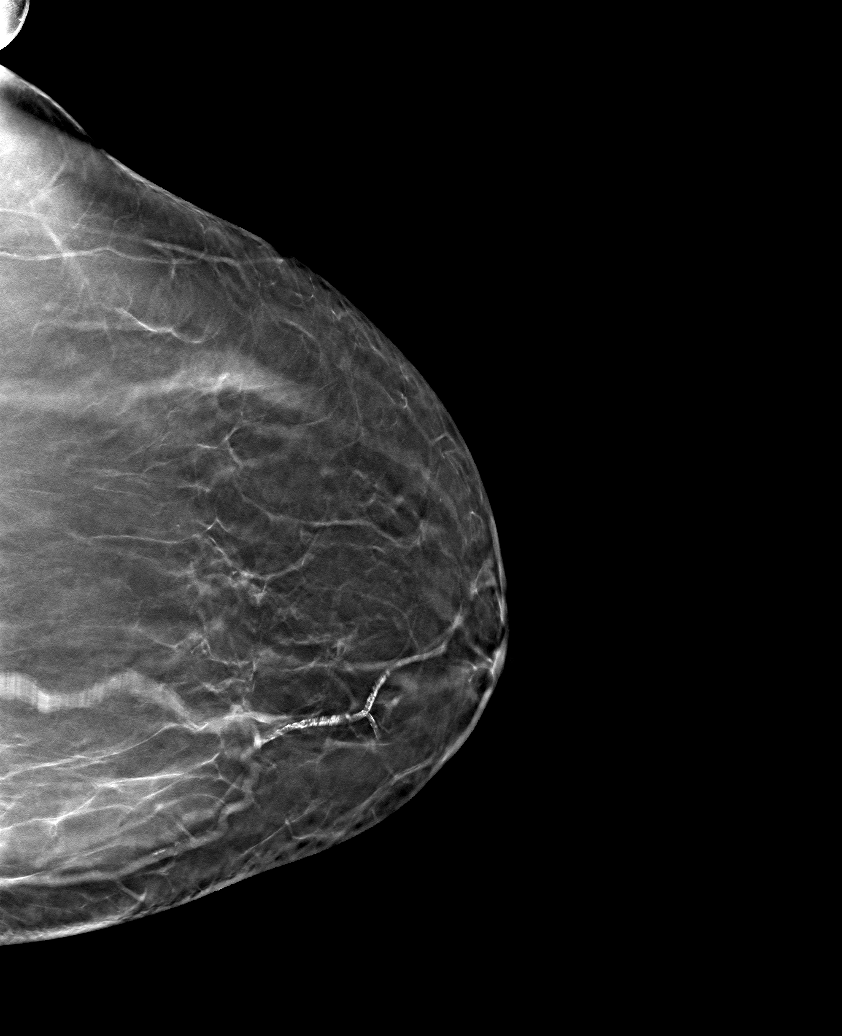

[6 of 30 positions shown; findings below may reference images not displayed]

ACR Breast Density Category b: There are scattered areas of
fibroglandular density.
FINDINGS: There are no findings suspicious for malignancy.
IMPRESSION: No mammographic evidence of malignancy. A result letter of this
screening mammogram will be mailed directly to the patient.

RECOMMENDATION:
Screening mammogram in one year. (Code:51-O-LD2)

BI-RADS CATEGORY  1: Negative.

## 2023-06-16 ENCOUNTER — Ambulatory Visit: Admitting: Family Medicine

## 2023-06-16 ENCOUNTER — Other Ambulatory Visit: Payer: Self-pay

## 2023-06-16 DIAGNOSIS — M25562 Pain in left knee: Secondary | ICD-10-CM | POA: Diagnosis not present

## 2023-06-16 DIAGNOSIS — G8929 Other chronic pain: Secondary | ICD-10-CM

## 2023-06-16 DIAGNOSIS — M25561 Pain in right knee: Secondary | ICD-10-CM | POA: Diagnosis not present

## 2023-06-16 DIAGNOSIS — M170 Bilateral primary osteoarthritis of knee: Secondary | ICD-10-CM | POA: Diagnosis not present

## 2023-06-16 MED ORDER — HYALURONAN 30 MG/2ML IX SOSY
30.0000 mg | PREFILLED_SYRINGE | Freq: Once | INTRA_ARTICULAR | Status: AC
Start: 2023-06-16 — End: 2023-06-16
  Administered 2023-06-16: 30 mg via INTRA_ARTICULAR

## 2023-06-16 NOTE — Patient Instructions (Addendum)
Thank you for coming in today.   You received an injection today. Seek immediate medical attention if the joint becomes red, extremely painful, or is oozing fluid.   We will see you next week for the 3rd Orthovisc injection. 

## 2023-06-16 NOTE — Progress Notes (Signed)
 Caroleann Casler ZOXWRUE presents to clinic today for Orthovisc injection bilateral knee 2/3 Procedure: Real-time Ultrasound Guided Injection of right knee joint superior lateral patellar space Device: Philips Affiniti 50G/GE Logiq Images permanently stored and available for review in PACS Verbal informed consent obtained.  Discussed risks and benefits of procedure. Warned about infection, bleeding, damage to structures among others. Patient expresses understanding and agreement Time-out conducted.   Noted no overlying erythema, induration, or other signs of local infection.   Skin prepped in a sterile fashion.   Local anesthesia: Topical Ethyl chloride.   With sterile technique and under real time ultrasound guidance: Orthovisc 30 mg injected into knee joint. Fluid seen entering the joint capsule.   Completed without difficulty   Advised to call if fevers/chills, erythema, induration, drainage, or persistent bleeding.   Images permanently stored and available for review in the ultrasound unit.  Impression: Technically successful ultrasound guided injection.  Procedure: Real-time Ultrasound Guided Injection of left knee joint superior lateral patellar space Device: Philips Affiniti 50G/GE Logiq Images permanently stored and available for review in PACS Verbal informed consent obtained.  Discussed risks and benefits of procedure. Warned about infection, bleeding, damage to structures among others. Patient expresses understanding and agreement Time-out conducted.   Noted no overlying erythema, induration, or other signs of local infection.   Skin prepped in a sterile fashion.   Local anesthesia: Topical Ethyl chloride.   With sterile technique and under real time ultrasound guidance: Orthovisc 30 mg injected into knee joint. Fluid seen entering the joint capsule.   Completed without difficulty   Advised to call if fevers/chills, erythema, induration, drainage, or persistent bleeding.   Images  permanently stored and available for review in the ultrasound unit.  Impression: Technically successful ultrasound guided injection. Lot number: [id number] for both knees Return in 1 week for Orthovisc injection bilateral knees 3/3

## 2023-06-23 ENCOUNTER — Other Ambulatory Visit: Payer: Self-pay

## 2023-06-23 ENCOUNTER — Ambulatory Visit: Admitting: Family Medicine

## 2023-06-23 ENCOUNTER — Encounter: Payer: Self-pay | Admitting: Family Medicine

## 2023-06-23 DIAGNOSIS — M170 Bilateral primary osteoarthritis of knee: Secondary | ICD-10-CM

## 2023-06-23 DIAGNOSIS — G8929 Other chronic pain: Secondary | ICD-10-CM

## 2023-06-23 DIAGNOSIS — M25562 Pain in left knee: Secondary | ICD-10-CM | POA: Diagnosis not present

## 2023-06-23 DIAGNOSIS — M25561 Pain in right knee: Secondary | ICD-10-CM | POA: Diagnosis not present

## 2023-06-23 MED ORDER — HYALURONAN 30 MG/2ML IX SOSY
30.0000 mg | PREFILLED_SYRINGE | Freq: Once | INTRA_ARTICULAR | Status: AC
Start: 2023-06-23 — End: 2023-06-23
  Administered 2023-06-23: 30 mg via INTRA_ARTICULAR

## 2023-06-23 NOTE — Patient Instructions (Signed)
 Thank you for coming in today.   You received an injection today. Seek immediate medical attention if the joint becomes red, extremely painful, or is oozing fluid.

## 2023-06-23 NOTE — Progress Notes (Signed)
 Mindy Anderson JXBJYNW presents to clinic today for Orthovisc injection bilateral knee 3/3 Procedure: Real-time Ultrasound Guided Injection of right knee joint superior lateral patellar space Device: Philips Affiniti 50G/GE Logiq Images permanently stored and available for review in PACS Verbal informed consent obtained.  Discussed risks and benefits of procedure. Warned about infection, bleeding, damage to structures among others. Patient expresses understanding and agreement Time-out conducted.   Noted no overlying erythema, induration, or other signs of local infection.   Skin prepped in a sterile fashion.   Local anesthesia: Topical Ethyl chloride.   With sterile technique and under real time ultrasound guidance: Orthovisc 30 mg injected into knee joint. Fluid seen entering the joint capsule.   Completed without difficulty   Advised to call if fevers/chills, erythema, induration, drainage, or persistent bleeding.   Images permanently stored and available for review in the ultrasound unit.  Impression: Technically successful ultrasound guided injection.  Procedure: Real-time Ultrasound Guided Injection of left knee joint superior lateral patellar space Device: Philips Affiniti 50G/GE Logiq Images permanently stored and available for review in PACS Verbal informed consent obtained.  Discussed risks and benefits of procedure. Warned about infection, bleeding, damage to structures among others. Patient expresses understanding and agreement Time-out conducted.   Noted no overlying erythema, induration, or other signs of local infection.   Skin prepped in a sterile fashion.   Local anesthesia: Topical Ethyl chloride.   With sterile technique and under real time ultrasound guidance: Orthovisc 30 mg injected into knee joint. Fluid seen entering the joint capsule.   Completed without difficulty   Advised to call if fevers/chills, erythema, induration, drainage, or persistent bleeding.   Images  permanently stored and available for review in the ultrasound unit.  Impression: Technically successful ultrasound guided injection. Lot number: [id number] for both injections  Return as needed.  If not improved consider either surgical consultation for total knee replacement which is the preferred option or referral to interventional radiology for genicular artery embolization. We talked about both options.  She very much would like to avoid surgery but I do think that is her best option if she can have it.

## 2023-08-22 ENCOUNTER — Ambulatory Visit (INDEPENDENT_AMBULATORY_CARE_PROVIDER_SITE_OTHER)

## 2023-08-22 VITALS — Ht 64.0 in | Wt 212.0 lb

## 2023-08-22 DIAGNOSIS — Z0000 Encounter for general adult medical examination without abnormal findings: Secondary | ICD-10-CM

## 2023-08-22 NOTE — Progress Notes (Signed)
 Subjective:   Mindy Anderson is a 73 y.o. who presents for a Medicare Wellness preventive visit.  As a reminder, Annual Wellness Visits don't include a physical exam, and some assessments may be limited, especially if this visit is performed virtually. We may recommend an in-person follow-up visit with your provider if needed.  Visit Complete: Virtual I connected with  Mindy Anderson [facility] on 08/22/23 by a audio enabled telemedicine application and verified that I am speaking with the correct person using two identifiers.  Patient Location: Home  Provider Location: Home Office  I discussed the limitations of evaluation and management by telemedicine. The patient expressed understanding and agreed to proceed.  Vital Signs: Because this visit was a virtual/telehealth visit, some criteria may be missing or patient reported. Any vitals not documented were not able to be obtained and vitals that have been documented are patient reported.    Persons Participating in Visit: Patient.  AWV Questionnaire: No: Patient Medicare AWV questionnaire was not completed prior to this visit.  Cardiac Risk Factors include: advanced age (>60men, >34 women);hypertension     Objective:     Today's Vitals   08/22/23 1559  Weight: 212 lb (96.2 kg)  Height: 5\' 4"  (1.626 m)   Body mass index is 36.39 kg/m.     08/22/2023    4:06 PM 08/18/2022    2:24 PM 08/16/2021   10:15 AM 08/06/2020    9:31 AM 05/21/2019    3:22 PM 05/17/2018   10:50 AM 04/21/2017    1:16 PM  Advanced Directives  Does Patient Have a Medical Advance Directive? No No No No No No Yes  Does patient want to make changes to medical advance directive?       No - Patient declined  Would patient like information on creating a medical advance directive? No - Patient declined No - Patient declined No - Patient declined No - Patient declined No - Patient declined No - Patient declined     Current Medications (verified) Outpatient Encounter  Medications as of 08/22/2023  Medication Sig   amLODipine  (NORVASC ) 5 MG tablet Take 1 tablet (5 mg total) by mouth daily.   aspirin 81 MG tablet Take 81 mg by mouth daily.   cholecalciferol (VITAMIN D3) 25 MCG (1000 UNIT) tablet Take 1,000 Units by mouth daily.   diclofenac  Sodium (VOLTAREN ) 1 % GEL Apply 4 g topically 4 (four) times daily.   Fluticasone Propionate (FLONASE NA) Place into the nose.   lisinopril -hydrochlorothiazide  (ZESTORETIC ) 10-12.5 MG tablet Take 1 tablet by mouth daily.   meloxicam  (MOBIC ) 7.5 MG tablet TAKE 1 TO 2 TABLETS EVERY DAY AS NEEDED   No facility-administered encounter medications on file as of 08/22/2023.    Allergies (verified) Hydrocodone  and Tramadol    History: Past Medical History:  Diagnosis Date   Arthritis    hands, knees    Complication of anesthesia    Family history of adverse reaction to anesthesia    mother of pt. wakes slowly   Hypertension    PONV (postoperative nausea and vomiting)    Past Surgical History:  Procedure Laterality Date   Band Aid surgery  1980's   BREAST BIOPSY Right 01/05/2023   US  RT BREAST BX W LOC DEV 1ST LESION IMG BX SPEC US  GUIDE 01/05/2023 [facility] MAMMOGRAPHY   CHOLECYSTECTOMY N/A 06/25/2014   Procedure: LAPAROSCOPIC CHOLECYSTECTOMY;  Surgeon: Shela Derby, MD;  Location: [facility] OR;  Service: General;  Laterality: N/A;   TUBAL LIGATION  Family History  Problem Relation Age of Onset   Uterine cancer Mother    Heart disease Mother 73       chf   Breast cancer Mother    Cervical cancer Mother    Cancer Mother        skin   COPD Father    Heart disease Father 47       MI   Heart disease Brother        cabg with stents   Cancer Brother        Skin cancer   Heart disease Brother    Heart attack Brother 81   Lung cancer Sister    Liver cancer Sister    Breast cancer Sister    Colon cancer Sister    Colon polyps Daughter    Rectal cancer Neg Hx    Stomach cancer Neg Hx    Esophageal cancer Neg Hx     Social History   Socioeconomic History   Marital status: Married    Spouse name: Not on file   Number of children: Not on file   Years of education: Not on file   Highest education level: Not on file  Occupational History   Occupation: retired  Tobacco Use   Smoking status: Never   Smokeless tobacco: Never  Substance and Sexual Activity   Alcohol use: No    Alcohol/week: 0.0 standard drinks of alcohol   Drug use: No   Sexual activity: Never    Partners: Male  Other Topics Concern   Not on file  Social History Narrative   Exercise-- no   Social Drivers of Health   Financial Resource Strain: Low Risk  (08/22/2023)   Overall Financial Resource Strain (CARDIA)    Difficulty of Paying Living Expenses: Not hard at all  Food Insecurity: No Food Insecurity (08/22/2023)   Hunger Vital Sign    Worried About Running Out of Food in the Last Year: Never true    Ran Out of Food in the Last Year: Never true  Transportation Needs: No Transportation Needs (08/22/2023)   PRAPARE - Administrator, Civil Service (Medical): No    Lack of Transportation (Non-Medical): No  Physical Activity: Insufficiently Active (08/22/2023)   Exercise Vital Sign    Days of Exercise per Week: 2 days    Minutes of Exercise per Session: 20 min  Stress: No Stress Concern Present (08/22/2023)   [employer] of Occupational Health - Occupational Stress Questionnaire    Feeling of Stress : Not at all  Social Connections: Socially Integrated (08/22/2023)   Social Connection and Isolation Panel [NHANES]    Frequency of Communication with Friends and Family: More than three times a week    Frequency of Social Gatherings with Friends and Family: More than three times a week    Attends Religious Services: More than 4 times per year    Active Member of [employer] or Organizations: Yes    Attends Engineer, structural: More than 4 times per year    Marital Status: Married    Tobacco  Counseling Counseling given: Not Answered    Clinical Intake:  Pre-visit preparation completed: Yes  Pain : No/denies pain     BMI - recorded: 36.96 Nutritional Status: BMI > 30  Obese Nutritional Risks: None Diabetes: No  No results found for: "HGBA1C"   How often do you need to have someone help you when you read instructions, pamphlets, or other written materials from  your doctor or pharmacy?: 1 - Never  Interpreter Needed?: No  Information entered by :: Farris Hong LPN   Activities of Daily Living     08/22/2023    4:03 PM  In your present state of health, do you have any difficulty performing the following activities:  Hearing? 0  Vision? 0  Difficulty concentrating or making decisions? 0  Walking or climbing stairs? 0  Dressing or bathing? 0  Doing errands, shopping? 0  Preparing Food and eating ? N  Using the Toilet? N  In the past six months, have you accidently leaked urine? N  Do you have problems with loss of bowel control? N  Managing your Medications? N  Managing your Finances? N  Housekeeping or managing your Housekeeping? N    Patient Care Team: Crecencio Dodge, Candida Chalk, DO as PCP - General Corie Diamond, MD as Consulting Physician (Ophthalmology) Salina Craver, MD as Consulting Physician (Sports Medicine)  Indicate any recent Medical Services you may have received from other than [facility] providers in the past year (date may be approximate).     Assessment:    This is a routine wellness examination for Mindy Anderson.  Hearing/Vision screen Hearing Screening - Comments:: Denies hearing difficulties   Vision Screening - Comments:: Wears reading glasses - Not up to date with routine eye exams. Deferred   Goals Addressed               This Visit's Progress     Increase physical activity (pt-stated)        Remain active.       Depression Screen     08/22/2023    4:02 PM 08/18/2022    2:29 PM 02/14/2022    9:50 AM 08/16/2021   10:16 AM  08/16/2021    9:16 AM 08/06/2020    9:34 AM 08/06/2020    8:59 AM  PHQ 2/9 Scores  PHQ - 2 Score 0 0 0 0 0 0 0    Fall Risk     08/22/2023    4:04 PM 08/18/2022    2:25 PM 08/15/2022   10:05 AM 02/14/2022    9:49 AM 08/16/2021   10:15 AM  Fall Risk   Falls in the past year? 1 0 0 0 1  Number falls in past yr: 0 0 0 1 0  Injury with Fall? 0 0 0 1 0  Risk for fall due to : No Fall Risks No Fall Risks  History of fall(s) No Fall Risks  Follow up Falls evaluation completed Falls evaluation completed Falls evaluation completed Falls evaluation completed Falls evaluation completed    MEDICARE RISK AT HOME:  Medicare Risk at Home Any stairs in or around the home?: No If so, are there any without handrails?: No Home free of loose throw rugs in walkways, pet beds, electrical cords, etc?: Yes Adequate lighting in your home to reduce risk of falls?: Yes Life alert?: No Use of a cane, walker or w/c?: No Grab bars in the bathroom?: Yes Shower chair or bench in shower?: Yes Elevated toilet seat or a handicapped toilet?: Yes  TIMED UP AND GO:  Was the test performed?  No  Cognitive Function: 6CIT completed    05/17/2018   10:50 AM 04/21/2017    1:19 PM 04/11/2016    2:18 PM  MMSE - Mini Mental State Exam  Orientation to time 5 5 5   Orientation to Place 5 5 5   Registration 3 3 3   Attention/ Calculation  4 5 5   Recall 3 2 3   Language- name 2 objects 2 2 2   Language- repeat 1 1 1   Language- follow 3 step command 3 3 3   Language- read & follow direction 1 1 1   Write a sentence 1 1 1   Copy design 1 1 1   Total score 29 29 30         08/22/2023    4:05 PM 08/18/2022    2:35 PM 08/16/2021   10:22 AM  6CIT Screen  What Year? 0 points 0 points 0 points  What month? 0 points 0 points 0 points  What time? 0 points 0 points 0 points  Count back from 20 0 points 0 points 0 points  Months in reverse 0 points 0 points 0 points  Repeat phrase 0 points 0 points 0 points  Total Score 0 points 0  points 0 points    Immunizations Immunization History  Administered Date(s) Administered   Fluad Quad(high Dose 65+) 12/31/2018, 01/23/2020, 01/26/2021, 02/14/2022   Influenza Inj Mdck Quad Pf 01/13/2018   Influenza Split 02/09/2011   Influenza Whole 01/09/2008, 03/11/2009, 03/09/2010, 02/03/2012   Influenza,inj,Quad PF,6+ Mos 01/15/2014, 01/09/2015   Influenza-Unspecified 01/29/2016, 02/22/2023   PFIZER Comirnaty(Gray Top)Covid-19 Tri-Sucrose Vaccine 08/06/2020   PFIZER(Purple Top)SARS-COV-2 Vaccination 04/27/2019, 05/18/2019, 01/29/2020   Pneumococcal Conjugate-13 10/21/2014   Pneumococcal Polysaccharide-23 10/17/2013   Td 02/11/2008   Tdap 02/14/2022   Zoster Recombinant(Shingrix) 01/13/2018, 04/20/2018   Zoster, Live 05/18/2009    Screening Tests Health Maintenance  Topic Date Due   COVID-19 Vaccine (5 - 2024-25 season) 12/04/2022   INFLUENZA VACCINE  11/03/2023   Colonoscopy  12/27/2023   Medicare Annual Wellness (AWV)  08/21/2024   MAMMOGRAM  12/28/2024   DTaP/Tdap/Td (3 - Td or Tdap) 02/15/2032   Pneumonia Vaccine 46+ Years old  Completed   DEXA SCAN  Completed   Hepatitis C Screening  Completed   Zoster Vaccines- Shingrix  Completed   HPV VACCINES  Aged Out   Meningococcal B Vaccine  Aged Out    Health Maintenance  Health Maintenance Due  Topic Date Due   COVID-19 Vaccine (5 - 2024-25 season) 12/04/2022   Health Maintenance Items Addressed:   Additional Screening:  Vision Screening: Recommended annual ophthalmology exams for early detection of glaucoma and other disorders of the eye.  Dental Screening: Recommended annual dental exams for proper oral hygiene  Community Resource Referral / Chronic Care Management: CRR required this visit?  No   CCM required this visit?  No   Plan:    I have personally reviewed and noted the following in the patient's chart:   Medical and social history Use of alcohol, tobacco or illicit drugs  Current  medications and supplements including opioid prescriptions. Patient is not currently taking opioid prescriptions. Functional ability and status Nutritional status Physical activity Advanced directives List of other physicians Hospitalizations, surgeries, and ER visits in previous 12 months Vitals Screenings to include cognitive, depression, and falls Referrals and appointments  In addition, I have reviewed and discussed with patient certain preventive protocols, quality metrics, and best practice recommendations. A written personalized care plan for preventive services as well as general preventive health recommendations were provided to patient.   Dewayne Ford, LPN   1/61/0960   After Visit Summary: (MyChart) Due to this being a telephonic visit, the after visit summary with patients personalized plan was offered to patient via MyChart   Notes: Nothing significant to report at this time.

## 2023-08-22 NOTE — Patient Instructions (Addendum)
 Mindy Anderson , Thank you for taking time out of your busy schedule to complete your Annual Wellness Visit with me. I enjoyed our conversation and look forward to speaking with you again next year. I, as well as your care team,  appreciate your ongoing commitment to your health goals. Please review the following plan we discussed and let me know if I can assist you in the future. Your Game plan/ To Do List    Referrals: If you haven't heard from the office you've been referred to, please reach out to them at the phone provided.   Follow up Visits: Next Medicare AWV with our clinical staff: 08/28/24 @ 1:10p   Have you seen your provider in the last 6 months (3 months if uncontrolled diabetes)?  Next Office Visit with your provider: 08/25/23 @ 10:20a  Clinician Recommendations:  Aim for 30 minutes of exercise or brisk walking, 6-8 glasses of water, and 5 servings of fruits and vegetables each day.       This is a list of the screening recommended for you and due dates:  Health Maintenance  Topic Date Due   COVID-19 Vaccine (5 - 2024-25 season) 12/04/2022   Flu Shot  11/03/2023   Colon Cancer Screening  12/27/2023   Medicare Annual Wellness Visit  08/21/2024   Mammogram  12/28/2024   DTaP/Tdap/Td vaccine (3 - Td or Tdap) 02/15/2032   Pneumonia Vaccine  Completed   DEXA scan (bone density measurement)  Completed   Hepatitis C Screening  Completed   Zoster (Shingles) Vaccine  Completed   HPV Vaccine  Aged Out   Meningitis B Vaccine  Aged Out    Advanced directives: (Declined) Advance directive discussed with you today. Even though you declined this today, please call our office should you change your mind, and we can give you the proper paperwork for you to fill out. Advance Care Planning is important because it:  [x]  Makes sure you receive the medical care that is consistent with your values, goals, and preferences  [x]  It provides guidance to your family and loved ones and reduces their  decisional burden about whether or not they are making the right decisions based on your wishes.  Follow the link provided in your after visit summary or read over the paperwork we have mailed to you to help you started getting your Advance Directives in place. If you need assistance in completing these, please reach out to us  so that we can help you!  See attachments for Preventive Care and Fall Prevention Tips.

## 2023-08-25 ENCOUNTER — Ambulatory Visit (INDEPENDENT_AMBULATORY_CARE_PROVIDER_SITE_OTHER): Payer: Medicare HMO | Admitting: Family Medicine

## 2023-08-25 ENCOUNTER — Encounter: Payer: Self-pay | Admitting: Family Medicine

## 2023-08-25 VITALS — BP 138/88 | HR 71 | Temp 97.9°F | Resp 18 | Ht 64.0 in | Wt 207.2 lb

## 2023-08-25 DIAGNOSIS — E559 Vitamin D deficiency, unspecified: Secondary | ICD-10-CM | POA: Diagnosis not present

## 2023-08-25 DIAGNOSIS — I10 Essential (primary) hypertension: Secondary | ICD-10-CM

## 2023-08-25 LAB — CBC WITH DIFFERENTIAL/PLATELET
Basophils Absolute: 0 10*3/uL (ref 0.0–0.1)
Basophils Relative: 0.6 % (ref 0.0–3.0)
Eosinophils Absolute: 0.2 10*3/uL (ref 0.0–0.7)
Eosinophils Relative: 3 % (ref 0.0–5.0)
HCT: 41.1 % (ref 36.0–46.0)
Hemoglobin: 13.6 g/dL (ref 12.0–15.0)
Lymphocytes Relative: 28.5 % (ref 12.0–46.0)
Lymphs Abs: 1.7 10*3/uL (ref 0.7–4.0)
MCHC: 33.1 g/dL (ref 30.0–36.0)
MCV: 90.8 fl (ref 78.0–100.0)
Monocytes Absolute: 0.4 10*3/uL (ref 0.1–1.0)
Monocytes Relative: 7.1 % (ref 3.0–12.0)
Neutro Abs: 3.7 10*3/uL (ref 1.4–7.7)
Neutrophils Relative %: 60.8 % (ref 43.0–77.0)
Platelets: 209 10*3/uL (ref 150.0–400.0)
RBC: 4.53 Mil/uL (ref 3.87–5.11)
RDW: 13.7 % (ref 11.5–15.5)
WBC: 6 10*3/uL (ref 4.0–10.5)

## 2023-08-25 LAB — COMPREHENSIVE METABOLIC PANEL WITH GFR
ALT: 14 U/L (ref 0–35)
AST: 17 U/L (ref 0–37)
Albumin: 4 g/dL (ref 3.5–5.2)
Alkaline Phosphatase: 77 U/L (ref 39–117)
BUN: 11 mg/dL (ref 6–23)
CO2: 31 meq/L (ref 19–32)
Calcium: 9.5 mg/dL (ref 8.4–10.5)
Chloride: 103 meq/L (ref 96–112)
Creatinine, Ser: 0.68 mg/dL (ref 0.40–1.20)
GFR: 85.51 mL/min (ref >60.00)
Glucose, Bld: 108 mg/dL — ABNORMAL HIGH (ref 70–99)
Potassium: 4.7 meq/L (ref 3.5–5.1)
Sodium: 142 meq/L (ref 135–145)
Total Bilirubin: 0.6 mg/dL (ref 0.2–1.2)
Total Protein: 7.1 g/dL (ref 6.0–8.3)

## 2023-08-25 LAB — LIPID PANEL
Cholesterol: 153 mg/dL (ref 0–200)
HDL: 48.9 mg/dL (ref >39.00)
LDL Cholesterol: 92 mg/dL (ref 0–99)
NonHDL: 104.31
Total CHOL/HDL Ratio: 3
Triglycerides: 64 mg/dL (ref 0.0–149.0)
VLDL: 12.8 mg/dL (ref 0.0–40.0)

## 2023-08-25 LAB — VITAMIN D 25 HYDROXY (VIT D DEFICIENCY, FRACTURES): VITD: 33.49 ng/mL (ref 30.00–100.00)

## 2023-08-25 LAB — TSH: TSH: 2.15 u[IU]/mL (ref 0.35–5.50)

## 2023-08-25 NOTE — Assessment & Plan Note (Signed)
 Well controlled, no changes to meds. Encouraged heart healthy diet such as the DASH diet and exercise as tolerated.

## 2023-08-25 NOTE — Progress Notes (Signed)
 Established Patient Office Visit  Subjective   Patient ID: Mindy Anderson, female    DOB: [birth year]  Age: 73 y.o. MRN: [id number]  Chief Complaint  Patient presents with   Hypertension   Follow-up    HPI Discussed the use of AI scribe software for clinical note transcription with the patient, who gave verbal consent to proceed.  History of Present Illness Mindy Anderson is a 73 year old female who presents for follow-up on her knee condition and medication management.  She experiences ongoing knee pain, which persists despite previous treatments with gel applications. She is not following any new treatment regimen due to being busy with activities such as bingo and exercise at the community center.  She participates in light exercise classes designed for older adults, including seated jumping jacks and standing exercises with a chair for support. Her knees do not significantly hinder her ability to exercise, although she experiences some balance issues, particularly when bending over and standing up slowly. She uses a chair for support to maintain balance.  She uses Voltaren  gel for her knee pain, applying it at least twice a day. She has not refilled her meloxicam  prescription in a long time and tries to manage without it.  She notes slight swelling in her ankles. Her blood pressure was borderline today, but she has not taken her medication yet as she has not eaten and is concerned about taking it on an empty stomach.   Patient Active Problem List   Diagnosis Date Noted   Vaginal bleeding 11/29/2022   Primary osteoarthritis of both knees 08/15/2022   Balance problem 08/15/2022   Chronic pain of right ankle 08/16/2021   Chest pain 08/16/2021   COVID-19 08/20/2020   Bronchitis 08/20/2020   Chronic pain of both knees 08/06/2020   Right hip pain 01/23/2020   Low back pain with radiation 01/23/2020   Diarrhea 12/31/2018   Preventative health care 04/11/2016   Vaginitis and  vulvovaginitis 11/03/2014   UNSPECIFIED VITAMIN D  DEFICIENCY 05/13/2009   ACTINIC KERATOSIS 02/11/2008   POSTMENOPAUSAL STATUS 02/11/2008   Primary hypertension 09/22/2006   Past Medical History:  Diagnosis Date   Arthritis    hands, knees    Complication of anesthesia    Family history of adverse reaction to anesthesia    mother of pt. wakes slowly   Hypertension    PONV (postoperative nausea and vomiting)    Past Surgical History:  Procedure Laterality Date   Band Aid surgery  1980's   BREAST BIOPSY Right 01/05/2023   US  RT BREAST BX W LOC DEV 1ST LESION IMG BX SPEC US  GUIDE 01/05/2023 [facility] MAMMOGRAPHY   CHOLECYSTECTOMY N/A 06/25/2014   Procedure: LAPAROSCOPIC CHOLECYSTECTOMY;  Surgeon: Shela Derby, MD;  Location: [facility] OR;  Service: General;  Laterality: N/A;   TUBAL LIGATION     Social History   Tobacco Use   Smoking status: Never   Smokeless tobacco: Never  Substance Use Topics   Alcohol use: No    Alcohol/week: 0.0 standard drinks of alcohol   Drug use: No   Social History   Socioeconomic History   Marital status: Married    Spouse name: Not on file   Number of children: Not on file   Years of education: Not on file   Highest education level: Not on file  Occupational History   Occupation: retired  Tobacco Use   Smoking status: Never   Smokeless tobacco: Never  Substance and Sexual Activity   Alcohol  use: No    Alcohol/week: 0.0 standard drinks of alcohol   Drug use: No   Sexual activity: Never    Partners: Male  Other Topics Concern   Not on file  Social History Narrative   Exercise-- no   Social Drivers of Health   Financial Resource Strain: Low Risk  (08/22/2023)   Overall Financial Resource Strain (CARDIA)    Difficulty of Paying Living Expenses: Not hard at all  Food Insecurity: No Food Insecurity (08/22/2023)   Hunger Vital Sign    Worried About Running Out of Food in the Last Year: Never true    Ran Out of Food in the Last Year: Never true   Transportation Needs: No Transportation Needs (08/22/2023)   PRAPARE - Administrator, Civil Service (Medical): No    Lack of Transportation (Non-Medical): No  Physical Activity: Insufficiently Active (08/22/2023)   Exercise Vital Sign    Days of Exercise per Week: 2 days    Minutes of Exercise per Session: 20 min  Stress: No Stress Concern Present (08/22/2023)   [employer] of Occupational Health - Occupational Stress Questionnaire    Feeling of Stress : Not at all  Social Connections: Socially Integrated (08/22/2023)   Social Connection and Isolation Panel [NHANES]    Frequency of Communication with Friends and Family: More than three times a week    Frequency of Social Gatherings with Friends and Family: More than three times a week    Attends Religious Services: More than 4 times per year    Active Member of [employer] or Organizations: Yes    Attends Engineer, structural: More than 4 times per year    Marital Status: Married  Catering manager Violence: Not At Risk (08/22/2023)   Humiliation, Afraid, Rape, and Kick questionnaire    Fear of Current or Ex-Partner: No    Emotionally Abused: No    Physically Abused: No    Sexually Abused: No   Family Status  Relation Name Status   Mother  Deceased   Father  Deceased at age 44       copd   Brother Hewitt Lou Alive   Brother Marketing executive Alive   Sister  Deceased   Daughter  (Not Specified)   Neg Hx  (Not Specified)  No partnership data on file   Family History  Problem Relation Age of Onset   Uterine cancer Mother    Heart disease Mother 9       chf   Breast cancer Mother    Cervical cancer Mother    Cancer Mother        skin   COPD Father    Heart disease Father 34       MI   Heart disease Brother        cabg with stents   Cancer Brother        Skin cancer   Heart disease Brother    Heart attack Brother 46   Lung cancer Sister    Liver cancer Sister    Breast cancer Sister    Colon cancer Sister     Colon polyps Daughter    Rectal cancer Neg Hx    Stomach cancer Neg Hx    Esophageal cancer Neg Hx    Allergies  Allergen Reactions   Hydrocodone  Itching and Nausea And Vomiting   Tramadol  Nausea Only      Review of Systems  Constitutional:  Negative for fever and malaise/fatigue.  HENT:  Negative for congestion.   Eyes:  Negative for blurred vision.  Respiratory:  Negative for shortness of breath.   Cardiovascular:  Negative for chest pain, palpitations and leg swelling.  Gastrointestinal:  Negative for abdominal pain, blood in stool and nausea.  Genitourinary:  Negative for dysuria and frequency.  Musculoskeletal:  Positive for joint pain. Negative for falls.  Skin:  Negative for rash.  Neurological:  Negative for dizziness, loss of consciousness and headaches.  Endo/Heme/Allergies:  Negative for environmental allergies.  Psychiatric/Behavioral:  Negative for depression. The patient is not nervous/anxious.       Objective:     BP 138/88 (BP Location: Left Arm, Patient Position: Sitting, Cuff Size: Large)   Pulse 71   Temp 97.9 F (36.6 C) (Oral)   Resp 18   Ht 5\' 4"  (1.626 m)   Wt 207 lb 3.2 oz (94 kg)   SpO2 99%   BMI 35.57 kg/m  BP Readings from Last 3 Encounters:  08/25/23 138/88  06/09/23 130/80  02/24/23 130/80   Wt Readings from Last 3 Encounters:  08/25/23 207 lb 3.2 oz (94 kg)  08/22/23 212 lb (96.2 kg)  06/09/23 212 lb (96.2 kg)   SpO2 Readings from Last 3 Encounters:  08/25/23 99%  06/09/23 97%  02/24/23 97%      Physical Exam Vitals and nursing note reviewed.  Constitutional:      General: She is not in acute distress.    Appearance: Normal appearance. She is well-developed.  HENT:     Head: Normocephalic and atraumatic.  Eyes:     General: No scleral icterus.       Right eye: No discharge.        Left eye: No discharge.  Cardiovascular:     Rate and Rhythm: Normal rate and regular rhythm.     Heart sounds: No murmur  heard. Pulmonary:     Effort: Pulmonary effort is normal. No respiratory distress.     Breath sounds: Normal breath sounds.  Musculoskeletal:        General: Normal range of motion.     Cervical back: Normal range of motion and neck supple.     Right lower leg: No edema.     Left lower leg: No edema.  Skin:    General: Skin is warm and dry.  Neurological:     Mental Status: She is alert and oriented to person, place, and time.  Psychiatric:        Mood and Affect: Mood normal.        Behavior: Behavior normal.        Thought Content: Thought content normal.        Judgment: Judgment normal.      No results found for any visits on 08/25/23.  Last CBC Lab Results  Component Value Date   WBC 7.8 02/24/2023   HGB 14.6 02/24/2023   HCT 44.3 02/24/2023   MCV 93.2 02/24/2023   MCH 29.3 04/21/2017   RDW 13.8 02/24/2023   PLT 237.0 02/24/2023   Last metabolic panel Lab Results  Component Value Date   GLUCOSE 110 (H) 02/24/2023   NA 142 02/24/2023   K 4.3 02/24/2023   CL 102 02/24/2023   CO2 33 (H) 02/24/2023   BUN 12 02/24/2023   CREATININE 0.73 02/24/2023   GFR 81.03 02/24/2023   CALCIUM 9.4 02/24/2023   PROT 7.1 02/24/2023   ALBUMIN 4.0 02/24/2023   BILITOT 0.5 02/24/2023   ALKPHOS 82 02/24/2023  AST 17 02/24/2023   ALT 15 02/24/2023   ANIONGAP 9 06/19/2014   Last lipids Lab Results  Component Value Date   CHOL 177 02/24/2023   HDL 49.50 02/24/2023   LDLCALC 114 (H) 02/24/2023   TRIG 70.0 02/24/2023   CHOLHDL 4 02/24/2023   Last hemoglobin A1c No results found for: "HGBA1C" Last thyroid functions Lab Results  Component Value Date   TSH 2.40 02/24/2023   Last vitamin D  Lab Results  Component Value Date   VD25OH 31.93 02/24/2023   Last vitamin B12 and Folate No results found for: "VITAMINB12", "FOLATE"  The 10-year ASCVD risk score (Arnett DK, et al., 2019) is: 21.5%    Assessment & Plan:   Problem List Items Addressed This Visit        Unprioritized   UNSPECIFIED VITAMIN D  DEFICIENCY - Primary   Relevant Orders   VITAMIN D  25 Hydroxy (Vit-D Deficiency, Fractures)   Primary hypertension   Well controlled, no changes to meds. Encouraged heart healthy diet such as the DASH diet and exercise as tolerated.        Relevant Orders   CBC with Differential/Platelet   Comprehensive metabolic panel with GFR   Lipid panel   TSH  Assessment and Plan Assessment & Plan Knee pain secondary to osteoarthritis   Chronic knee pain is likely due to osteoarthritis. She uses Voltaren  gel twice daily for pain management and engages in light exercise and community activities, which may help maintain joint function. No recent use of meloxicam  indicates manageable pain levels. Continue Voltaren  gel twice daily and encourage continuation of light exercise and community activities.  Hypertension   Blood pressure is borderline today. She has not taken medication due to an empty stomach. Exercise and community activities may contribute to overall cardiovascular health. Advise taking blood pressure medication with food and monitor blood pressure regularly.    No follow-ups on file.    Jakeim Sedore R Lowne Chase, DO

## 2023-09-03 ENCOUNTER — Ambulatory Visit: Payer: Self-pay | Admitting: Family Medicine

## 2023-11-21 ENCOUNTER — Other Ambulatory Visit: Payer: Self-pay | Admitting: Family Medicine

## 2023-11-21 DIAGNOSIS — I10 Essential (primary) hypertension: Secondary | ICD-10-CM

## 2024-02-26 ENCOUNTER — Ambulatory Visit: Admitting: Family Medicine

## 2024-02-26 ENCOUNTER — Encounter: Payer: Self-pay | Admitting: Family Medicine

## 2024-02-26 VITALS — BP 122/70 | HR 76 | Temp 98.1°F | Resp 16 | Ht 64.0 in | Wt 200.6 lb

## 2024-02-26 DIAGNOSIS — Z1211 Encounter for screening for malignant neoplasm of colon: Secondary | ICD-10-CM

## 2024-02-26 DIAGNOSIS — E559 Vitamin D deficiency, unspecified: Secondary | ICD-10-CM

## 2024-02-26 DIAGNOSIS — I10 Essential (primary) hypertension: Secondary | ICD-10-CM | POA: Diagnosis not present

## 2024-02-26 LAB — COMPREHENSIVE METABOLIC PANEL WITH GFR
ALT: 15 U/L (ref 0–35)
AST: 16 U/L (ref 0–37)
Albumin: 4.2 g/dL (ref 3.5–5.2)
Alkaline Phosphatase: 78 U/L (ref 39–117)
BUN: 15 mg/dL (ref 6–23)
CO2: 34 meq/L — ABNORMAL HIGH (ref 19–32)
Calcium: 9.5 mg/dL (ref 8.4–10.5)
Chloride: 101 meq/L (ref 96–112)
Creatinine, Ser: 0.74 mg/dL (ref 0.40–1.20)
GFR: 79.16 mL/min (ref >60.00)
Glucose, Bld: 115 mg/dL — ABNORMAL HIGH (ref 70–99)
Potassium: 4.2 meq/L (ref 3.5–5.1)
Sodium: 141 meq/L (ref 135–145)
Total Bilirubin: 0.6 mg/dL (ref 0.2–1.2)
Total Protein: 7.3 g/dL (ref 6.0–8.3)

## 2024-02-26 LAB — LIPID PANEL
Cholesterol: 160 mg/dL (ref 0–200)
HDL: 52.4 mg/dL (ref >39.00)
LDL Cholesterol: 89 mg/dL (ref 0–99)
NonHDL: 107.97
Total CHOL/HDL Ratio: 3
Triglycerides: 93 mg/dL (ref 0.0–149.0)
VLDL: 18.6 mg/dL (ref 0.0–40.0)

## 2024-02-26 LAB — VITAMIN D 25 HYDROXY (VIT D DEFICIENCY, FRACTURES): VITD: 33.97 ng/mL (ref 30.00–100.00)

## 2024-02-26 NOTE — Assessment & Plan Note (Signed)
 Well controlled, no changes to meds. Encouraged heart healthy diet such as the DASH diet and exercise as tolerated.

## 2024-02-26 NOTE — Patient Instructions (Signed)
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
## 2024-03-13 ENCOUNTER — Ambulatory Visit: Payer: Self-pay | Admitting: Family Medicine

## 2024-08-12 ENCOUNTER — Encounter: Admitting: Family Medicine

## 2024-08-28 ENCOUNTER — Ambulatory Visit
